# Patient Record
Sex: Male | Born: 1950
Health system: Southern US, Community
[De-identification: ages and names within clinical notes are randomized; demographics above are authoritative.]

## PROBLEM LIST (undated history)

## (undated) ENCOUNTER — Emergency Department (HOSPITAL_COMMUNITY): Admission: EM | Discharge status: Absent without leave | Payer: Medicare Other

## (undated) DIAGNOSIS — N200 Calculus of kidney: Secondary | ICD-10-CM

## (undated) DIAGNOSIS — S21109A Unspecified open wound of unspecified front wall of thorax without penetration into thoracic cavity, initial encounter: Secondary | ICD-10-CM

## (undated) DIAGNOSIS — Z87442 Personal history of urinary calculi: Secondary | ICD-10-CM

## (undated) DIAGNOSIS — G4733 Obstructive sleep apnea (adult) (pediatric): Principal | ICD-10-CM

## (undated) DIAGNOSIS — S2190XA Unspecified open wound of unspecified part of thorax, initial encounter: Secondary | ICD-10-CM

## (undated) DIAGNOSIS — Z87891 Personal history of nicotine dependence: Secondary | ICD-10-CM

## (undated) DIAGNOSIS — I509 Heart failure, unspecified: Secondary | ICD-10-CM

## (undated) HISTORY — PX: CARDIAC CATHETERIZATION: SHX172

## (undated) HISTORY — PX: OTHER SURGICAL HISTORY: SHX169

## (undated) HISTORY — DX: Obstructive sleep apnea (adult) (pediatric): G47.33

## (undated) HISTORY — PX: DENTAL SURGERY: SHX609

---

## 2017-10-13 ENCOUNTER — Inpatient Hospital Stay (HOSPITAL_COMMUNITY)
Admission: EM | Admit: 2017-10-13 | Discharge: 2017-10-21 | DRG: 287 | Disposition: A | Discharge status: Home / Self Care | Payer: Medicare Other | Attending: Internal Medicine | Admitting: Internal Medicine

## 2017-10-13 ENCOUNTER — Emergency Department (HOSPITAL_COMMUNITY): Payer: Medicare Other

## 2017-10-13 ENCOUNTER — Encounter (HOSPITAL_COMMUNITY): Payer: Self-pay | Admitting: Emergency Medicine

## 2017-10-13 DIAGNOSIS — R079 Chest pain, unspecified: Secondary | ICD-10-CM | POA: Diagnosis not present

## 2017-10-13 DIAGNOSIS — K7460 Unspecified cirrhosis of liver: Secondary | ICD-10-CM | POA: Diagnosis present

## 2017-10-13 DIAGNOSIS — E669 Obesity, unspecified: Secondary | ICD-10-CM | POA: Diagnosis not present

## 2017-10-13 DIAGNOSIS — Z87891 Personal history of nicotine dependence: Secondary | ICD-10-CM | POA: Diagnosis not present

## 2017-10-13 DIAGNOSIS — I272 Pulmonary hypertension, unspecified: Secondary | ICD-10-CM | POA: Diagnosis present

## 2017-10-13 DIAGNOSIS — R0602 Shortness of breath: Secondary | ICD-10-CM | POA: Diagnosis not present

## 2017-10-13 DIAGNOSIS — I5043 Acute on chronic combined systolic (congestive) and diastolic (congestive) heart failure: Secondary | ICD-10-CM | POA: Diagnosis not present

## 2017-10-13 DIAGNOSIS — I959 Hypotension, unspecified: Secondary | ICD-10-CM | POA: Diagnosis present

## 2017-10-13 DIAGNOSIS — R000 Tachycardia, unspecified: Secondary | ICD-10-CM | POA: Diagnosis not present

## 2017-10-13 DIAGNOSIS — I428 Other cardiomyopathies: Secondary | ICD-10-CM | POA: Diagnosis present

## 2017-10-13 DIAGNOSIS — Z6841 Body Mass Index (BMI) 40.0 and over, adult: Secondary | ICD-10-CM

## 2017-10-13 DIAGNOSIS — Z7982 Long term (current) use of aspirin: Secondary | ICD-10-CM

## 2017-10-13 DIAGNOSIS — R062 Wheezing: Secondary | ICD-10-CM | POA: Diagnosis not present

## 2017-10-13 DIAGNOSIS — R0789 Other chest pain: Secondary | ICD-10-CM | POA: Diagnosis not present

## 2017-10-13 DIAGNOSIS — I110 Hypertensive heart disease with heart failure: Secondary | ICD-10-CM | POA: Diagnosis not present

## 2017-10-13 DIAGNOSIS — R0689 Other abnormalities of breathing: Secondary | ICD-10-CM | POA: Diagnosis not present

## 2017-10-13 DIAGNOSIS — I509 Heart failure, unspecified: Secondary | ICD-10-CM

## 2017-10-13 DIAGNOSIS — R0683 Snoring: Secondary | ICD-10-CM | POA: Diagnosis present

## 2017-10-13 HISTORY — DX: Unspecified open wound of unspecified front wall of thorax without penetration into thoracic cavity, initial encounter: S21.109A

## 2017-10-13 HISTORY — DX: Unspecified open wound of unspecified part of thorax, initial encounter: S21.90XA

## 2017-10-13 HISTORY — DX: Calculus of kidney: N20.0

## 2017-10-13 HISTORY — DX: Personal history of nicotine dependence: Z87.891

## 2017-10-13 LAB — BASIC METABOLIC PANEL
Anion gap: 8 (ref 5–15)
BUN: 9 mg/dL (ref 8–23)
CALCIUM: 8.8 mg/dL — AB (ref 8.9–10.3)
CO2: 24 mmol/L (ref 22–32)
Chloride: 107 mmol/L (ref 98–111)
Creatinine, Ser: 0.87 mg/dL (ref 0.61–1.24)
GFR calc Af Amer: >60 mL/min (ref >60)
GLUCOSE: 137 mg/dL — AB (ref 70–99)
Potassium: 4.6 mmol/L (ref 3.5–5.1)
SODIUM: 139 mmol/L (ref 135–145)

## 2017-10-13 LAB — CBC
HCT: 44.7 % (ref 39.0–52.0)
Hemoglobin: 14.2 g/dL (ref 13.0–17.0)
MCH: 30.5 pg (ref 26.0–34.0)
MCHC: 31.8 g/dL (ref 30.0–36.0)
MCV: 95.9 fL (ref 78.0–100.0)
Platelets: 211 10*3/uL (ref 150–400)
RBC: 4.66 MIL/uL (ref 4.22–5.81)
RDW: 12.9 % (ref 11.5–15.5)
WBC: 6.7 10*3/uL (ref 4.0–10.5)

## 2017-10-13 LAB — I-STAT TROPONIN, ED: TROPONIN I, POC: 0.05 ng/mL (ref 0.00–0.08)

## 2017-10-13 NOTE — ED Provider Notes (Signed)
[facility] EMERGENCY DEPARTMENT Provider Note   CSN: [id number] Arrival date & time: 10/13/17  2246     History   Chief Complaint Chief Complaint  Patient presents with  . Chest Pain    HPI David Castaneda is a 67 y.o. male.  This patient is a 67 year old male with no significant past medical history.  He presents today for evaluation of chest discomfort.  He states that this is been occurring intermittently all week, then worsened this evening.  He describes a pressure in the front of his chest with associated shortness of breath, but no fever or cough.  He denies any pleuritic component.  He denies any recent exertional symptoms.  Patient tells me he has not seen a physician in nearly 30 years.  He is on no medications and has no cardiac risk factors.  He was given nitroglycerin with little relief, but did get significant relief with morphine by EMS.  The history is provided by the patient.  Chest Pain   This is a new problem. Episode onset: 1 week ago. Episode frequency: Intermittently. The problem has been gradually worsening. The pain is present in the substernal region. The pain is moderate. The quality of the pain is described as pressure-like. The pain does not radiate. Associated symptoms include shortness of breath. Pertinent negatives include no cough, no diaphoresis, no fever and no nausea. He has tried nothing for the symptoms. The treatment provided no relief. There are no known risk factors.  Pertinent negatives for past medical history include no CAD.    Past Medical History:  Diagnosis Date  . Sucking chest wound     There are no active problems to display for this patient.   History reviewed. No pertinent surgical history.      Home Medications    Prior to Admission medications   Not on File    Family History No family history on file.  Social History Social History   Tobacco Use  . Smoking status: Former Research scientist (life sciences)  . Smokeless  tobacco: Never Used  Substance Use Topics  . Alcohol use: Not Currently  . Drug use: Not Currently     Allergies   Patient has no known allergies.   Review of Systems Review of Systems  Constitutional: Negative for diaphoresis and fever.  Respiratory: Positive for shortness of breath. Negative for cough.   Cardiovascular: Positive for chest pain.  Gastrointestinal: Negative for nausea.  All other systems reviewed and are negative.    Physical Exam Updated Vital Signs BP 117/72   Pulse (!) 108   Temp 98.2 F (36.8 C) (Oral)   Resp 19   Ht 5\' 8"  (1.727 m)   Wt 117.9 kg (260 lb)   SpO2 94%   BMI 39.53 kg/m   Physical Exam  Constitutional: He is oriented to person, place, and time. He appears well-developed and well-nourished. No distress.  HENT:  Head: Normocephalic and atraumatic.  Mouth/Throat: Oropharynx is clear and moist.  Neck: Normal range of motion. Neck supple.  Cardiovascular: Normal rate and regular rhythm. Exam reveals no friction rub.  No murmur heard. Pulmonary/Chest: Effort normal and breath sounds normal. No respiratory distress. He has no wheezes. He has no rales.  Abdominal: Soft. Bowel sounds are normal. He exhibits no distension. There is no tenderness.  Musculoskeletal: Normal range of motion. He exhibits no edema.       Right lower leg: Normal. He exhibits no tenderness and no edema.  Left lower leg: Normal. He exhibits no tenderness and no edema.  Neurological: He is alert and oriented to person, place, and time. Coordination normal.  Skin: Skin is warm and dry. He is not diaphoretic.  Nursing note and vitals reviewed.    ED Treatments / Results  Labs (all labs ordered are listed, but only abnormal results are displayed) Labs Reviewed  CBC  BASIC METABOLIC PANEL  I-STAT TROPONIN, ED    EKG EKG Interpretation  Date/Time:  Friday October 13 2017 22:52:52 EDT Ventricular Rate:  111 PR Interval:    QRS Duration: 102 QT  Interval:  345 QTC Calculation: 469 R Axis:   5 Text Interpretation:  Sinus tachycardia Multiple ventricular premature complexes Anterior infarct, old Nonspecific T abnormalities, lateral leads Confirmed by Veryl Speak [phone]) on 10/13/2017 11:06:40 PM   Radiology Dg Chest 2 View  Result Date: 10/13/2017 CLINICAL DATA:  67 year old male with chest pain. EXAM: CHEST - 2 VIEW COMPARISON:  None FINDINGS: There is mild cardiomegaly with mild vascular congestion and small bilateral pleural effusions. No focal consolidation, or pneumothorax. No acute osseous pathology. IMPRESSION: Cardiomegaly with mild CHF and small bilateral pleural effusions. Clinical correlation is recommended. Electronically Signed   By: Anner Crete M.D.   On: 10/13/2017 23:29    Procedures Procedures (including critical care time)  Medications Ordered in ED Medications - No data to display   Initial Impression / Assessment and Plan / ED Course  I have reviewed the triage vital signs and the nursing notes.  Pertinent labs & imaging results that were available during my care of the patient were reviewed by me and considered in my medical decision making (see chart for details).  Patient with no prior cardiac history presenting with complaints of chest discomfort.  This started earlier this week and is worsening.  His work-up this evening and thus far unremarkable.  His troponin is negative and EKG shows only nonspecific findings.  As this patient has not seen a physician in nearly 30 years and I have no knowledge of his cardiac status, he will be admitted to the hospitalist service for rule out of MI and possibly further work-up. Dr. Blaine Hamper agrees to admit.  Final Clinical Impressions(s) / ED Diagnoses   Final diagnoses:  None    ED Discharge Orders    None       Veryl Speak, MD 10/14/17 2322

## 2017-10-13 NOTE — ED Notes (Signed)
BIB EMS from home, pt reports central CP X1 week, non radiating, heavy in nature. SOB, weakness, diaphoresis. Given 324 ASA, 2NTG, 6mg  Morphine, pain went from 10/10 to 3/10. Pt has not been to a doctor in 30 years.

## 2017-10-13 NOTE — ED Notes (Signed)
Patient transported to X-ray 

## 2017-10-13 NOTE — ED Notes (Signed)
ED Provider at bedside. 

## 2017-10-14 ENCOUNTER — Other Ambulatory Visit: Payer: Self-pay

## 2017-10-14 ENCOUNTER — Encounter (HOSPITAL_COMMUNITY): Payer: Self-pay | Admitting: Internal Medicine

## 2017-10-14 ENCOUNTER — Observation Stay (HOSPITAL_COMMUNITY): Payer: Medicare Other

## 2017-10-14 DIAGNOSIS — I200 Unstable angina: Secondary | ICD-10-CM

## 2017-10-14 DIAGNOSIS — Z87891 Personal history of nicotine dependence: Secondary | ICD-10-CM | POA: Diagnosis not present

## 2017-10-14 DIAGNOSIS — I110 Hypertensive heart disease with heart failure: Secondary | ICD-10-CM | POA: Diagnosis not present

## 2017-10-14 DIAGNOSIS — Z7982 Long term (current) use of aspirin: Secondary | ICD-10-CM | POA: Diagnosis not present

## 2017-10-14 DIAGNOSIS — R0683 Snoring: Secondary | ICD-10-CM | POA: Diagnosis not present

## 2017-10-14 DIAGNOSIS — R0602 Shortness of breath: Secondary | ICD-10-CM | POA: Diagnosis not present

## 2017-10-14 DIAGNOSIS — E669 Obesity, unspecified: Secondary | ICD-10-CM | POA: Diagnosis not present

## 2017-10-14 DIAGNOSIS — I509 Heart failure, unspecified: Secondary | ICD-10-CM

## 2017-10-14 DIAGNOSIS — R7989 Other specified abnormal findings of blood chemistry: Secondary | ICD-10-CM | POA: Diagnosis not present

## 2017-10-14 DIAGNOSIS — R079 Chest pain, unspecified: Secondary | ICD-10-CM | POA: Diagnosis not present

## 2017-10-14 DIAGNOSIS — I272 Pulmonary hypertension, unspecified: Secondary | ICD-10-CM | POA: Diagnosis not present

## 2017-10-14 DIAGNOSIS — I428 Other cardiomyopathies: Secondary | ICD-10-CM | POA: Diagnosis not present

## 2017-10-14 DIAGNOSIS — I5043 Acute on chronic combined systolic (congestive) and diastolic (congestive) heart failure: Secondary | ICD-10-CM | POA: Diagnosis not present

## 2017-10-14 DIAGNOSIS — I959 Hypotension, unspecified: Secondary | ICD-10-CM | POA: Diagnosis not present

## 2017-10-14 DIAGNOSIS — Z6841 Body Mass Index (BMI) 40.0 and over, adult: Secondary | ICD-10-CM | POA: Diagnosis not present

## 2017-10-14 DIAGNOSIS — K7460 Unspecified cirrhosis of liver: Secondary | ICD-10-CM | POA: Diagnosis not present

## 2017-10-14 LAB — LIPID PANEL
Cholesterol: 141 mg/dL (ref 0–200)
HDL: 47 mg/dL (ref >40)
LDL CALC: 85 mg/dL (ref 0–99)
Total CHOL/HDL Ratio: 3 RATIO
Triglycerides: 44 mg/dL (ref <150)
VLDL: 9 mg/dL (ref 0–40)

## 2017-10-14 LAB — HEPARIN LEVEL (UNFRACTIONATED)
HEPARIN UNFRACTIONATED: 0.44 [IU]/mL (ref 0.30–0.70)
Heparin Unfractionated: 0.23 IU/mL — ABNORMAL LOW (ref 0.30–0.70)

## 2017-10-14 LAB — RAPID URINE DRUG SCREEN, HOSP PERFORMED
AMPHETAMINES: NOT DETECTED
BENZODIAZEPINES: NOT DETECTED
Cocaine: NOT DETECTED
Opiates: POSITIVE — AB
TETRAHYDROCANNABINOL: NOT DETECTED

## 2017-10-14 LAB — D-DIMER, QUANTITATIVE (NOT AT ARMC): D DIMER QUANT: 1.09 ug{FEU}/mL — AB (ref 0.00–0.50)

## 2017-10-14 LAB — TROPONIN I
Troponin I: 0.08 ng/mL (ref <0.03)
Troponin I: 0.11 ng/mL (ref <0.03)
Troponin I: 0.12 ng/mL (ref <0.03)

## 2017-10-14 LAB — HEMOGLOBIN A1C
HEMOGLOBIN A1C: 5.3 % (ref 4.8–5.6)
MEAN PLASMA GLUCOSE: 105.41 mg/dL

## 2017-10-14 LAB — BASIC METABOLIC PANEL
ANION GAP: 13 (ref 5–15)
BUN: 12 mg/dL (ref 8–23)
CHLORIDE: 101 mmol/L (ref 98–111)
CO2: 25 mmol/L (ref 22–32)
Calcium: 9.2 mg/dL (ref 8.9–10.3)
Creatinine, Ser: 0.98 mg/dL (ref 0.61–1.24)
GFR calc non Af Amer: >60 mL/min (ref >60)
Glucose, Bld: 107 mg/dL — ABNORMAL HIGH (ref 70–99)
POTASSIUM: 3.7 mmol/L (ref 3.5–5.1)
SODIUM: 139 mmol/L (ref 135–145)

## 2017-10-14 LAB — MAGNESIUM: Magnesium: 1.7 mg/dL (ref 1.7–2.4)

## 2017-10-14 LAB — BRAIN NATRIURETIC PEPTIDE: B NATRIURETIC PEPTIDE 5: 455.7 pg/mL — AB (ref 0.0–100.0)

## 2017-10-14 LAB — HIV ANTIBODY (ROUTINE TESTING W REFLEX): HIV SCREEN 4TH GENERATION: NONREACTIVE

## 2017-10-14 MED ORDER — FAMOTIDINE 20 MG PO TABS
20.0000 mg | ORAL_TABLET | Freq: Every day | ORAL | Status: DC
  Administered 2017-10-14 – 2017-10-20 (×7): 20 mg via ORAL
  Filled 2017-10-14 (×7): qty 1

## 2017-10-14 MED ORDER — HYDRALAZINE HCL 20 MG/ML IJ SOLN: 5.0000 mg | INTRAMUSCULAR | Status: DC | PRN

## 2017-10-14 MED ORDER — ENOXAPARIN SODIUM 40 MG/0.4ML ~~LOC~~ SOLN
40.0000 mg | SUBCUTANEOUS | Status: DC
  Filled 2017-10-14: qty 0.4

## 2017-10-14 MED ORDER — ONDANSETRON HCL 4 MG/2ML IJ SOLN: 4.0000 mg | Freq: Four times a day (QID) | INTRAMUSCULAR | Status: DC | PRN

## 2017-10-14 MED ORDER — ACETAMINOPHEN 325 MG PO TABS: 650.0000 mg | ORAL_TABLET | Freq: Four times a day (QID) | ORAL | Status: DC | PRN

## 2017-10-14 MED ORDER — MORPHINE SULFATE (PF) 4 MG/ML IV SOLN: 2.0000 mg | INTRAVENOUS | Status: DC | PRN

## 2017-10-14 MED ORDER — ATORVASTATIN CALCIUM 80 MG PO TABS
80.0000 mg | ORAL_TABLET | Freq: Every day | ORAL | Status: DC
  Administered 2017-10-14 – 2017-10-16 (×4): 80 mg via ORAL
  Filled 2017-10-14 (×4): qty 1

## 2017-10-14 MED ORDER — IPRATROPIUM BROMIDE 0.02 % IN SOLN: 0.5000 mg | Freq: Two times a day (BID) | RESPIRATORY_TRACT | Status: DC | PRN

## 2017-10-14 MED ORDER — IPRATROPIUM BROMIDE 0.02 % IN SOLN: 0.5000 mg | Freq: Two times a day (BID) | RESPIRATORY_TRACT | Status: DC

## 2017-10-14 MED ORDER — IOPAMIDOL (ISOVUE-300) INJECTION 61%: 100.0000 mL | Freq: Once | INTRAVENOUS | Status: DC | PRN

## 2017-10-14 MED ORDER — HEPARIN (PORCINE) IN NACL 100-0.45 UNIT/ML-% IJ SOLN
1600.0000 [IU]/h | INTRAMUSCULAR | Status: DC
  Administered 2017-10-14: 1400 [IU]/h via INTRAVENOUS
  Administered 2017-10-14 – 2017-10-16 (×3): 1600 [IU]/h via INTRAVENOUS
  Filled 2017-10-14 (×4): qty 250

## 2017-10-14 MED ORDER — IPRATROPIUM BROMIDE 0.02 % IN SOLN
0.5000 mg | Freq: Four times a day (QID) | RESPIRATORY_TRACT | Status: DC
  Administered 2017-10-14: 0.5 mg via RESPIRATORY_TRACT
  Filled 2017-10-14: qty 2.5

## 2017-10-14 MED ORDER — FUROSEMIDE 10 MG/ML IJ SOLN
20.0000 mg | Freq: Once | INTRAMUSCULAR | Status: AC
  Administered 2017-10-14: 20 mg via INTRAVENOUS
  Filled 2017-10-14: qty 2

## 2017-10-14 MED ORDER — CALCIUM CARBONATE ANTACID 500 MG PO CHEW
1.0000 | CHEWABLE_TABLET | Freq: Two times a day (BID) | ORAL | Status: DC | PRN
  Administered 2017-10-14 – 2017-10-15 (×2): 200 mg via ORAL
  Filled 2017-10-14 (×2): qty 1

## 2017-10-14 MED ORDER — FUROSEMIDE 10 MG/ML IJ SOLN
40.0000 mg | Freq: Every day | INTRAMUSCULAR | Status: DC
  Administered 2017-10-14 – 2017-10-15 (×3): 40 mg via INTRAVENOUS
  Filled 2017-10-14 (×3): qty 4

## 2017-10-14 MED ORDER — ZOLPIDEM TARTRATE 5 MG PO TABS: 5.0000 mg | ORAL_TABLET | Freq: Every evening | ORAL | Status: DC | PRN

## 2017-10-14 MED ORDER — LEVALBUTEROL HCL 1.25 MG/0.5ML IN NEBU
1.2500 mg | INHALATION_SOLUTION | Freq: Four times a day (QID) | RESPIRATORY_TRACT | Status: DC
  Administered 2017-10-14: 1.25 mg via RESPIRATORY_TRACT
  Filled 2017-10-14 (×3): qty 0.5

## 2017-10-14 MED ORDER — IOPAMIDOL (ISOVUE-370) INJECTION 76%
INTRAVENOUS | Status: AC
  Administered 2017-10-14: 100 mL
  Filled 2017-10-14: qty 100

## 2017-10-14 MED ORDER — LEVALBUTEROL HCL 1.25 MG/0.5ML IN NEBU: 1.2500 mg | INHALATION_SOLUTION | Freq: Two times a day (BID) | RESPIRATORY_TRACT | Status: DC | PRN

## 2017-10-14 MED ORDER — ASPIRIN 81 MG PO CHEW
324.0000 mg | CHEWABLE_TABLET | Freq: Every day | ORAL | Status: DC
  Administered 2017-10-14 – 2017-10-15 (×2): 324 mg via ORAL
  Filled 2017-10-14 (×2): qty 4

## 2017-10-14 MED ORDER — NITROGLYCERIN 0.4 MG SL SUBL: 0.4000 mg | SUBLINGUAL_TABLET | SUBLINGUAL | Status: DC | PRN

## 2017-10-14 MED ORDER — LEVALBUTEROL HCL 1.25 MG/0.5ML IN NEBU: 1.2500 mg | INHALATION_SOLUTION | Freq: Two times a day (BID) | RESPIRATORY_TRACT | Status: DC

## 2017-10-14 MED ORDER — DM-GUAIFENESIN ER 30-600 MG PO TB12
1.0000 | ORAL_TABLET | Freq: Two times a day (BID) | ORAL | Status: DC | PRN
Start: 2017-10-14 — End: 2017-10-21
  Administered 2017-10-14: 1 via ORAL
  Filled 2017-10-14: qty 1

## 2017-10-14 MED ORDER — HEPARIN BOLUS VIA INFUSION
4000.0000 [IU] | Freq: Once | INTRAVENOUS | Status: AC
  Administered 2017-10-14: 4000 [IU] via INTRAVENOUS
  Filled 2017-10-14: qty 4000

## 2017-10-14 NOTE — Progress Notes (Signed)
ANTICOAGULATION CONSULT NOTE - Initial Consult  Pharmacy Consult for heparin Indication: chest pain/ACS  No Known Allergies  Patient Measurements: Height: 5\' 8"  (172.7 cm) Weight: 281 lb 3.2 oz (127.6 kg)(scale B) IBW/kg (Calculated) : 68.4 Heparin Dosing Weight: 100kg  Vital Signs: Temp: 98.4 F (36.9 C) (07/13 0212) Temp Source: Oral (07/13 0212) BP: 131/97 (07/13 0212) Pulse Rate: 116 (07/13 0212)  Labs: Recent Labs    10/13/17 2300 10/14/17 0112  HGB 14.2  --   HCT 44.7  --   PLT 211  --   CREATININE 0.87  --   TROPONINI  --  0.08*    Estimated Creatinine Clearance: 104.4 mL/min (by C-G formula based on SCr of 0.87 mg/dL).   Medical History: Past Medical History:  Diagnosis Date  . Former smoker   . Kidney stone   . Sucking chest wound     Medications:  Medications Prior to Admission  Medication Sig Dispense Refill Last Dose  . acetaminophen (TYLENOL) 500 MG tablet Take 500 mg by mouth every 6 (six) hours as needed for mild pain or fever.   10/13/2017 at Unknown time  . aspirin 81 MG chewable tablet Chew 81 mg by mouth as needed for mild pain.   10/13/2017 at Unknown time  . ePHEDrine-guaiFENesin (BRONKAID) 25-400 MG TABS Take 1 tablet by mouth 4 (four) times daily.   10/13/2017 at Unknown time   Scheduled:  . iopamidol      . aspirin  324 mg Oral Daily  . ipratropium  0.5 mg Nebulization Q6H  . levalbuterol  1.25 mg Nebulization Q6H     Assessment: 67yo male c/o CP x1wk but worsened today, associated w/ SOB, weakness, and diaphoresis, initial troponin negative but now increasing, to begin heparin.  Goal of Therapy:  Heparin level 0.3-0.7 units/ml Monitor platelets by anticoagulation protocol: Yes   Plan:  Will give heparin 4000 units IV bolus x1 followed by gtt at 1400 units/hr and monitor heparin levels and CBC.  Wynona Neat, PharmD, BCPS  10/14/2017,3:43 AM

## 2017-10-14 NOTE — H&P (Addendum)
History and Physical    David Castaneda BZJ:[id number] DOB: 09/03/1950 DOA: 10/13/2017  Referring MD/NP/PA:   PCP: Patient, No Pcp Per   Patient coming from:  The patient is coming from home.  At baseline, pt is independent for most of ADL.   Chief Complaint: Chest pain and SOB, ankle edema  HPI: David Castaneda is a 67 y.o. male with medical history significant of kidney stone, obesity, former smoker, who presents with chest pain.  Pt states that he is generally healthy, did not see Dr. in the past 30 years. He states that he has been having intermittent chest pain for about 1 week, which has worsened today.  The chest pain is located in the front chest, pressure-like, nonradiating, 10 out of 10 in severity sometimes, currently 3 out of 10 severity, nonradiating.  No recent long distance traveling.  No cough, fever or chills.  He has nausea, diaphoresis sometimes, no vomiting, diarrhea or abdominal pain.  Denies symptoms of UTI or unilateral weakness.  Patient states that his shortness of breath is worse when he is laying down, and better in sitting position. He has mild bilateral ankle edema.  ED Course: pt was found to have negative troponin, WBC 6.7, electrolytes renal function okay, temperature normal, tachycardia, oxygen saturation 92 to 95% on 2 L nasal cannula oxygen.  Chest x-ray showed mild pulmonary edema and cardiomegaly, bilateral small pleural effusion.  Patient is placed on telemetry bed for observation.  Review of Systems:   General: no fevers, chills, no body weight gain, has fatigue HEENT: no blurry vision, hearing changes or sore throat Respiratory: has dyspnea, no coughing, wheezing CV: no chest pain, no palpitations GI: no nausea, vomiting, abdominal pain, diarrhea, constipation GU: no dysuria, burning on urination, increased urinary frequency, hematuria  Ext: has leg edema Neuro: no unilateral weakness, numbness, or tingling, no vision change or hearing loss Skin: no rash,  no skin tear. MSK: No muscle spasm, no deformity, no limitation of range of movement in spin Heme: No easy bruising.  Travel history: No recent long distant travel.  Allergy: No Known Allergies  Past Medical History:  Diagnosis Date  . Former smoker   . Kidney stone   . Sucking chest wound     Past Surgical History:  Procedure Laterality Date  . wound     wound in back in war    Social History:  reports that he has quit smoking. He has never used smokeless tobacco. He reports that he drank alcohol. He reports that he does not use drugs.  Family History:  Family History  Problem Relation Age of Onset  . Dementia Mother   . Heart disease Father   . Lung cancer Brother        SCLC     Prior to Admission medications   Medication Sig Start Date End Date Taking? Authorizing Provider  acetaminophen (TYLENOL) 500 MG tablet Take 500 mg by mouth every 6 (six) hours as needed for mild pain or fever.   Yes [provider]  aspirin 81 MG chewable tablet Chew 81 mg by mouth as needed for mild pain.   Yes [provider]  ePHEDrine-guaiFENesin (BRONKAID) 25-400 MG TABS Take 1 tablet by mouth 4 (four) times daily.   Yes [provider]    Physical Exam: Vitals:   10/13/17 2345 10/14/17 0015 10/14/17 0045 10/14/17 0100  BP: 114/78 112/66 119/65 120/70  Pulse: (!) 105 (!) 103 (!) 102 (!) 106  Resp: 17 19  16 20  Temp:      TempSrc:      SpO2: 95% 96% 95% 97%  Weight:      Height:       General: Not in acute distress HEENT:       Eyes: PERRL, EOMI, no scleral icterus.       ENT: No discharge from the ears and nose, no pharynx injection, no tonsillar enlargement.        Neck: No JVD, no bruit, no mass felt. Heme: No neck lymph node enlargement. Cardiac: S1/S2, RRR, No murmurs, No gallops or rubs. Respiratory: Has fine crackles bilaterally GI: Soft, nondistended, nontender, no rebound pain, no organomegaly, BS present. GU: No hematuria Ext: has mild  ankle edema bilaterally. 2+DP/PT pulse bilaterally. Musculoskeletal: No joint deformities, No joint redness or warmth, no limitation of ROM in spin. Skin: No rashes.  Neuro: Alert, oriented X3, cranial nerves II-XII grossly intact, moves all extremities normally. Psych: Patient is not psychotic, no suicidal or hemocidal ideation.  Labs on Admission: I have personally reviewed following labs and imaging studies  CBC: Recent Labs  Lab 10/13/17 2300  WBC 6.7  HGB 14.2  HCT 44.7  MCV 95.9  PLT 299   Basic Metabolic Panel: Recent Labs  Lab 10/13/17 2300  NA 139  K 4.6  CL 107  CO2 24  GLUCOSE 137*  BUN 9  CREATININE 0.87  CALCIUM 8.8*   GFR: Estimated Creatinine Clearance: 100 mL/min (by C-G formula based on SCr of 0.87 mg/dL). Liver Function Tests: No results for input(s): AST, ALT, ALKPHOS, BILITOT, PROT, ALBUMIN in the last 168 hours. No results for input(s): LIPASE, AMYLASE in the last 168 hours. No results for input(s): AMMONIA in the last 168 hours. Coagulation Profile: No results for input(s): INR, PROTIME in the last 168 hours. Cardiac Enzymes: No results for input(s): CKTOTAL, CKMB, CKMBINDEX, TROPONINI in the last 168 hours. BNP (last 3 results) No results for input(s): PROBNP in the last 8760 hours. HbA1C: No results for input(s): HGBA1C in the last 72 hours. CBG: No results for input(s): GLUCAP in the last 168 hours. Lipid Profile: No results for input(s): CHOL, HDL, LDLCALC, TRIG, CHOLHDL, LDLDIRECT in the last 72 hours. Thyroid Function Tests: No results for input(s): TSH, T4TOTAL, FREET4, T3FREE, THYROIDAB in the last 72 hours. Anemia Panel: No results for input(s): VITAMINB12, FOLATE, FERRITIN, TIBC, IRON, RETICCTPCT in the last 72 hours. Urine analysis: No results found for: COLORURINE, APPEARANCEUR, LABSPEC, PHURINE, GLUCOSEU, HGBUR, BILIRUBINUR, KETONESUR, PROTEINUR, UROBILINOGEN, NITRITE, LEUKOCYTESUR Sepsis  Labs: @LABRCNTIP (procalcitonin:4,lacticidven:4) )No results found for this or any previous visit (from the past 240 hour(s)).   Radiological Exams on Admission: Dg Chest 2 View  Result Date: 10/13/2017 CLINICAL DATA:  67 year old male with chest pain. EXAM: CHEST - 2 VIEW COMPARISON:  None FINDINGS: There is mild cardiomegaly with mild vascular congestion and small bilateral pleural effusions. No focal consolidation, or pneumothorax. No acute osseous pathology. IMPRESSION: Cardiomegaly with mild CHF and small bilateral pleural effusions. Clinical correlation is recommended. Electronically Signed   By: Anner Crete M.D.   On: 10/13/2017 23:29     EKG: Independently reviewed.  Sinus rhythm, QTC 469, PVC, LAE, anteroseptal infarction pattern, poor R wave progression  Assessment/Plan Principal Problem:   Chest pain Active Problems:   SOB (shortness of breath)   Chest pain SOB: Differential diagnosis to include coronary artery disease given old age and obesity, and acute CHF given bilateral leg edema, pulmonary edema chest x-ray and crackles on  auscultation. PE also need to be ruled out. Pending D-dimer and BNP.  - will place on Tele bed for obs - cycle CE q6 x3 and repeat EKG in the am  - prn Nitroglycerin, Morphine, and aspirin - Atrovent nebs and prn albuterol nebs - will give one dose of lasix 20 mg by IV now - Risk factor stratification: will check FLP,UDS and A1C  - 2d echo - Stat D-dimer, if positive, will get CTA to r/o PE - inpt card consult was requested via Epic  Addendum-1: D-dime 1.09 and trop becomes positive 0.08. Pt still has mild CP and discomfort. Suspect NSTEMI/unstable angina. -will start IV heparin -will get stat CTA to r/o PE -start lipitor 80 mg daily  Addendum-2: CTA negative for PE. Pt likely has acute CHF. Pending 2d echo for which type of CHF. -will start lasix 40 mg daily -check Mg and BMP at 10:00 aM   DVT ppx: SQ Lovenox Code Status: Full  code Family Communication:  Yes, patient's lady friend at bed side Disposition Plan:  Anticipate discharge back to previous home environment Consults called:  none Admission status: Obs / tele     Date of Service 10/14/2017    Ivor Costa [facility] Pager [phone]  If 7PM-7AM, please contact night-coverage [email or web address] Password [email or web address] 10/14/2017, 1:32 AM

## 2017-10-14 NOTE — Progress Notes (Signed)
Subjective:  Patient admitted this morning, see detailed H&P by Dr Blaine Hamper 67 year old male with history of kidney stone, obesity, former smoker came to the hospital with chest pressure.  Patient has not seen a physician past 30 years.  Has been having intermittent chest pain for about a week.  Also found to have elevated troponin 0.08, 0.11, 0.12, chest x-ray showed mild pulmonary edema and cardiomegaly.  D-dimer was elevated to 1.09, CTA chest obtained which was negative for PE.  BNP was elevated to 455.7.  Vitals:   10/14/17 0951 10/14/17 1214  BP:  114/71  Pulse: (!) 102 98  Resp: 18 20  Temp:  (!) 97.5 F (36.4 C)  SpO2: 94% 98%      A/P Chest pain Dyspnea  Patient started on IV Lasix 40 mg daily. Cardiology has seen the patient and plan for left and right heart cath on Monday.     [place] Hospitalist [phone]

## 2017-10-14 NOTE — Progress Notes (Signed)
ANTICOAGULATION CONSULT NOTE - Follow Up Consult  Pharmacy Consult for Heparin Indication: chest pain/ACS  No Known Allergies  Patient Measurements: Height: 5\' 8"  (172.7 cm) Weight: 281 lb 3.2 oz (127.6 kg)(scale B) IBW/kg (Calculated) : 68.4 Heparin Dosing Weight: 100 kg  Vital Signs: Temp: 97.5 F (36.4 C) (07/13 1214) Temp Source: Oral (07/13 1214) BP: 114/71 (07/13 1214) Pulse Rate: 98 (07/13 1214)  Labs: Recent Labs    10/13/17 2300 10/14/17 0112 10/14/17 0637 10/14/17 1130  HGB 14.2  --   --   --   HCT 44.7  --   --   --   PLT 211  --   --   --   HEPARINUNFRC  --   --   --  0.23*  CREATININE 0.87  --   --   --   TROPONINI  --  0.08* 0.11*  --     Estimated Creatinine Clearance: 104.4 mL/min (by C-G formula based on SCr of 0.87 mg/dL).  Assessment:  67 yr old male on IV heparin for chest pain/ACS.  Cath planned for 7/15.   Initial heparin level is subtherapeutic (0.23) on 1400 units/hr.   Goal of Therapy:  Heparin level 0.3-0.7 units/ml Monitor platelets by anticoagulation protocol: Yes   Plan:   Increase heparin drip to 1600 units/hr.  Heparin level ~6 hrs after increase.  Daily heparin level and CBC while on heparin.  Arty Baumgartner, [place] Pager: [phone] or phone: [phone] 10/14/2017,12:17 PM

## 2017-10-14 NOTE — Progress Notes (Signed)
[facility] for heparin Indication: chest pain/ACS  No Known Allergies  Patient Measurements: Height: 5\' 8"  (172.7 cm) Weight: 281 lb 3.2 oz (127.6 kg)(scale B) IBW/kg (Calculated) : 68.4 Heparin Dosing Weight: 100kg  Vital Signs: Temp: 98.1 F (36.7 C) (07/13 1938) Temp Source: Oral (07/13 1938) BP: 128/69 (07/13 1938) Pulse Rate: 94 (07/13 1938)  Labs: Recent Labs    10/13/17 2300 10/14/17 0112 10/14/17 0637 10/14/17 1130 10/14/17 1818  HGB 14.2  --   --   --   --   HCT 44.7  --   --   --   --   PLT 211  --   --   --   --   HEPARINUNFRC  --   --   --  0.23* 0.44  CREATININE 0.87  --   --  0.98  --   TROPONINI  --  0.08* 0.11* 0.12*  --     Estimated Creatinine Clearance: 92.7 mL/min (by C-G formula based on SCr of 0.98 mg/dL).   Medical History: Past Medical History:  Diagnosis Date  . Former smoker   . Kidney stone   . Sucking chest wound     Medications:  Medications Prior to Admission  Medication Sig Dispense Refill Last Dose  . acetaminophen (TYLENOL) 500 MG tablet Take 500 mg by mouth every 6 (six) hours as needed for mild pain or fever.   10/13/2017 at Unknown time  . aspirin 81 MG chewable tablet Chew 81 mg by mouth as needed for mild pain.   10/13/2017 at Unknown time  . ePHEDrine-guaiFENesin (BRONKAID) 25-400 MG TABS Take 1 tablet by mouth 4 (four) times daily.   10/13/2017 at Unknown time   Scheduled:  . aspirin  324 mg Oral Daily  . atorvastatin  80 mg Oral q1800  . furosemide  40 mg Intravenous Daily    Assessment: 67yo male c/o CP x1wk but worsened today, associated w/ SOB, weakness, and diaphoresis.   Heparin level is therapeutic at 0.44, on 1600 units/hr. CBC stable. No s/sx of bleeding. No infusion issues.   Goal of Therapy:  Heparin level 0.3-0.7 units/ml Monitor platelets by anticoagulation protocol: Yes   Plan:  Continue heparin infusion at 1600 units/hr Monitor daily heparin level, CBC, and  for s/sx of bleeding  Doylene Canard, PharmD Clinical Pharmacist  Pager: [phone] Phone: [phone] 10/14/2017,8:33 PM

## 2017-10-14 NOTE — Consult Note (Signed)
Cardiology Consultation:   Patient ID: David Castaneda; [id number]; [birth year]   Admit date: 10/13/2017 Date of Consult: 10/14/2017  Primary Care Provider: Patient, No Pcp Per Primary Cardiologist: No primary care provider on file. new Primary Electrophysiologist:  none   Patient Profile:   David Castaneda is a 67 y.o. male with a hx of obesity  who is being seen today for the evaluation of sob and chest pressure at the request of Dr. Blaine Hamper.  History of Present Illness:   David Castaneda is an obese 67 yo man with a h/o remote tobacco abuse, kidney stones, who was admitted with chest pressure and sob. His symptoms are described as non-radiating, not exertional but better when he sits up, worse when he lies down and associated with sob. No nausea or diaphoresis. He has had some peripheral edema.  His initial troponin was minimally elevated along with an elevated D-dimer and CT of the chest demonstrates no PE.   Past Medical History:  Diagnosis Date  . Former smoker   . Kidney stone   . Sucking chest wound     Past Surgical History:  Procedure Laterality Date  . wound     wound in [place] in [place]     Home Medications:  Prior to Admission medications   Medication Sig Start Date End Date Taking? Authorizing Provider  acetaminophen (TYLENOL) 500 MG tablet Take 500 mg by mouth every 6 (six) hours as needed for mild pain or fever.   Yes [provider]  aspirin 81 MG chewable tablet Chew 81 mg by mouth as needed for mild pain.   Yes [provider]  ePHEDrine-guaiFENesin (BRONKAID) 25-400 MG TABS Take 1 tablet by mouth 4 (four) times daily.   Yes [provider]    Inpatient Medications: Scheduled Meds: . aspirin  324 mg Oral Daily  . atorvastatin  80 mg Oral q1800  . furosemide  40 mg Intravenous Daily  . ipratropium  0.5 mg Nebulization BID  . levalbuterol  1.25 mg Nebulization BID   Continuous Infusions: . heparin 1,400 Units/hr (10/14/17 0500)   PRN  Meds: acetaminophen, dextromethorphan-guaiFENesin, hydrALAZINE, iopamidol, morphine injection, nitroGLYCERIN, ondansetron (ZOFRAN) IV, zolpidem  Allergies:   No Known Allergies  Social History:   Social History   Socioeconomic History  . Marital status: Married    Spouse name: Not on file  . Number of children: Not on file  . Years of education: Not on file  . Highest education level: Not on file  Occupational History  . Not on file  Social Needs  . Financial resource strain: Not on file  . Food insecurity:    Worry: Not on file    Inability: Not on file  . Transportation needs:    Medical: Not on file    Non-medical: Not on file  Tobacco Use  . Smoking status: Former Research scientist (life sciences)  . Smokeless tobacco: Never Used  Substance and Sexual Activity  . Alcohol use: Not Currently  . Drug use: Never  . Sexual activity: Not on file  Lifestyle  . Physical activity:    Days per week: Not on file    Minutes per session: Not on file  . Stress: Not on file  Relationships  . Social connections:    Talks on phone: Not on file    Gets together: Not on file    Attends religious service: Not on file    Active member of club or organization: Not on file    Attends  meetings of clubs or organizations: Not on file    Relationship status: Not on file  . Intimate partner violence:    Fear of current or ex partner: Not on file    Emotionally abused: Not on file    Physically abused: Not on file    Forced sexual activity: Not on file  Other Topics Concern  . Not on file  Social History Narrative  . Not on file    Family History:    Family History  Problem Relation Age of Onset  . Dementia Mother   . Heart disease Father   . Lung cancer Brother        SCLC     ROS:  Please see the history of present illness.   All other ROS reviewed and negative.     Physical Exam/Data:   Vitals:   10/14/17 0115 10/14/17 0200 10/14/17 0212 10/14/17 0951  BP: 107/68  (!) 131/97   Pulse: (!) 103   (!) 116 (!) 102  Resp: 16  20 18   Temp:   98.4 F (36.9 C)   TempSrc:   Oral   SpO2: 96%  93% 94%  Weight:  281 lb 3.2 oz (127.6 kg)    Height:  5\' 8"  (1.727 m)      Intake/Output Summary (Last 24 hours) at 10/14/2017 1036 Last data filed at 10/14/2017 0922 Gross per 24 hour  Intake 43.43 ml  Output 1300 ml  Net -1256.57 ml   Filed Weights   10/13/17 2253 10/14/17 0200  Weight: 260 lb (117.9 kg) 281 lb 3.2 oz (127.6 kg)   Body mass index is 42.76 kg/m.  General:  Well nourished, obesewell developed, in no acute distress HEENT: normal Lymph: no adenopathy Neck: no JVD Endocrine:  No thryomegaly Vascular: No carotid bruits; FA pulses 2+ bilaterally without bruits  Cardiac:  normal S1, S2; RRR; no murmur  Lungs:  clear to auscultation bilaterally, no wheezing, rhonchi or rales  Abd: soft, nontender, no hepatomegaly  Ext: no edema Musculoskeletal:  No deformities, BUE and BLE strength normal and equal Skin: warm and dry  Neuro:  CNs 2-12 intact, no focal abnormalities noted Psych:  Normal affect   EKG:  The EKG was personally reviewed and demonstrates:  nsr with poor R wave progression, cannot rule out prior MI, left axis Telemetry:  Telemetry was personally reviewed and demonstrates:  nsr  Relevant CV Studies: 2D echo is pending  Laboratory Data:  Chemistry Recent Labs  Lab 10/13/17 2300  NA 139  K 4.6  CL 107  CO2 24  GLUCOSE 137*  BUN 9  CREATININE 0.87  CALCIUM 8.8*  GFRNONAA >60  GFRAA >60  ANIONGAP 8    No results for input(s): PROT, ALBUMIN, AST, ALT, ALKPHOS, BILITOT in the last 168 hours. Hematology Recent Labs  Lab 10/13/17 2300  WBC 6.7  RBC 4.66  HGB 14.2  HCT 44.7  MCV 95.9  MCH 30.5  MCHC 31.8  RDW 12.9  PLT 211   Cardiac Enzymes Recent Labs  Lab 10/14/17 0112 10/14/17 0637  TROPONINI 0.08* 0.11*    Recent Labs  Lab 10/13/17 2305  TROPIPOC 0.05    BNP Recent Labs  Lab 10/14/17 0113  BNP 455.7*    DDimer  Recent  Labs  Lab 10/14/17 0112  DDIMER 1.09*    Radiology/Studies:  Dg Chest 2 View  Result Date: 10/13/2017 CLINICAL DATA:  67 year old male with chest pain. EXAM: CHEST - 2 VIEW COMPARISON:  None  FINDINGS: There is mild cardiomegaly with mild vascular congestion and small bilateral pleural effusions. No focal consolidation, or pneumothorax. No acute osseous pathology. IMPRESSION: Cardiomegaly with mild CHF and small bilateral pleural effusions. Clinical correlation is recommended. Electronically Signed   By: Anner Crete M.D.   On: 10/13/2017 23:29   Ct Angio Chest Pe W Or Wo Contrast  Result Date: 10/14/2017 CLINICAL DATA:  67 year old male with positive D-dimer. Concern for pulmonary embolism. EXAM: CT ANGIOGRAPHY CHEST WITH CONTRAST TECHNIQUE: Multidetector CT imaging of the chest was performed using the standard protocol during bolus administration of intravenous contrast. Multiplanar CT image reconstructions and MIPs were obtained to evaluate the vascular anatomy. CONTRAST:  111mL ISOVUE-370 IOPAMIDOL (ISOVUE-370) INJECTION 76% COMPARISON:  Chest radiograph dated 10/13/2017 FINDINGS: Cardiovascular: There is mild cardiomegaly. No pericardial effusion. The thoracic aorta is unremarkable. There is no CT evidence of pulmonary embolism. Mediastinum/Nodes: No hilar or mediastinal adenopathy. Esophagus and the thyroid gland are grossly unremarkable. No mediastinal fluid collection. Lungs/Pleura: Bilateral small to moderate pleural effusions with associated partial compressive atelectasis of the lower lobes. Scattered nodular and hazy airspace densities in the left lower lobe may represent alveolar edema or pneumonia. Clinical correlation is recommended. There is diffuse interlobular septal prominence and edema. There is no pneumothorax. The central airways are patent. Upper Abdomen: Irregular hepatic contour with morphologic changes of cirrhosis. Multiple hypodense hepatic lesions are not characterized on  this CT. The largest lesion in the right lobe of the liver measures 8 cm. These lesions demonstrate fluid attenuation, possibly cysts. However, there is apparent central enhancement or calcification of lesion in the right lobe of the liver (series 7, image 329). Further characterization of the liver lesions with MRI is recommended. Musculoskeletal: Mild degenerative changes of the spine. No acute osseous pathology. Review of the MIP images confirms the above findings. IMPRESSION: 1. No CT evidence of pulmonary embolism. 2. Mild cardiomegaly with findings of CHF cyst and small to moderate bilateral pleural effusions. 3. Nodular and hazy airspace densities in the left lower lobe may represent alveolar edema or pneumonia. Clinical correlation is recommended. 4. Cirrhosis. Multiple hepatic hypodense lesions are not well characterized. Further evaluation with MRI recommended. Electronically Signed   By: Anner Crete M.D.   On: 10/14/2017 04:58    Assessment and Plan:   1. Chest pressure and sob - his troponin is slightly elevated. He will continue IV heparin. He feels better with IV lasix. He will undergo left and right heart cath on Monday. 2. Obesity - he is overweight and will need to work on weight loss as an outpatient. 3. Sob - better. His heart cath will help [place] understand the etiology better.   For questions or updates, please contact [place] Please consult [email or web address] for contact info under Cardiology/STEMI.   Signed, Cristopher Peru, MD  10/14/2017 10:36 AM

## 2017-10-14 NOTE — Progress Notes (Signed)
Per cardiology not patient may have a cath on Monday, paged MD for diet order.

## 2017-10-15 ENCOUNTER — Observation Stay (HOSPITAL_BASED_OUTPATIENT_CLINIC_OR_DEPARTMENT_OTHER): Payer: Medicare Other

## 2017-10-15 DIAGNOSIS — I110 Hypertensive heart disease with heart failure: Secondary | ICD-10-CM | POA: Diagnosis not present

## 2017-10-15 DIAGNOSIS — K7460 Unspecified cirrhosis of liver: Secondary | ICD-10-CM | POA: Diagnosis not present

## 2017-10-15 DIAGNOSIS — I428 Other cardiomyopathies: Secondary | ICD-10-CM | POA: Diagnosis not present

## 2017-10-15 DIAGNOSIS — I5021 Acute systolic (congestive) heart failure: Secondary | ICD-10-CM

## 2017-10-15 DIAGNOSIS — Z87891 Personal history of nicotine dependence: Secondary | ICD-10-CM | POA: Diagnosis not present

## 2017-10-15 DIAGNOSIS — I259 Chronic ischemic heart disease, unspecified: Secondary | ICD-10-CM

## 2017-10-15 DIAGNOSIS — E669 Obesity, unspecified: Secondary | ICD-10-CM | POA: Diagnosis not present

## 2017-10-15 DIAGNOSIS — K8020 Calculus of gallbladder without cholecystitis without obstruction: Secondary | ICD-10-CM | POA: Diagnosis not present

## 2017-10-15 DIAGNOSIS — Z6841 Body Mass Index (BMI) 40.0 and over, adult: Secondary | ICD-10-CM | POA: Diagnosis not present

## 2017-10-15 DIAGNOSIS — I959 Hypotension, unspecified: Secondary | ICD-10-CM | POA: Diagnosis not present

## 2017-10-15 DIAGNOSIS — R0602 Shortness of breath: Secondary | ICD-10-CM | POA: Diagnosis not present

## 2017-10-15 DIAGNOSIS — R079 Chest pain, unspecified: Secondary | ICD-10-CM

## 2017-10-15 DIAGNOSIS — K7689 Other specified diseases of liver: Secondary | ICD-10-CM | POA: Diagnosis not present

## 2017-10-15 DIAGNOSIS — R0683 Snoring: Secondary | ICD-10-CM | POA: Diagnosis not present

## 2017-10-15 DIAGNOSIS — I429 Cardiomyopathy, unspecified: Secondary | ICD-10-CM | POA: Diagnosis not present

## 2017-10-15 DIAGNOSIS — I272 Pulmonary hypertension, unspecified: Secondary | ICD-10-CM | POA: Diagnosis not present

## 2017-10-15 DIAGNOSIS — I5043 Acute on chronic combined systolic (congestive) and diastolic (congestive) heart failure: Secondary | ICD-10-CM | POA: Diagnosis not present

## 2017-10-15 DIAGNOSIS — Z7982 Long term (current) use of aspirin: Secondary | ICD-10-CM | POA: Diagnosis not present

## 2017-10-15 LAB — BASIC METABOLIC PANEL
Anion gap: 10 (ref 5–15)
BUN: 14 mg/dL (ref 8–23)
CALCIUM: 8.9 mg/dL (ref 8.9–10.3)
CHLORIDE: 102 mmol/L (ref 98–111)
CO2: 25 mmol/L (ref 22–32)
CREATININE: 0.83 mg/dL (ref 0.61–1.24)
GFR calc non Af Amer: >60 mL/min (ref >60)
Glucose, Bld: 98 mg/dL (ref 70–99)
Potassium: 4.1 mmol/L (ref 3.5–5.1)
SODIUM: 137 mmol/L (ref 135–145)

## 2017-10-15 LAB — ECHOCARDIOGRAM COMPLETE
HEIGHTINCHES: 68 in
WEIGHTICAEL: 4372.8 [oz_av]

## 2017-10-15 LAB — CBC
HCT: 41.4 % (ref 39.0–52.0)
HEMOGLOBIN: 13.4 g/dL (ref 13.0–17.0)
MCH: 30.7 pg (ref 26.0–34.0)
MCHC: 32.4 g/dL (ref 30.0–36.0)
MCV: 94.7 fL (ref 78.0–100.0)
Platelets: 228 10*3/uL (ref 150–400)
RBC: 4.37 MIL/uL (ref 4.22–5.81)
RDW: 13 % (ref 11.5–15.5)
WBC: 8.3 10*3/uL (ref 4.0–10.5)

## 2017-10-15 LAB — HEPARIN LEVEL (UNFRACTIONATED): HEPARIN UNFRACTIONATED: 0.46 [IU]/mL (ref 0.30–0.70)

## 2017-10-15 LAB — MAGNESIUM: MAGNESIUM: 1.8 mg/dL (ref 1.7–2.4)

## 2017-10-15 MED ORDER — SODIUM CHLORIDE 0.9 % IV SOLN
INTRAVENOUS | Status: DC
  Administered 2017-10-16: 06:00:00 via INTRAVENOUS

## 2017-10-15 MED ORDER — SODIUM CHLORIDE 0.9% FLUSH
3.0000 mL | Freq: Two times a day (BID) | INTRAVENOUS | Status: DC
  Administered 2017-10-15: 3 mL via INTRAVENOUS

## 2017-10-15 MED ORDER — SODIUM CHLORIDE 0.9 % IV SOLN: 250.0000 mL | INTRAVENOUS | Status: DC | PRN

## 2017-10-15 MED ORDER — ORAL CARE MOUTH RINSE
15.0000 mL | Freq: Two times a day (BID) | OROMUCOSAL | Status: DC
  Administered 2017-10-15 – 2017-10-21 (×6): 15 mL via OROMUCOSAL

## 2017-10-15 MED ORDER — ASPIRIN 81 MG PO CHEW
81.0000 mg | CHEWABLE_TABLET | ORAL | Status: AC
  Administered 2017-10-16: 81 mg via ORAL
  Filled 2017-10-15: qty 1

## 2017-10-15 MED ORDER — PERFLUTREN LIPID MICROSPHERE
1.0000 mL | INTRAVENOUS | Status: AC | PRN
  Administered 2017-10-15: 2 mL via INTRAVENOUS
  Filled 2017-10-15: qty 10

## 2017-10-15 MED ORDER — SODIUM CHLORIDE 0.9% FLUSH: 3.0000 mL | INTRAVENOUS | Status: DC | PRN

## 2017-10-15 NOTE — H&P (View-Only) (Signed)
Progress Note  Patient Name: David Castaneda Date of Encounter: 10/15/2017  Primary Cardiologist: No primary care provider on file.   Subjective   Dyspnea and chest pressure resolved.   Inpatient Medications    Scheduled Meds: . aspirin  324 mg Oral Daily  . atorvastatin  80 mg Oral q1800  . famotidine  20 mg Oral QHS  . furosemide  40 mg Intravenous Daily  . mouth rinse  15 mL Mouth Rinse BID   Continuous Infusions: . heparin 1,600 Units/hr (10/14/17 1853)   PRN Meds: acetaminophen, calcium carbonate, dextromethorphan-guaiFENesin, hydrALAZINE, iopamidol, ipratropium, levalbuterol, morphine injection, nitroGLYCERIN, ondansetron (ZOFRAN) IV, perflutren lipid microspheres (DEFINITY) IV suspension, zolpidem   Vital Signs    Vitals:   10/14/17 2347 10/15/17 0422 10/15/17 0423 10/15/17 0500  BP: (!) 113/51 116/81    Pulse: 97 (!) 50 98   Resp: 18 18    Temp: 98 F (36.7 C) 98.5 F (36.9 C)    TempSrc:  Oral    SpO2: 97% 96%    Weight:    273 lb 4.8 oz (124 kg)  Height:        Intake/Output Summary (Last 24 hours) at 10/15/2017 1027 Last data filed at 10/15/2017 0900 Gross per 24 hour  Intake 1237.49 ml  Output 1475 ml  Net -237.51 ml   Filed Weights   10/13/17 2253 10/14/17 0200 10/15/17 0500  Weight: 260 lb (117.9 kg) 281 lb 3.2 oz (127.6 kg) 273 lb 4.8 oz (124 kg)    Telemetry    NSR with PVC's - Personally Reviewed  ECG    none - Personally Reviewed  Physical Exam   GEN: obese, no acute distress.   Neck: 6 cm JVD Cardiac: RRR, no murmurs, rubs, or gallops.  Respiratory: Clear to auscultation bilaterally. GI: Soft, obese, nontender, non-distended  MS: No edema; No deformity. Neuro:  Nonfocal  Psych: Normal affect   Labs    Chemistry Recent Labs  Lab 10/13/17 2300 10/14/17 1130 10/15/17 0629  NA 139 139 137  K 4.6 3.7 4.1  CL 107 101 102  CO2 24 25 25   GLUCOSE 137* 107* 98  BUN 9 12 14   CREATININE 0.87 0.98 0.83  CALCIUM 8.8* 9.2 8.9    GFRNONAA >60 >60 >60  GFRAA >60 >60 >60  ANIONGAP 8 13 10      Hematology Recent Labs  Lab 10/13/17 2300 10/15/17 0629  WBC 6.7 8.3  RBC 4.66 4.37  HGB 14.2 13.4  HCT 44.7 41.4  MCV 95.9 94.7  MCH 30.5 30.7  MCHC 31.8 32.4  RDW 12.9 13.0  PLT 211 228    Cardiac Enzymes Recent Labs  Lab 10/14/17 0112 10/14/17 0637 10/14/17 1130  TROPONINI 0.08* 0.11* 0.12*    Recent Labs  Lab 10/13/17 2305  TROPIPOC 0.05     BNP Recent Labs  Lab 10/14/17 0113  BNP 455.7*     DDimer  Recent Labs  Lab 10/14/17 0112  DDIMER 1.09*     Radiology    Dg Chest 2 View  Result Date: 10/13/2017 CLINICAL DATA:  67 year old male with chest pain. EXAM: CHEST - 2 VIEW COMPARISON:  None FINDINGS: There is mild cardiomegaly with mild vascular congestion and small bilateral pleural effusions. No focal consolidation, or pneumothorax. No acute osseous pathology. IMPRESSION: Cardiomegaly with mild CHF and small bilateral pleural effusions. Clinical correlation is recommended. Electronically Signed   By: Anner Crete M.D.   On: 10/13/2017 23:29   Ct Angio Chest Pe  W Or Wo Contrast  Result Date: 10/14/2017 CLINICAL DATA:  67 year old male with positive D-dimer. Concern for pulmonary embolism. EXAM: CT ANGIOGRAPHY CHEST WITH CONTRAST TECHNIQUE: Multidetector CT imaging of the chest was performed using the standard protocol during bolus administration of intravenous contrast. Multiplanar CT image reconstructions and MIPs were obtained to evaluate the vascular anatomy. CONTRAST:  134mL ISOVUE-370 IOPAMIDOL (ISOVUE-370) INJECTION 76% COMPARISON:  Chest radiograph dated 10/13/2017 FINDINGS: Cardiovascular: There is mild cardiomegaly. No pericardial effusion. The thoracic aorta is unremarkable. There is no CT evidence of pulmonary embolism. Mediastinum/Nodes: No hilar or mediastinal adenopathy. Esophagus and the thyroid gland are grossly unremarkable. No mediastinal fluid collection. Lungs/Pleura:  Bilateral small to moderate pleural effusions with associated partial compressive atelectasis of the lower lobes. Scattered nodular and hazy airspace densities in the left lower lobe may represent alveolar edema or pneumonia. Clinical correlation is recommended. There is diffuse interlobular septal prominence and edema. There is no pneumothorax. The central airways are patent. Upper Abdomen: Irregular hepatic contour with morphologic changes of cirrhosis. Multiple hypodense hepatic lesions are not characterized on this CT. The largest lesion in the right lobe of the liver measures 8 cm. These lesions demonstrate fluid attenuation, possibly cysts. However, there is apparent central enhancement or calcification of lesion in the right lobe of the liver (series 7, image 329). Further characterization of the liver lesions with MRI is recommended. Musculoskeletal: Mild degenerative changes of the spine. No acute osseous pathology. Review of the MIP images confirms the above findings. IMPRESSION: 1. No CT evidence of pulmonary embolism. 2. Mild cardiomegaly with findings of CHF cyst and small to moderate bilateral pleural effusions. 3. Nodular and hazy airspace densities in the left lower lobe may represent alveolar edema or pneumonia. Clinical correlation is recommended. 4. Cirrhosis. Multiple hepatic hypodense lesions are not well characterized. Further evaluation with MRI recommended. Electronically Signed   By: Anner Crete M.D.   On: 10/14/2017 04:58    Cardiac Studies   2D echo pending  Patient Profile     67 y.o. male admitted with chest pressure and sob.  Assessment & Plan    1. Chest pressure/sob - he is pain free. He will undergo left and right heart cath. Additional rec's to follow. 2. Obesity - he will need to lose weight as an outpatient.      For questions or updates, please contact [place] Please consult [email or web address] for contact info under Cardiology/STEMI.       Signed, Cristopher Peru, MD  10/15/2017, 10:27 AM  Patient ID: Jennings Books, male   DOB: April 19, 1950, 67 y.o.   MRN: [id number]

## 2017-10-15 NOTE — Progress Notes (Signed)
When nurse went to have pt sign consent and noted the iv pump was off, upon pump review the volume was not in the intake since 1144, called pharmacy and spoke to [facility] and since cath in am, will restart heparin at 16u and will redraw labs in am, safety lock on iv pump as pt is not sure what happened

## 2017-10-15 NOTE — Progress Notes (Signed)
ANTICOAGULATION CONSULT NOTE - Follow Up Consult  Pharmacy Consult for Heparin Indication: chest pain/ACS  No Known Allergies  Patient Measurements: Height: 5\' 8"  (172.7 cm) Weight: 273 lb 4.8 oz (124 kg) IBW/kg (Calculated) : 68.4 Heparin Dosing Weight: 100 kg  Vital Signs: Temp: 98.5 F (36.9 C) (07/14 0422) Temp Source: Oral (07/14 0422) BP: 116/81 (07/14 0422) Pulse Rate: 98 (07/14 0423)  Labs: Recent Labs    10/13/17 2300 10/14/17 0112 10/14/17 0637 10/14/17 1130 10/14/17 1818 10/15/17 0629  HGB 14.2  --   --   --   --  13.4  HCT 44.7  --   --   --   --  41.4  PLT 211  --   --   --   --  228  HEPARINUNFRC  --   --   --  0.23* 0.44 0.46  CREATININE 0.87  --   --  0.98  --  0.83  TROPONINI  --  0.08* 0.11* 0.12*  --   --     Estimated Creatinine Clearance: 107.6 mL/min (by C-G formula based on SCr of 0.83 mg/dL).  Assessment:  67 yr old male on IV heparin for chest pain/ACS.  Cath planned for 7/15.   Heparin level remains therapeutic (0.46) on 1600 units/hr.    Goal of Therapy:  Heparin level 0.3-0.7 units/ml Monitor platelets by anticoagulation protocol: Yes   Plan:   Continue heparin drip at 1600 units/hr.  Daily heparin level and CBC while on heparin.  Arty Baumgartner, [place] Pager: [phone] or phone: [phone] 10/15/2017,10:45 AM

## 2017-10-15 NOTE — Progress Notes (Signed)
Progress Note  Patient Name: David Castaneda Date of Encounter: 10/15/2017  Primary Cardiologist: No primary care provider on file.   Subjective   Dyspnea and chest pressure resolved.   Inpatient Medications    Scheduled Meds: . aspirin  324 mg Oral Daily  . atorvastatin  80 mg Oral q1800  . famotidine  20 mg Oral QHS  . furosemide  40 mg Intravenous Daily  . mouth rinse  15 mL Mouth Rinse BID   Continuous Infusions: . heparin 1,600 Units/hr (10/14/17 1853)   PRN Meds: acetaminophen, calcium carbonate, dextromethorphan-guaiFENesin, hydrALAZINE, iopamidol, ipratropium, levalbuterol, morphine injection, nitroGLYCERIN, ondansetron (ZOFRAN) IV, perflutren lipid microspheres (DEFINITY) IV suspension, zolpidem   Vital Signs    Vitals:   10/14/17 2347 10/15/17 0422 10/15/17 0423 10/15/17 0500  BP: (!) 113/51 116/81    Pulse: 97 (!) 50 98   Resp: 18 18    Temp: 98 F (36.7 C) 98.5 F (36.9 C)    TempSrc:  Oral    SpO2: 97% 96%    Weight:    273 lb 4.8 oz (124 kg)  Height:        Intake/Output Summary (Last 24 hours) at 10/15/2017 1027 Last data filed at 10/15/2017 0900 Gross per 24 hour  Intake 1237.49 ml  Output 1475 ml  Net -237.51 ml   Filed Weights   10/13/17 2253 10/14/17 0200 10/15/17 0500  Weight: 260 lb (117.9 kg) 281 lb 3.2 oz (127.6 kg) 273 lb 4.8 oz (124 kg)    Telemetry    NSR with PVC's - Personally Reviewed  ECG    none - Personally Reviewed  Physical Exam   GEN: obese, no acute distress.   Neck: 6 cm JVD Cardiac: RRR, no murmurs, rubs, or gallops.  Respiratory: Clear to auscultation bilaterally. GI: Soft, obese, nontender, non-distended  MS: No edema; No deformity. Neuro:  Nonfocal  Psych: Normal affect   Labs    Chemistry Recent Labs  Lab 10/13/17 2300 10/14/17 1130 10/15/17 0629  NA 139 139 137  K 4.6 3.7 4.1  CL 107 101 102  CO2 24 25 25   GLUCOSE 137* 107* 98  BUN 9 12 14   CREATININE 0.87 0.98 0.83  CALCIUM 8.8* 9.2 8.9    GFRNONAA >60 >60 >60  GFRAA >60 >60 >60  ANIONGAP 8 13 10      Hematology Recent Labs  Lab 10/13/17 2300 10/15/17 0629  WBC 6.7 8.3  RBC 4.66 4.37  HGB 14.2 13.4  HCT 44.7 41.4  MCV 95.9 94.7  MCH 30.5 30.7  MCHC 31.8 32.4  RDW 12.9 13.0  PLT 211 228    Cardiac Enzymes Recent Labs  Lab 10/14/17 0112 10/14/17 0637 10/14/17 1130  TROPONINI 0.08* 0.11* 0.12*    Recent Labs  Lab 10/13/17 2305  TROPIPOC 0.05     BNP Recent Labs  Lab 10/14/17 0113  BNP 455.7*     DDimer  Recent Labs  Lab 10/14/17 0112  DDIMER 1.09*     Radiology    Dg Chest 2 View  Result Date: 10/13/2017 CLINICAL DATA:  67 year old male with chest pain. EXAM: CHEST - 2 VIEW COMPARISON:  None FINDINGS: There is mild cardiomegaly with mild vascular congestion and small bilateral pleural effusions. No focal consolidation, or pneumothorax. No acute osseous pathology. IMPRESSION: Cardiomegaly with mild CHF and small bilateral pleural effusions. Clinical correlation is recommended. Electronically Signed   By: Anner Crete M.D.   On: 10/13/2017 23:29   Ct Angio Chest Pe  W Or Wo Contrast  Result Date: 10/14/2017 CLINICAL DATA:  67 year old male with positive D-dimer. Concern for pulmonary embolism. EXAM: CT ANGIOGRAPHY CHEST WITH CONTRAST TECHNIQUE: Multidetector CT imaging of the chest was performed using the standard protocol during bolus administration of intravenous contrast. Multiplanar CT image reconstructions and MIPs were obtained to evaluate the vascular anatomy. CONTRAST:  175mL ISOVUE-370 IOPAMIDOL (ISOVUE-370) INJECTION 76% COMPARISON:  Chest radiograph dated 10/13/2017 FINDINGS: Cardiovascular: There is mild cardiomegaly. No pericardial effusion. The thoracic aorta is unremarkable. There is no CT evidence of pulmonary embolism. Mediastinum/Nodes: No hilar or mediastinal adenopathy. Esophagus and the thyroid gland are grossly unremarkable. No mediastinal fluid collection. Lungs/Pleura:  Bilateral small to moderate pleural effusions with associated partial compressive atelectasis of the lower lobes. Scattered nodular and hazy airspace densities in the left lower lobe may represent alveolar edema or pneumonia. Clinical correlation is recommended. There is diffuse interlobular septal prominence and edema. There is no pneumothorax. The central airways are patent. Upper Abdomen: Irregular hepatic contour with morphologic changes of cirrhosis. Multiple hypodense hepatic lesions are not characterized on this CT. The largest lesion in the right lobe of the liver measures 8 cm. These lesions demonstrate fluid attenuation, possibly cysts. However, there is apparent central enhancement or calcification of lesion in the right lobe of the liver (series 7, image 329). Further characterization of the liver lesions with MRI is recommended. Musculoskeletal: Mild degenerative changes of the spine. No acute osseous pathology. Review of the MIP images confirms the above findings. IMPRESSION: 1. No CT evidence of pulmonary embolism. 2. Mild cardiomegaly with findings of CHF cyst and small to moderate bilateral pleural effusions. 3. Nodular and hazy airspace densities in the left lower lobe may represent alveolar edema or pneumonia. Clinical correlation is recommended. 4. Cirrhosis. Multiple hepatic hypodense lesions are not well characterized. Further evaluation with MRI recommended. Electronically Signed   By: Anner Crete M.D.   On: 10/14/2017 04:58    Cardiac Studies   2D echo pending  Patient Profile     68 y.o. male admitted with chest pressure and sob.  Assessment & Plan    1. Chest pressure/sob - he is pain free. He will undergo left and right heart cath. Additional rec's to follow. 2. Obesity - he will need to lose weight as an outpatient.      For questions or updates, please contact [place] Please consult [email or web address] for contact info under Cardiology/STEMI.       Signed, Cristopher Peru, MD  10/15/2017, 10:27 AM  Patient ID: Jennings Books, male   DOB: 1950/07/29, 67 y.o.   MRN: [id number]

## 2017-10-15 NOTE — Progress Notes (Signed)
  Echocardiogram 2D Echocardiogram has been performed.  Bobbye Charleston 10/15/2017, 9:40 AM

## 2017-10-15 NOTE — Progress Notes (Signed)
[facility]  PROGRESS NOTE  Jaquari Reckner GBT:[id number] DOB: 1950-09-17 DOA: 10/13/2017 PCP: Patient, No Pcp Per   Brief HPI:   67 year old male with history of kidney stone, obesity, former smoker came to the hospital with chest pressure.  Patient has not seen a physician past 30 years.  Has been having intermittent chest pain for about a week.  Also found to have elevated troponin 0.08, 0.11, 0.12, chest x-ray showed mild pulmonary edema and cardiomegaly.  D-dimer was elevated to 1.09, CTA chest obtained which was negative for PE.  BNP was elevated to 455.7    Subjective   Patient seen and examined, denies chest pain or shortness of breath.   Assessment/Plan:     1. Chest pain-troponin elevated to 0.12, started on IV heparin.  Cardiology is following.  Plan for left and right heart cath in a.m.  Continue aspirin, Lipitor 2. Dyspnea-second to systolic CHF,  echo done today shows EF 15 to 20%.  Continue Lasix 40 mg IV daily.     DVT prophylaxis: Heparin  Code Status: Full code  Family Communication: No family at bedside  Disposition Plan: To be decided   Consultants:  Cardiology  Procedures:  None   Antibiotics:   Anti-infectives (From admission, onward)   None       Objective   Vitals:   10/15/17 0422 10/15/17 0423 10/15/17 0500 10/15/17 1234  BP: 116/81   108/78  Pulse: (!) 50 98  91  Resp: 18   18  Temp: 98.5 F (36.9 C)   97.9 F (36.6 C)  TempSrc: Oral   Oral  SpO2: 96%   94%  Weight:   124 kg (273 lb 4.8 oz)   Height:        Intake/Output Summary (Last 24 hours) at 10/15/2017 1242 Last data filed at 10/15/2017 0900 Gross per 24 hour  Intake 1134.25 ml  Output 1250 ml  Net -115.75 ml   Filed Weights   10/13/17 2253 10/14/17 0200 10/15/17 0500  Weight: 117.9 kg (260 lb) 127.6 kg (281 lb 3.2 oz) 124 kg (273 lb 4.8 oz)     Physical Examination:    General: Appears in no acute distress  Cardiovascular: S1-S2,  regular  Respiratory: Clear to auscultation bilaterally   Abdomen: Soft, nontender, no organomegaly  Extremities: Trace edema of the lower extremities  Neurologic: Alert, oriented x3     Data Reviewed: I have personally reviewed following labs and imaging studies  CBG: No results for input(s): GLUCAP in the last 168 hours.  CBC: Recent Labs  Lab 10/13/17 2300 10/15/17 0629  WBC 6.7 8.3  HGB 14.2 13.4  HCT 44.7 41.4  MCV 95.9 94.7  PLT 211 710    Basic Metabolic Panel: Recent Labs  Lab 10/13/17 2300 10/14/17 1130 10/15/17 0629  NA 139 139 137  K 4.6 3.7 4.1  CL 107 101 102  CO2 24 25 25   GLUCOSE 137* 107* 98  BUN 9 12 14   CREATININE 0.87 0.98 0.83  CALCIUM 8.8* 9.2 8.9  MG  --  1.7 1.8    No results found for this or any previous visit (from the past 240 hour(s)).   Liver Function Tests: No results for input(s): AST, ALT, ALKPHOS, BILITOT, PROT, ALBUMIN in the last 168 hours. No results for input(s): LIPASE, AMYLASE in the last 168 hours. No results for input(s): AMMONIA in the last 168 hours.  Cardiac Enzymes: Recent Labs  Lab 10/14/17 0112 10/14/17 6269 10/14/17 1130  TROPONINI 0.08* 0.11* 0.12*   BNP (last 3 results) Recent Labs    10/14/17 0113  BNP 455.7*    ProBNP (last 3 results) No results for input(s): PROBNP in the last 8760 hours.    Studies: Dg Chest 2 View  Result Date: 10/13/2017 CLINICAL DATA:  67 year old male with chest pain. EXAM: CHEST - 2 VIEW COMPARISON:  None FINDINGS: There is mild cardiomegaly with mild vascular congestion and small bilateral pleural effusions. No focal consolidation, or pneumothorax. No acute osseous pathology. IMPRESSION: Cardiomegaly with mild CHF and small bilateral pleural effusions. Clinical correlation is recommended. Electronically Signed   By: Anner Crete M.D.   On: 10/13/2017 23:29   Ct Angio Chest Pe W Or Wo Contrast  Result Date: 10/14/2017 CLINICAL DATA:  67 year old male with  positive D-dimer. Concern for pulmonary embolism. EXAM: CT ANGIOGRAPHY CHEST WITH CONTRAST TECHNIQUE: Multidetector CT imaging of the chest was performed using the standard protocol during bolus administration of intravenous contrast. Multiplanar CT image reconstructions and MIPs were obtained to evaluate the vascular anatomy. CONTRAST:  158mL ISOVUE-370 IOPAMIDOL (ISOVUE-370) INJECTION 76% COMPARISON:  Chest radiograph dated 10/13/2017 FINDINGS: Cardiovascular: There is mild cardiomegaly. No pericardial effusion. The thoracic aorta is unremarkable. There is no CT evidence of pulmonary embolism. Mediastinum/Nodes: No hilar or mediastinal adenopathy. Esophagus and the thyroid gland are grossly unremarkable. No mediastinal fluid collection. Lungs/Pleura: Bilateral small to moderate pleural effusions with associated partial compressive atelectasis of the lower lobes. Scattered nodular and hazy airspace densities in the left lower lobe may represent alveolar edema or pneumonia. Clinical correlation is recommended. There is diffuse interlobular septal prominence and edema. There is no pneumothorax. The central airways are patent. Upper Abdomen: Irregular hepatic contour with morphologic changes of cirrhosis. Multiple hypodense hepatic lesions are not characterized on this CT. The largest lesion in the right lobe of the liver measures 8 cm. These lesions demonstrate fluid attenuation, possibly cysts. However, there is apparent central enhancement or calcification of lesion in the right lobe of the liver (series 7, image 329). Further characterization of the liver lesions with MRI is recommended. Musculoskeletal: Mild degenerative changes of the spine. No acute osseous pathology. Review of the MIP images confirms the above findings. IMPRESSION: 1. No CT evidence of pulmonary embolism. 2. Mild cardiomegaly with findings of CHF cyst and small to moderate bilateral pleural effusions. 3. Nodular and hazy airspace densities in  the left lower lobe may represent alveolar edema or pneumonia. Clinical correlation is recommended. 4. Cirrhosis. Multiple hepatic hypodense lesions are not well characterized. Further evaluation with MRI recommended. Electronically Signed   By: Anner Crete M.D.   On: 10/14/2017 04:58    Scheduled Meds: . aspirin  324 mg Oral Daily  . [START ON 10/16/2017] aspirin  81 mg Oral Pre-Cath  . atorvastatin  80 mg Oral q1800  . famotidine  20 mg Oral QHS  . furosemide  40 mg Intravenous Daily  . mouth rinse  15 mL Mouth Rinse BID  . sodium chloride flush  3 mL Intravenous Q12H      Time spent: 25 min  [name]   [facility] Pager [phone]. If 7PM-7AM, please contact night-coverage at [email or web address], Office  [phone]  password [email or web address]  10/15/2017, 12:42 PM  LOS: 0 days

## 2017-10-16 ENCOUNTER — Encounter (HOSPITAL_COMMUNITY)
Admission: EM | Disposition: A | Discharge status: Home / Self Care | Payer: Self-pay | Source: Home / Self Care | Attending: Family Medicine

## 2017-10-16 DIAGNOSIS — I5043 Acute on chronic combined systolic (congestive) and diastolic (congestive) heart failure: Secondary | ICD-10-CM

## 2017-10-16 DIAGNOSIS — I429 Cardiomyopathy, unspecified: Secondary | ICD-10-CM

## 2017-10-16 LAB — BASIC METABOLIC PANEL
ANION GAP: 11 (ref 5–15)
BUN: 11 mg/dL (ref 8–23)
CHLORIDE: 103 mmol/L (ref 98–111)
CO2: 27 mmol/L (ref 22–32)
Calcium: 9.2 mg/dL (ref 8.9–10.3)
Creatinine, Ser: 0.84 mg/dL (ref 0.61–1.24)
GFR calc Af Amer: >60 mL/min (ref >60)
GLUCOSE: 77 mg/dL (ref 70–99)
POTASSIUM: 3.5 mmol/L (ref 3.5–5.1)
Sodium: 141 mmol/L (ref 135–145)

## 2017-10-16 LAB — PROTIME-INR
INR: 1.26
Prothrombin Time: 15.7 seconds — ABNORMAL HIGH (ref 11.4–15.2)

## 2017-10-16 LAB — CBC
HEMATOCRIT: 40.6 % (ref 39.0–52.0)
HEMOGLOBIN: 13.4 g/dL (ref 13.0–17.0)
MCH: 30.4 pg (ref 26.0–34.0)
MCHC: 33 g/dL (ref 30.0–36.0)
MCV: 92.1 fL (ref 78.0–100.0)
Platelets: 220 10*3/uL (ref 150–400)
RBC: 4.41 MIL/uL (ref 4.22–5.81)
RDW: 12.7 % (ref 11.5–15.5)
WBC: 7.8 10*3/uL (ref 4.0–10.5)

## 2017-10-16 LAB — TSH: TSH: 0.75 u[IU]/mL (ref 0.350–4.500)

## 2017-10-16 LAB — POCT I-STAT 3, VENOUS BLOOD GAS (G3P V)
Bicarbonate: 26.1 mmol/L (ref 20.0–28.0)
O2 SAT: 76 %
PH VEN: 7.35 (ref 7.250–7.430)
TCO2: 28 mmol/L (ref 22–32)
pCO2, Ven: 47.3 mmHg (ref 44.0–60.0)
pO2, Ven: 43 mmHg (ref 32.0–45.0)

## 2017-10-16 LAB — HEPARIN LEVEL (UNFRACTIONATED): Heparin Unfractionated: 0.33 IU/mL (ref 0.30–0.70)

## 2017-10-16 LAB — ALT: ALT: 30 U/L (ref 0–44)

## 2017-10-16 LAB — AST: AST: 81 U/L — ABNORMAL HIGH (ref 15–41)

## 2017-10-16 SURGERY — RIGHT HEART CATH AND CORONARY ANGIOGRAPHY
Anesthesia: LOCAL

## 2017-10-16 MED ORDER — HEPARIN SODIUM (PORCINE) 1000 UNIT/ML IJ SOLN
INTRAMUSCULAR | Status: AC
  Filled 2017-10-16: qty 1

## 2017-10-16 MED ORDER — POTASSIUM CHLORIDE CRYS ER 20 MEQ PO TBCR
40.0000 meq | EXTENDED_RELEASE_TABLET | Freq: Once | ORAL | Status: AC
Start: 2017-10-16 — End: 2017-10-16
  Administered 2017-10-16: 40 meq via ORAL
  Filled 2017-10-16: qty 2

## 2017-10-16 MED ORDER — SACUBITRIL-VALSARTAN 24-26 MG PO TABS
1.0000 | ORAL_TABLET | Freq: Two times a day (BID) | ORAL | Status: DC
  Administered 2017-10-16 – 2017-10-20 (×6): 1 via ORAL
  Filled 2017-10-16 (×8): qty 1

## 2017-10-16 MED ORDER — SODIUM CHLORIDE 0.9% FLUSH: 3.0000 mL | INTRAVENOUS | Status: DC | PRN

## 2017-10-16 MED ORDER — FENTANYL CITRATE (PF) 100 MCG/2ML IJ SOLN
INTRAMUSCULAR | Status: AC
  Filled 2017-10-16: qty 2

## 2017-10-16 MED ORDER — SODIUM CHLORIDE 0.9 % IV SOLN: 250.0000 mL | INTRAVENOUS | Status: DC | PRN

## 2017-10-16 MED ORDER — ACETAMINOPHEN 325 MG PO TABS
650.0000 mg | ORAL_TABLET | ORAL | Status: DC | PRN
  Administered 2017-10-16: 650 mg via ORAL
  Filled 2017-10-16: qty 2

## 2017-10-16 MED ORDER — FENTANYL CITRATE (PF) 100 MCG/2ML IJ SOLN
INTRAMUSCULAR | Status: DC | PRN
  Administered 2017-10-16: 25 ug via INTRAVENOUS

## 2017-10-16 MED ORDER — HEPARIN SODIUM (PORCINE) 5000 UNIT/ML IJ SOLN
5000.0000 [IU] | Freq: Three times a day (TID) | INTRAMUSCULAR | Status: DC
  Administered 2017-10-16 – 2017-10-21 (×11): 5000 [IU] via SUBCUTANEOUS
  Filled 2017-10-16 (×10): qty 1

## 2017-10-16 MED ORDER — ONDANSETRON HCL 4 MG/2ML IJ SOLN: 4.0000 mg | Freq: Four times a day (QID) | INTRAMUSCULAR | Status: DC | PRN

## 2017-10-16 MED ORDER — HEPARIN (PORCINE) IN NACL 1000-0.9 UT/500ML-% IV SOLN
INTRAVENOUS | Status: DC | PRN
  Administered 2017-10-16: 500 mL

## 2017-10-16 MED ORDER — SPIRONOLACTONE 12.5 MG HALF TABLET
12.5000 mg | ORAL_TABLET | Freq: Every day | ORAL | Status: DC
  Administered 2017-10-16: 12.5 mg via ORAL
  Filled 2017-10-16: qty 1

## 2017-10-16 MED ORDER — MAGNESIUM SULFATE 2 GM/50ML IV SOLN
2.0000 g | Freq: Once | INTRAVENOUS | Status: AC
  Administered 2017-10-16: 2 g via INTRAVENOUS
  Filled 2017-10-16: qty 50

## 2017-10-16 MED ORDER — SODIUM CHLORIDE 0.9% FLUSH
3.0000 mL | Freq: Two times a day (BID) | INTRAVENOUS | Status: DC
  Administered 2017-10-17 – 2017-10-20 (×6): 3 mL via INTRAVENOUS

## 2017-10-16 MED ORDER — IOHEXOL 350 MG/ML SOLN
INTRAVENOUS | Status: DC | PRN
  Administered 2017-10-16: 85 mL via INTRA_ARTERIAL

## 2017-10-16 MED ORDER — VERAPAMIL HCL 2.5 MG/ML IV SOLN
INTRAVENOUS | Status: AC
  Filled 2017-10-16: qty 2

## 2017-10-16 MED ORDER — MIDAZOLAM HCL 2 MG/2ML IJ SOLN
INTRAMUSCULAR | Status: AC
  Filled 2017-10-16: qty 2

## 2017-10-16 MED ORDER — ASPIRIN EC 81 MG PO TBEC: 81.0000 mg | DELAYED_RELEASE_TABLET | Freq: Every day | ORAL | Status: DC

## 2017-10-16 MED ORDER — POTASSIUM CHLORIDE CRYS ER 20 MEQ PO TBCR
40.0000 meq | EXTENDED_RELEASE_TABLET | Freq: Once | ORAL | Status: AC
  Administered 2017-10-16: 40 meq via ORAL
  Filled 2017-10-16: qty 2

## 2017-10-16 MED ORDER — FUROSEMIDE 10 MG/ML IJ SOLN
80.0000 mg | Freq: Two times a day (BID) | INTRAMUSCULAR | Status: AC
  Administered 2017-10-17 – 2017-10-18 (×4): 80 mg via INTRAVENOUS
  Filled 2017-10-16 (×4): qty 8

## 2017-10-16 MED ORDER — MIDAZOLAM HCL 2 MG/2ML IJ SOLN
INTRAMUSCULAR | Status: DC | PRN
  Administered 2017-10-16: 1 mg via INTRAVENOUS

## 2017-10-16 MED ORDER — LIDOCAINE HCL (PF) 1 % IJ SOLN
INTRAMUSCULAR | Status: DC | PRN
  Administered 2017-10-16: 2 mL

## 2017-10-16 MED ORDER — LIDOCAINE HCL (PF) 1 % IJ SOLN
INTRAMUSCULAR | Status: AC
  Filled 2017-10-16: qty 30

## 2017-10-16 MED ORDER — HEPARIN (PORCINE) IN NACL 1000-0.9 UT/500ML-% IV SOLN
INTRAVENOUS | Status: AC
  Filled 2017-10-16: qty 1000

## 2017-10-16 MED ORDER — VERAPAMIL HCL 2.5 MG/ML IV SOLN
INTRAVENOUS | Status: DC | PRN
  Administered 2017-10-16: 16:00:00 via INTRA_ARTERIAL

## 2017-10-16 MED ORDER — HEPARIN SODIUM (PORCINE) 1000 UNIT/ML IJ SOLN
INTRAMUSCULAR | Status: DC | PRN
  Administered 2017-10-16: 5500 [IU] via INTRAVENOUS

## 2017-10-16 SURGICAL SUPPLY — 13 items
CATH 5FR JL3.5 JR4 ANG PIG MP (CATHETERS) ×2 IMPLANT
CATH BALLN WEDGE 5F 110CM (CATHETERS) ×2 IMPLANT
DEVICE RAD COMP TR BAND LRG (VASCULAR PRODUCTS) ×2 IMPLANT
GLIDESHEATH SLEND SS 6F .021 (SHEATH) ×2 IMPLANT
GUIDEWIRE .025 260CM (WIRE) ×2 IMPLANT
GUIDEWIRE INQWIRE 1.5J.035X260 (WIRE) ×1 IMPLANT
INQWIRE 1.5J .035X260CM (WIRE) ×2
KIT HEART LEFT (KITS) ×2 IMPLANT
PACK CARDIAC CATHETERIZATION (CUSTOM PROCEDURE TRAY) ×2 IMPLANT
SHEATH GLIDE SLENDER 4/5FR (SHEATH) ×2 IMPLANT
TRANSDUCER W/STOPCOCK (MISCELLANEOUS) ×2 IMPLANT
TUBING CIL FLEX 10 FLL-RA (TUBING) ×2 IMPLANT
WIRE HI TORQ VERSACORE-J 145CM (WIRE) ×2 IMPLANT

## 2017-10-16 NOTE — Progress Notes (Signed)
[facility]  PROGRESS NOTE  David Castaneda OYD:[id number] DOB: 1950-10-28 DOA: 10/13/2017 PCP: Patient, No Pcp Per   Brief HPI:   67 year old male with history of kidney stone, obesity, former smoker came to the hospital with chest pressure.  Patient has not seen a physician past 30 years.  Has been having intermittent chest pain for about a week.  Also found to have elevated troponin 0.08, 0.11, 0.12, chest x-ray showed mild pulmonary edema and cardiomegaly.  D-dimer was elevated to 1.09, CTA chest obtained which was negative for PE.  BNP was elevated to 455.7    Subjective   Patient seen and examined, denies chest pain or shortness of breath.   Assessment/Plan:     1. Chest pain-troponin elevated to 0.12, started on IV heparin.  Cardiology is following.  Plan for left and right heart cath today.  Continue aspirin, Lipitor. 2. Dyspnea-second to systolic CHF,  echo done today shows EF 15 to 20%.  Continue Lasix 40 mg IV daily.     DVT prophylaxis: Heparin  Code Status: Full code  Family Communication: No family at bedside  Disposition Plan: To be decided   Consultants:  Cardiology  Procedures:  None   Antibiotics:   Anti-infectives (From admission, onward)   None       Objective   Vitals:   10/15/17 2347 10/16/17 0430 10/16/17 0440 10/16/17 1216  BP: 126/73  131/88 131/90  Pulse: 81  88 93  Resp: 18  20 18   Temp: 98 F (36.7 C)  98.4 F (36.9 C) 98.6 F (37 C)  TempSrc: Oral  Oral Oral  SpO2: 96%  96% 97%  Weight:  122 kg (268 lb 14.4 oz)    Height:        Intake/Output Summary (Last 24 hours) at 10/16/2017 1335 Last data filed at 10/16/2017 1100 Gross per 24 hour  Intake 939.31 ml  Output 1880 ml  Net -940.69 ml   Filed Weights   10/14/17 0200 10/15/17 0500 10/16/17 0430  Weight: 127.6 kg (281 lb 3.2 oz) 124 kg (273 lb 4.8 oz) 122 kg (268 lb 14.4 oz)     Physical Examination:     Lungs: Normal respiratory effort, decreased breath  sounds bilaterally Heart: Regular rate and rhythm, S1 and S2 normal, no murmurs, rubs auscultated Abdomen: BS normoactive,soft,nondistended,non-tender to palpation,no organomegaly Extremities: No pretibial edema, no erythema, no cyanosis, no clubbing Neuro : Alert and oriented to time, place and person, No focal deficits      Data Reviewed: I have personally reviewed following labs and imaging studies  CBG: No results for input(s): GLUCAP in the last 168 hours.  CBC: Recent Labs  Lab 10/13/17 2300 10/15/17 0629 10/16/17 0603  WBC 6.7 8.3 7.8  HGB 14.2 13.4 13.4  HCT 44.7 41.4 40.6  MCV 95.9 94.7 92.1  PLT 211 228 672    Basic Metabolic Panel: Recent Labs  Lab 10/13/17 2300 10/14/17 1130 10/15/17 0629 10/16/17 0603  NA 139 139 137 141  K 4.6 3.7 4.1 3.5  CL 107 101 102 103  CO2 24 25 25 27   GLUCOSE 137* 107* 98 77  BUN 9 12 14 11   CREATININE 0.87 0.98 0.83 0.84  CALCIUM 8.8* 9.2 8.9 9.2  MG  --  1.7 1.8  --     No results found for this or any previous visit (from the past 240 hour(s)).   Liver Function Tests: Recent Labs  Lab 10/16/17 1000  AST 81*  ALT  30   No results for input(s): LIPASE, AMYLASE in the last 168 hours. No results for input(s): AMMONIA in the last 168 hours.  Cardiac Enzymes: Recent Labs  Lab 10/14/17 0112 10/14/17 0637 10/14/17 1130  TROPONINI 0.08* 0.11* 0.12*   BNP (last 3 results) Recent Labs    10/14/17 0113  BNP 455.7*    ProBNP (last 3 results) No results for input(s): PROBNP in the last 8760 hours.    Studies: No results found.  Scheduled Meds: . [START ON 10/17/2017] aspirin EC  81 mg Oral Daily  . atorvastatin  80 mg Oral q1800  . famotidine  20 mg Oral QHS  . furosemide  40 mg Intravenous Daily  . mouth rinse  15 mL Mouth Rinse BID  . sodium chloride flush  3 mL Intravenous Q12H  . spironolactone  12.5 mg Oral QHS      Time spent: 25 min  [name]   [facility] Pager [phone].  If 7PM-7AM, please contact night-coverage at [email or web address], Office  [phone]  password [email or web address]  10/16/2017, 1:35 PM  LOS: 1 day

## 2017-10-16 NOTE — Progress Notes (Signed)
Heart Failure Navigator Consult Note  Presentation: Per David Mountain NP: [name] is a 67 y.o. male with a history of obesity and tobacco use. He last saw a doctor 30 years ago. He does not take any medications at home.   He has been struggling with SOB and fatigue over the last 3-4 years. In the last 2 weeks, he started having intermittent midsternal CP with associated SOB. He had no other associated symptoms. He also has BLE edema, weight gain, fatigue, and orthopnea. No cough, fever, or chills. No recent virus. He snores and had a sleep study >30 years ago and does not wear CPAP. He eats mainly frozen and canned foods.   Presented to [facility] on 10/13/17 with worsening CP. He was started on IV heparin for mild troponin increase. CTA obtained with positive d-dimer, which showed no PE, but did show CHF, cirrhosis, and LLL edema vs pneumonia. Echo obtained, which showed EF 15-20% with grade 2 DD (see below). He is diuresing well with IV lasix. Creatinine has been stable. Going for [facility] today.   Pertinent admission labs include: troponin 0.05 > 0.11> 0.12, d-dimer 1.09, BNP 456, K 4.6, creatinine 0.87 CXR: mild CHF, cardiomegaly, small bilateral effusions CTA: negative for PE, shows mild CHF and cardiomegaly, cirrhosis, nodular and hazy airspace densities in LLL (edema vs PNA)  Today he is feeling much better. Denies orthopnea or CP. Mild SOB with walking to bathroom.   He has diuresed 13 lbs so far on 40 mg IV lasix BID. Creatinine 0.84, K 3.5  Past Medical History:  Diagnosis Date  . Former smoker   . Kidney stone   . Sucking chest wound     Social History   Socioeconomic History  . Marital status: Married    Spouse name: Not on file  . Number of children: Not on file  . Years of education: Not on file  . Highest education level: Not on file  Occupational History  . Not on file  Social Needs  . Financial resource strain: Not on file  . Food insecurity:    Worry: Not on file     Inability: Not on file  . Transportation needs:    Medical: Not on file    Non-medical: Not on file  Tobacco Use  . Smoking status: Former Research scientist (life sciences)  . Smokeless tobacco: Never Used  Substance and Sexual Activity  . Alcohol use: Not Currently  . Drug use: Never  . Sexual activity: Not on file  Lifestyle  . Physical activity:    Days per week: Not on file    Minutes per session: Not on file  . Stress: Not on file  Relationships  . Social connections:    Talks on phone: Not on file    Gets together: Not on file    Attends religious service: Not on file    Active member of club or organization: Not on file    Attends meetings of clubs or organizations: Not on file    Relationship status: Not on file  Other Topics Concern  . Not on file  Social History Narrative  . Not on file    ECHO:10/15/17 Study Conclusions  - Left ventricle: The cavity size was mildly dilated. Wall   thickness was increased in a pattern of mild LVH. Systolic   function was severely reduced. The estimated ejection fraction   was in the range of 15% to 20%. Diffuse hypokinesis. Definity   contrast shows slow flow and swirling  at the LV apex without   formed mural thrombus. Indeterminate diastolic function -   possible grade 2 dysfunction. - Aorta: Aortic root dimension: 47 mm (ED). - Aortic root: The aortic root was moderately dilated. - Mitral valve: There was trivial regurgitation. - Left atrium: The atrium was mildly dilated. - Right atrium: Central venous pressure (est): 3 mm Hg. - Atrial septum: No defect or patent foramen ovale was identified. - Tricuspid valve: There was trivial regurgitation. - Pulmonary arteries: Systolic pressure could not be accurately   estimated. - Pericardium, extracardiac: There was no pericardial effusion.   Possible hepatic cyst visualized.   BNP    Component Value Date/Time   BNP 455.7 (H) 10/14/2017 0113    ProBNP No results found for:  PROBNP   Education Assessment and Provision:  Detailed education and instructions provided on heart failure disease management including the following:  Signs and symptoms of Heart Failure When to call the physician Importance of daily weights Low sodium diet Fluid restriction Medication management Anticipated future follow-up appointments  Patient education given on each of the above topics.  Patient acknowledges understanding and acceptance of all instructions.  I spoke with David Castaneda regarding his HF and current hospitalization.  He had many pertinent questions regarding his cardiac catheterization and treatment.  I reviewed the importance of daily weights and when to contact the physician regarding worsening symptoms.  We also reviewed a low sodium diet and high sodium foods to avoid. I will have dietician see him regarding detailed diet education. He has Medicare and is not aware of any issues with getting or taking prescribed medications as most will be new. He will follow in the [facility] after discharge.  Education Materials:  "Living Better With Heart Failure" Booklet, Daily Weight Tracker Tool  High Risk Criteria for Readmission and/or Poor Patient Outcomes:  (Recommend Follow-up with [facility] Clinic)--yes   EF <30%- new 15-20%  2 or more admissions in 6 months- No New HF  Difficult social situation- denies  Demonstrates medication noncompliance- denies  Barriers of Care:  Knowledge and compliance  Discharge Planning:   Plans to return to home with "lady friend" to [place].

## 2017-10-16 NOTE — Progress Notes (Signed)
   10/16/17 1100  Clinical Encounter Type  Visited With Patient  Visit Type Initial  Referral From Nurse  Spiritual Encounters  Spiritual Needs Brochure  Stress Factors  Patient Stress Factors Exhausted    Pt was laying on his bed. No family member on-site but pt said his girlfriend was supportive. Pt was very receptive and a[ppreciative about POA paperwork provision which he promised to finish and call the=roug his nurse. Chaplain provided explanation on POA papers and offered compassionate presence.  Cyndel Griffey a Medical sales representative, [employer]

## 2017-10-16 NOTE — Consult Note (Addendum)
Advanced Heart Failure Team Consult Note   Primary Physician: Patient, No Pcp Per PCP-Cardiologist:  No primary care provider on file.  Reason for Consultation: systolic heart failure  HPI:    David Castaneda is seen today for evaluation of new systolic heart failure at the request of Dr Darrick Meigs.   David Castaneda is a 67 y.o. male with a history of obesity and tobacco use. He last saw a doctor 30 years ago. He does not take any medications at home.   He has been struggling with SOB and fatigue over the last 3-4 years. In the last 2 weeks, he started having intermittent midsternal CP with associated SOB. He had no other associated symptoms. He also has BLE edema, weight gain, fatigue, and orthopnea. No cough, fever, or chills. No recent virus. He snores and had a sleep study >30 years ago and does not wear CPAP. He eats mainly frozen and canned foods.   Presented to [facility] on 10/13/17 with worsening CP. He was started on IV heparin for mild troponin increase. CTA obtained with positive d-dimer, which showed no PE, but did show CHF, cirrhosis, and LLL edema vs pneumonia. Echo obtained, which showed EF 15-20% with grade 2 DD (see below). He is diuresing well with IV lasix. Creatinine has been stable. Going for [facility] today.   Pertinent admission labs include: troponin 0.05 > 0.11> 0.12, d-dimer 1.09, BNP 456, K 4.6, creatinine 0.87 CXR: mild CHF, cardiomegaly, small bilateral effusions CTA: negative for PE, shows mild CHF and cardiomegaly, cirrhosis, nodular and hazy airspace densities in LLL (edema vs PNA)  Today he is feeling much better. Denies orthopnea or CP. Mild SOB with walking to bathroom.   He has diuresed 13 lbs so far on 40 mg IV lasix BID. Creatinine 0.84, K 3.5  Echo 10/15/17: - Left ventricle: The cavity size was mildly dilated. Wall   thickness was increased in a pattern of mild LVH. Systolic   function was severely reduced. The estimated ejection fraction   was in the range of 15%  to 20%. Diffuse hypokinesis. Definity   contrast shows slow flow and swirling at the LV apex without   formed mural thrombus. Indeterminate diastolic function -   possible grade 2 dysfunction. - Aorta: Aortic root dimension: 47 mm (ED). - Aortic root: The aortic root was moderately dilated. - Mitral valve: There was trivial regurgitation. - Left atrium: The atrium was mildly dilated. - Right atrium: Central venous pressure (est): 3 mm Hg. - Atrial septum: No defect or patent foramen ovale was identified. - Tricuspid valve: There was trivial regurgitation. - Pulmonary arteries: Systolic pressure could not be accurately   estimated. - Pericardium, extracardiac: There was no pericardial effusion.   Possible hepatic cyst visualized.  FH: father had heart failure, died at 59  SH: former cigarette smoker up to 2 ppd for 35 years. Quit 4 years ago, but now vapes, Drinks 2-3 margaritas/week. No drug use. Lives alone, able to drive, and has insurance. He is not sure if he has prescription coverage.    Review of Systems: [y] = yes, [ ]  = no   General: Weight gain Blue.Reese ]; Weight loss [ ] ; Anorexia [ ] ; Fatigue [ y]; Fever [ ] ; Chills [ ] ; Weakness [ ]   Cardiac: Chest pain/pressure Blue.Reese ]; Resting SOB [ ] ; Exertional SOB Blue.Reese ]; Orthopnea Blue.Reese ]; Pedal Edema Blue.Reese ]; Palpitations [ ] ; Syncope [ ] ; Presyncope [ ] ; Paroxysmal nocturnal dyspnea[ ]   Pulmonary: Cough [ ] ;  Wheezing[ ] ; Hemoptysis[ ] ; Sputum [ ] ; Snoring [ ]   GI: Vomiting[ ] ; Dysphagia[ ] ; Melena[ ] ; Hematochezia [ ] ; Heartburn[ ] ; Abdominal pain [ ] ; Constipation [ ] ; Diarrhea [ ] ; BRBPR [ ]   GU: Hematuria[ ] ; Dysuria [ ] ; Nocturia[ ]   Vascular: Pain in legs with walking [ ] ; Pain in feet with lying flat [ ] ; Non-healing sores [ ] ; Stroke [ ] ; TIA [ ] ; Slurred speech [ ] ;  Neuro: Headaches[ ] ; Vertigo[ ] ; Seizures[ ] ; Paresthesias[ ] ;Blurred vision [ ] ; Diplopia [ ] ; Vision changes [ ]   Ortho/Skin: Arthritis [ y]; Joint pain [ y]; Muscle pain [ ] ;  Joint swelling [ ] ; Back Pain [ ] ; Rash [ ]   Psych: Depression[ ] ; Anxiety[ ]   Heme: Bleeding problems [ ] ; Clotting disorders [ ] ; Anemia [ ]   Endocrine: Diabetes [ ] ; Thyroid dysfunction[ ]   Home Medications Prior to Admission medications   Medication Sig Start Date End Date Taking? Authorizing Provider  acetaminophen (TYLENOL) 500 MG tablet Take 500 mg by mouth every 6 (six) hours as needed for mild pain or fever.   Yes [provider]  aspirin 81 MG chewable tablet Chew 81 mg by mouth as needed for mild pain.   Yes [provider]  ePHEDrine-guaiFENesin (BRONKAID) 25-400 MG TABS Take 1 tablet by mouth 4 (four) times daily.   Yes [provider]    Past Medical History: Past Medical History:  Diagnosis Date  . Former smoker   . Kidney stone   . Sucking chest wound     Past Surgical History: Past Surgical History:  Procedure Laterality Date  . wound     wound in back in war    Family History: Family History  Problem Relation Age of Onset  . Dementia Mother   . Heart disease Father   . Lung cancer Brother        SCLC    Social History: Social History   Socioeconomic History  . Marital status: Married    Spouse name: Not on file  . Number of children: Not on file  . Years of education: Not on file  . Highest education level: Not on file  Occupational History  . Not on file  Social Needs  . Financial resource strain: Not on file  . Food insecurity:    Worry: Not on file    Inability: Not on file  . Transportation needs:    Medical: Not on file    Non-medical: Not on file  Tobacco Use  . Smoking status: Former Research scientist (life sciences)  . Smokeless tobacco: Never Used  Substance and Sexual Activity  . Alcohol use: Not Currently  . Drug use: Never  . Sexual activity: Not on file  Lifestyle  . Physical activity:    Days per week: Not on file    Minutes per session: Not on file  . Stress: Not on file  Relationships  . Social connections:     Talks on phone: Not on file    Gets together: Not on file    Attends religious service: Not on file    Active member of club or organization: Not on file    Attends meetings of clubs or organizations: Not on file    Relationship status: Not on file  Other Topics Concern  . Not on file  Social History Narrative  . Not on file    Allergies:  No Known Allergies  Objective:    Vital Signs:   Temp:  [  97.7 F (36.5 C)-98.4 F (36.9 C)] 98.4 F (36.9 C) (07/15 0440) Pulse Rate:  [81-91] 88 (07/15 0440) Resp:  [18-20] 20 (07/15 0440) BP: (108-131)/(50-88) 131/88 (07/15 0440) SpO2:  [94 %-97 %] 96 % (07/15 0440) Weight:  [268 lb 14.4 oz (122 kg)] 268 lb 14.4 oz (122 kg) (07/15 0430) Last BM Date: 10/14/17  Weight change: Filed Weights   10/14/17 0200 10/15/17 0500 10/16/17 0430  Weight: 281 lb 3.2 oz (127.6 kg) 273 lb 4.8 oz (124 kg) 268 lb 14.4 oz (122 kg)    Intake/Output:   Intake/Output Summary (Last 24 hours) at 10/16/2017 0848 Last data filed at 10/16/2017 0837 Gross per 24 hour  Intake 1092.26 ml  Output 3205 ml  Net -2112.74 ml      Physical Exam    General:  Obese. No resp difficulty HEENT: normal Neck: supple. JVP 10-12. Carotids 2+ bilat; no bruits. No lymphadenopathy or thyromegaly appreciated. Cor: PMI nondisplaced. Regular rate & rhythm. No rubs, gallops or murmurs. Lungs: clear Abdomen: obese, soft, nontender, +distended. No hepatosplenomegaly. No bruits or masses. Good bowel sounds. Extremities: no cyanosis, clubbing, rash, 1+ BLE edema, warm Neuro: alert & orientedx3, cranial nerves grossly intact. moves all 4 extremities w/o difficulty. Affect pleasant   Telemetry   NSR 80s. Personally reviewed.   EKG    Sinus tach, 111 bpm, mild ST elevations V1-V3 with incomplete LBBB (qrs 100 ms). Personally reviewed.   Labs   Basic Metabolic Panel: Recent Labs  Lab 10/13/17 2300 10/14/17 1130 10/15/17 0629 10/16/17 0603  NA 139 139 137 141  K 4.6  3.7 4.1 3.5  CL 107 101 102 103  CO2 24 25 25 27   GLUCOSE 137* 107* 98 77  BUN 9 12 14 11   CREATININE 0.87 0.98 0.83 0.84  CALCIUM 8.8* 9.2 8.9 9.2  MG  --  1.7 1.8  --     Liver Function Tests: No results for input(s): AST, ALT, ALKPHOS, BILITOT, PROT, ALBUMIN in the last 168 hours. No results for input(s): LIPASE, AMYLASE in the last 168 hours. No results for input(s): AMMONIA in the last 168 hours.  CBC: Recent Labs  Lab 10/13/17 2300 10/15/17 0629 10/16/17 0603  WBC 6.7 8.3 7.8  HGB 14.2 13.4 13.4  HCT 44.7 41.4 40.6  MCV 95.9 94.7 92.1  PLT 211 228 220    Cardiac Enzymes: Recent Labs  Lab 10/14/17 0112 10/14/17 0637 10/14/17 1130  TROPONINI 0.08* 0.11* 0.12*    BNP: BNP (last 3 results) Recent Labs    10/14/17 0113  BNP 455.7*    ProBNP (last 3 results) No results for input(s): PROBNP in the last 8760 hours.   CBG: No results for input(s): GLUCAP in the last 168 hours.  Coagulation Studies: Recent Labs    10/16/17 0603  LABPROT 15.7*  INR 1.26     Imaging    No results found.   Medications:     Current Medications: . aspirin  324 mg Oral Daily  . atorvastatin  80 mg Oral q1800  . famotidine  20 mg Oral QHS  . furosemide  40 mg Intravenous Daily  . mouth rinse  15 mL Mouth Rinse BID  . sodium chloride flush  3 mL Intravenous Q12H     Infusions: . sodium chloride    . sodium chloride 10 mL/hr at 10/16/17 0602  . heparin 1,600 Units/hr (10/15/17 1616)       Patient Profile   Terreon Ekholm is a 67 y.o.  male with a history of obesity and tobacco use. He last saw a doctor 30 years ago. He does not take any medications at home.  Admitted with CP and SOB and found to have EF 15-20%.    Assessment/Plan   1. Acute systolic heart failure. No recent virus. A1C 5.3. Add on TSH. He does snore and will need sleep study outpatient. Several risk factors for CAD. LHC today.  - Echo 10/15/17: EF 15-20%, grade 2 DD - Volume status  elevated, but diuresing well. Weight is down 13 lbs from admission. - Continue 40 mg IV lasix BID.  - Start spiro 12.5 mg daily.  - Consider ARB vs Entresto tomorrow.  - Consider digoxin - Hold off on BB until after cath. He does not seem to be low output.  - Add TED hose - Consult cardiac rehab - Consult RD - R/LHC today.   2. Chest pain - LHC today to evaluate coronaries - Continue ASA, statin - Troponin trend flat. EKG with mild ST elevations V1-V3. No comparison available.  - Remains on heparin drip  3. Tobacco use - Former cigarette smoker with 70 pack years. Now vaping. Encouraged cessation.   4. HTN - Manage with HF medications. SBP 110-150s.  5. Cirrhosis  - Noted on CTA. Add on AST/ALT.  - Consider liver US  6. Obesity - Body mass index is 40.89 kg/m.   7. Snoring - Will need sleep study outpatient.   Medication concerns reviewed with patient and pharmacy team. Barriers identified: Needs a lot of education. Not sure if he has prescription coverage.   Length of Stay: [place], NP  10/16/2017, 8:48 AM  Advanced Heart Failure Team Pager [phone] (M-F; 7a - 4p)  Please contact [facility] for night-coverage after hours (4p -7a ) and weekends on [email or web address]  Patient seen with NP, agree with the above note.  He was admitted with presumed acute systolic CHF.  LHC/RHC was done today, results as below:   Left Main  No coronary disease noted.  Left Anterior Descending  No coronary disease noted.  Ramus Intermedius  Moderate ramus. No coronary disease noted.  Left Circumflex  No coronary disease noted.  Right Coronary Artery  No coronary disease noted.  Intervention   No interventions have been documented.  Right Heart   Right Heart Pressures RHC Procedural Findings: Hemodynamics (mmHg) RA mean 13 RV 48/18 PA 44/19, mean 31 PCWP mean 31  Oxygen saturations: PA 76% AO 97%  Cardiac Output (Fick) 8  Cardiac Index (Fick) 3.48   On exam,  JVP elevated 14+ cm, 1+ ankle edema. Regular S1S2.   1. Acute systolic CHF: Echo with EF 15-20%.  Nonischemic cardiomyopathy, no significant CAD on coronary angiography today.  No recent viral URI symptoms.  TSH normal.  ETOH cardiomyopathy is a concern given evidence for cirrhosis on CT, will need to discuss ETOH intake with him.  RHC with preserved cardiac output and elevated filling pressures.  Volume overloaded on exam.  - Will need cardiac MRI to look for infiltrative disease.  - Starting spironolactone 12.5 mg daily and Entresto 24/26 bid.  - Eventual addition of Coreg.  - Increase Lasix to 80 mg IV bid, follow I/Os closely.  2. Cirrhosis: Noted on CTA.  Recommend liver MRI to followup cystic structures.  ?Cause, ?heavy ETOH.  Will need to discuss with patient.  3. Snoring: Will need outpatient sleep study.   Loralie Champagne 10/16/2017 4:59 PM

## 2017-10-16 NOTE — Progress Notes (Signed)
Pt has order for ted hose, measured with size xl recommended per chart,requested unit secretary to order pair of ted hose per order

## 2017-10-16 NOTE — Progress Notes (Signed)
Pt waiting in cath holding for availability of receiving RN . Rt radial TR band site and RT AC site level zero. Pain free. Awake, alert and oriented.

## 2017-10-16 NOTE — Progress Notes (Signed)
Pt had 12 beat run of vtach. Pt was asleep. VS stable. MD notified.

## 2017-10-16 NOTE — Progress Notes (Signed)
Received consult ? Prescription drug plan CM talked to patient, he has Medicare and states that he has a plan with [employer] as a retiree but does not recall the name. Pharmacy of choice is [facility] in [place] [phone]); CM talked to the pharmacist, he has not used their pharmacy since 10/27/2016 and at that time he had a Part D plan for prescriptions. Patient does not have his insurance card with him, it is at home but he believes that he has prescription drug coverage. Mindi Slicker [facility] [phone]

## 2017-10-16 NOTE — Progress Notes (Signed)
ANTICOAGULATION CONSULT NOTE - Follow Up Consult  Pharmacy Consult for Heparin Indication: chest pain/ACS  No Known Allergies  Patient Measurements: Height: 5\' 8"  (172.7 cm) Weight: 268 lb 14.4 oz (122 kg)(Scale B) IBW/kg (Calculated) : 68.4 Heparin Dosing Weight: 100 kg  Vital Signs: Temp: 98.4 F (36.9 C) (07/15 0440) Temp Source: Oral (07/15 0440) BP: 131/88 (07/15 0440) Pulse Rate: 88 (07/15 0440)  Labs: Recent Labs    10/13/17 2300 10/14/17 0112 10/14/17 4008  10/14/17 1130 10/14/17 1818 10/15/17 0629 10/16/17 0603  HGB 14.2  --   --   --   --   --  13.4 13.4  HCT 44.7  --   --   --   --   --  41.4 40.6  PLT 211  --   --   --   --   --  228 220  LABPROT  --   --   --   --   --   --   --  15.7*  INR  --   --   --   --   --   --   --  1.26  HEPARINUNFRC  --   --   --    < > 0.23* 0.44 0.46 0.33  CREATININE 0.87  --   --   --  0.98  --  0.83 0.84  TROPONINI  --  0.08* 0.11*  --  0.12*  --   --   --    < > = values in this interval not displayed.    Estimated Creatinine Clearance: 105.4 mL/min (by C-G formula based on SCr of 0.84 mg/dL).  Assessment:  67 yr old male on IV heparin for chest pain/ACS.  Cath planned for later today to assess coronaries. Heparin turned off yesterday afternoon, but now therapeutic at 0.33 after resuming.   Goal of Therapy:  Heparin level 0.3-0.7 units/ml Monitor platelets by anticoagulation protocol: Yes   Plan:  -Continue heparin drip at 1600 units/hr. -Daily heparin level and CBC while on heparin. -F/U plans post/cath  Arrie Senate, PharmD, BCPS Clinical Pharmacist [phone] Please check AMION for all [facility] numbers 10/16/2017

## 2017-10-16 NOTE — Interval H&P Note (Signed)
History and Physical Interval Note:  10/16/2017 3:26 PM  David Castaneda  has presented today for surgery, with the diagnosis of NSTEMI  The various methods of treatment have been discussed with the patient and family. After consideration of risks, benefits and other options for treatment, the patient has consented to  Procedure(s): RIGHT/LEFT HEART CATH AND CORONARY ANGIOGRAPHY (N/A) as a surgical intervention .  The patient's history has been reviewed, patient examined, no change in status, stable for surgery.  I have reviewed the patient's chart and labs.  Questions were answered to the patient's satisfaction.     Statia Burdick [employer]

## 2017-10-17 LAB — BASIC METABOLIC PANEL
Anion gap: 10 (ref 5–15)
BUN: 9 mg/dL (ref 8–23)
CALCIUM: 9.2 mg/dL (ref 8.9–10.3)
CO2: 25 mmol/L (ref 22–32)
CREATININE: 0.85 mg/dL (ref 0.61–1.24)
Chloride: 105 mmol/L (ref 98–111)
GFR calc non Af Amer: >60 mL/min (ref >60)
Glucose, Bld: 83 mg/dL (ref 70–99)
Potassium: 4.3 mmol/L (ref 3.5–5.1)
SODIUM: 140 mmol/L (ref 135–145)

## 2017-10-17 LAB — MAGNESIUM: MAGNESIUM: 2.1 mg/dL (ref 1.7–2.4)

## 2017-10-17 MED ORDER — SPIRONOLACTONE 25 MG PO TABS
25.0000 mg | ORAL_TABLET | Freq: Every day | ORAL | Status: DC
  Administered 2017-10-17 – 2017-10-18 (×2): 25 mg via ORAL
  Filled 2017-10-17 (×3): qty 1

## 2017-10-17 NOTE — Progress Notes (Addendum)
[facility]  PROGRESS NOTE  Josyah Achor XHB:[id number] DOB: [birth year] DOA: 10/13/2017 PCP: Patient, No Pcp Per   Brief HPI:   67 year old male with history of kidney stone, obesity, former smoker came to the hospital with chest pressure.  Patient has not seen a physician past 30 years.  Has been having intermittent chest pain for about a week.  Also found to have elevated troponin 0.08, 0.11, 0.12, chest x-ray showed mild pulmonary edema and cardiomegaly.  D-dimer was elevated to 1.09, CTA chest obtained which was negative for PE.  BNP was elevated to 455.7    Subjective   Patient seen and examined, denies chest pain.  Positive orthopnea.     Assessment/Plan:     1. Chest pain-resolved, troponin elevated to 0.12, initially started on IV heparin which has been discontinued per cardiology.    Cardiac catheterization revealed no significant CAD. 2. Acute systolic heart failure-echocardiogram shows EF 15 to 20%, started on Lasix 80  mg IV  twice daily.  Cardiology is following.  Continue Entresto, Aldactone. 3. Snoring-patient will need outpatient sleep study 4. ? Liver cirrhosis-seen on CT chest, recommended MRI of the liver.  Is going to get cardiac MRI in a.m to look for infiltrative disease.  If able to tolerate  will consider ordering MRI liver in a.m.     DVT prophylaxis: Heparin  Code Status: Full code  Family Communication: No family at bedside  Disposition Plan: To be decided   Consultants:  Cardiology  Procedures:  None   Antibiotics:   Anti-infectives (From admission, onward)   None       Objective   Vitals:   10/17/17 0411 10/17/17 0900 10/17/17 1132 10/17/17 1137  BP: 110/86 (!) 114/95 (!) 89/67 90/64  Pulse: 82 (!) 102 91   Resp: 18 19 20    Temp: 98.2 F (36.8 C) 98.2 F (36.8 C) 98.5 F (36.9 C)   TempSrc: Oral Oral Oral   SpO2: 93% 94% 94%   Weight: 119.7 kg (263 lb 14.4 oz)     Height:        Intake/Output Summary (Last 24 hours)  at 10/17/2017 1227 Last data filed at 10/17/2017 8101 Gross per 24 hour  Intake 920.46 ml  Output 1840 ml  Net -919.54 ml   Filed Weights   10/15/17 0500 10/16/17 0430 10/17/17 0411  Weight: 124 kg (273 lb 4.8 oz) 122 kg (268 lb 14.4 oz) 119.7 kg (263 lb 14.4 oz)     Physical Examination:     General: Appears in no acute distress  Cardiovascular: S1-S2, regular  Respiratory: Decreased breath sounds bilaterally  Abdomen: Soft, nontender, no organomegaly  Musculoskeletal: Bilateral trace edema of the lower extremities      Data Reviewed: I have personally reviewed following labs and imaging studies  CBG: No results for input(s): GLUCAP in the last 168 hours.  CBC: Recent Labs  Lab 10/13/17 2300 10/15/17 0629 10/16/17 0603  WBC 6.7 8.3 7.8  HGB 14.2 13.4 13.4  HCT 44.7 41.4 40.6  MCV 95.9 94.7 92.1  PLT 211 228 751    Basic Metabolic Panel: Recent Labs  Lab 10/13/17 2300 10/14/17 1130 10/15/17 0629 10/16/17 0603 10/17/17 0245  NA 139 139 137 141 140  K 4.6 3.7 4.1 3.5 4.3  CL 107 101 102 103 105  CO2 24 25 25 27 25   GLUCOSE 137* 107* 98 77 83  BUN 9 12 14 11 9   CREATININE 0.87 0.98 0.83 0.84 0.85  CALCIUM  8.8* 9.2 8.9 9.2 9.2  MG  --  1.7 1.8  --  2.1    No results found for this or any previous visit (from the past 240 hour(s)).   Liver Function Tests: Recent Labs  Lab 10/16/17 1000  AST 81*  ALT 30   No results for input(s): LIPASE, AMYLASE in the last 168 hours. No results for input(s): AMMONIA in the last 168 hours.  Cardiac Enzymes: Recent Labs  Lab 10/14/17 0112 10/14/17 0637 10/14/17 1130  TROPONINI 0.08* 0.11* 0.12*   BNP (last 3 results) Recent Labs    10/14/17 0113  BNP 455.7*    ProBNP (last 3 results) No results for input(s): PROBNP in the last 8760 hours.    Studies: No results found.  Scheduled Meds: . famotidine  20 mg Oral QHS  . furosemide  80 mg Intravenous BID  . heparin  5,000 Units Subcutaneous  Q8H  . mouth rinse  15 mL Mouth Rinse BID  . sacubitril-valsartan  1 tablet Oral BID  . sodium chloride flush  3 mL Intravenous Q12H  . spironolactone  25 mg Oral QHS      Time spent: 25 min  [facility] Pager [phone]. If 7PM-7AM, please contact night-coverage at [email or web address], Office  [phone]  password [email or web address]  10/17/2017, 12:27 PM  LOS: 2 days

## 2017-10-17 NOTE — Progress Notes (Signed)
[id number] Came to see pt to walk. He was snoring loudly and then he would stop for a few seconds. ? Sleep apnea.  Pt awoke with a startle. He stated he has walked twice today and tired afterwards but tolerated ok. Waiting for family to visit. Reviewed daily weights and when to call MD from zones. He received dietary ed earlier today. Pt stated he had not seen MD in a long time but everyone here has treated him well. Family in to see pt. Graylon Good RN BSN 10/17/2017 3:05 PM

## 2017-10-17 NOTE — Progress Notes (Addendum)
Advanced Heart Failure Rounding Note  PCP-Cardiologist: No primary care provider on file.   Subjective:    Sluggish UOP yesterday, but did not receive any lasix. Weight down 5 lbs. Starting 80 mg IV lasix BID today. Started on Entresto and spiro yesterday.   Creatinine stable 0.85, K 4.3  Denies CP or dizziness. SOB is improving. Still orthopneic. Discussed cath results.   R/LHC 10/16/17: Left Main  No coronary disease noted.  Left Anterior Descending  No coronary disease noted.  Ramus Intermedius  Moderate ramus. No coronary disease noted.  Left Circumflex  No coronary disease noted.  Right Coronary Artery  No coronary disease noted.   Right Heart Pressures RHC Procedural Findings: Hemodynamics (mmHg) RA mean 13 RV 48/18 PA 44/19, mean 31 PCWP mean 31  Oxygen saturations: PA 76% AO 97%  Cardiac Output (Fick) 8  Cardiac Index (Fick) 3.48   Echo 10/15/17: mild LVH, EF 15-20%, diffuse HK, ?grade 2 DD, aortic root moderately dilated, trivial MR, LA mildly dilated,trivial TR, no pericardial effusion  Objective:   Weight Range: 263 lb 14.4 oz (119.7 kg) Body mass index is 40.13 kg/m.   Vital Signs:   Temp:  [97.9 F (36.6 C)-99.8 F (37.7 C)] 98.2 F (36.8 C) (07/16 0411) Pulse Rate:  [82-97] 82 (07/16 0411) Resp:  [10-20] 18 (07/16 0411) BP: (110-153)/(75-113) 110/86 (07/16 0411) SpO2:  [93 %-99 %] 93 % (07/16 0411) Weight:  [263 lb 14.4 oz (119.7 kg)] 263 lb 14.4 oz (119.7 kg) (07/16 0411) Last BM Date: 10/15/17  Weight change: Filed Weights   10/15/17 0500 10/16/17 0430 10/17/17 0411  Weight: 273 lb 4.8 oz (124 kg) 268 lb 14.4 oz (122 kg) 263 lb 14.4 oz (119.7 kg)    Intake/Output:   Intake/Output Summary (Last 24 hours) at 10/17/2017 0814 Last data filed at 10/17/2017 0413 Gross per 24 hour  Intake 694.51 ml  Output 1475 ml  Net -780.49 ml      Physical Exam    General:  Obese. No resp difficulty HEENT: Normal Neck: Supple. JVP  difficult, but appears 10-12. Carotids 2+ bilat; no bruits. No lymphadenopathy or thyromegaly appreciated. Cor: PMI nondisplaced. Regular rate & rhythm. No rubs, gallops or murmurs. Lungs: Clear Abdomen: Soft, nontender, nondistended. No hepatosplenomegaly. No bruits or masses. Good bowel sounds. Extremities: No cyanosis, clubbing, rash, 1+ ankle edema. BLE TED hose on.  Neuro: Alert & orientedx3, cranial nerves grossly intact. moves all 4 extremities w/o difficulty. Affect pleasant   Telemetry   NSR 80-90s. 12 beats NSVT.  Personally reviewed.   EKG    No new tracings.   Labs    CBC Recent Labs    10/15/17 0629 10/16/17 0603  WBC 8.3 7.8  HGB 13.4 13.4  HCT 41.4 40.6  MCV 94.7 92.1  PLT 228 735   Basic Metabolic Panel Recent Labs    10/15/17 0629 10/16/17 0603 10/17/17 0245  NA 137 141 140  K 4.1 3.5 4.3  CL 102 103 105  CO2 25 27 25   GLUCOSE 98 77 83  BUN 14 11 9   CREATININE 0.83 0.84 0.85  CALCIUM 8.9 9.2 9.2  MG 1.8  --  2.1   Liver Function Tests Recent Labs    10/16/17 1000  AST 81*  ALT 30   No results for input(s): LIPASE, AMYLASE in the last 72 hours. Cardiac Enzymes Recent Labs    10/14/17 1130  TROPONINI 0.12*    BNP: BNP (last 3 results) Recent Labs  10/14/17 0113  BNP 455.7*    ProBNP (last 3 results) No results for input(s): PROBNP in the last 8760 hours.   D-Dimer No results for input(s): DDIMER in the last 72 hours. Hemoglobin A1C No results for input(s): HGBA1C in the last 72 hours. Fasting Lipid Panel No results for input(s): CHOL, HDL, LDLCALC, TRIG, CHOLHDL, LDLDIRECT in the last 72 hours. Thyroid Function Tests Recent Labs    10/16/17 1000  TSH 0.750    Other results:   Imaging     No results found.   Medications:     Scheduled Medications: . aspirin EC  81 mg Oral Daily  . atorvastatin  80 mg Oral q1800  . famotidine  20 mg Oral QHS  . furosemide  80 mg Intravenous BID  . heparin  5,000  Units Subcutaneous Q8H  . mouth rinse  15 mL Mouth Rinse BID  . sacubitril-valsartan  1 tablet Oral BID  . sodium chloride flush  3 mL Intravenous Q12H  . spironolactone  12.5 mg Oral QHS     Infusions: . sodium chloride       PRN Medications:  sodium chloride, acetaminophen, calcium carbonate, dextromethorphan-guaiFENesin, hydrALAZINE, iopamidol, ipratropium, levalbuterol, morphine injection, nitroGLYCERIN, ondansetron (ZOFRAN) IV, sodium chloride flush, zolpidem    Patient Profile   David Castaneda is a 67 y.o. male with a history of obesity and tobacco use. He last saw a doctor 30 years ago. He does not take any medications at home.  Admitted with CP and SOB and found to have EF 15-20%.    Assessment/Plan   1. Acute systolic heart failure. NICM. No recent virus. A1C 5.3. Add on TSH. He does snore and will need sleep study outpatient. [place] 10/16/17 with no CAD. [place] 10/16/17 with preserved CO and elevated filling pressures. He drinks 2-3 margaritas/week. No hx of heavy ETOH use in past. - Echo 10/15/17: EF 15-20%, grade 2 DD - Volume status elevated - Continue 80 mg IV lasix BID. (1st dose this am) - Increase spiro to 25 mg daily. K 4.3, but received 80 meq supp yesterday.  - Continue Entresto 24/26 mg BID - Will need BB eventually.  - Continue TED hose - Cardiac rehab consulted.  - RD consulted - He will need cardiac MRI to look for infiltrative disease. He cannot lie flat yet.   2. Chest pain - LHC 7/15 with no CAD - DC aspirin with no CAD.  - LDL 85, but AST elevated to 81. Consider stopping statin.   3. Tobacco use - Former cigarette smoker with 70 pack years. Now vaping. Encouraged cessation. No change.   4. HTN - Manage with HF medications. SBP 110-130s  5. Cirrhosis  - Noted on CTA. AST elevated to 81, ALT 30. - Liver MRI recommended. Defer to primary. He only drinks 2-3 drinks/week. No hx of former heavy ETOH use.   6. Obesity - Body mass index is 40.13  kg/m.  7. Snoring - Will need sleep study outpatient. No change.   Medication concerns reviewed with patient and pharmacy team. Barriers identified: Needs a lot of education. Not sure if he has prescription coverage.    Length of Stay: [place], NP  10/17/2017, 8:14 AM  Advanced Heart Failure Team Pager [phone] (M-F; 7a - 4p)  Please contact [facility] for night-coverage after hours (4p -7a ) and weekends on [email or web address]  Patient seen with NP, agree with the above note.  He did not get IV Lasix yesterday,  still orthopneic.    On exam, JVP 12 cm.  Reg S1S2.  Clear lungs.  1+ ankle edema.    1. Acute systolic CHF: Echo with EF 15-20%.  Nonischemic cardiomyopathy, no significant CAD on coronary angiography today.  No recent viral URI symptoms.  TSH normal.  No heavy ETOH history.  Possible viral myocarditis.  RHC with preserved cardiac output and elevated filling pressures.  Volume overloaded on exam still today, unfortunately did not get Lasix yesterday.  - Will need cardiac MRI to look for infiltrative disease => probably tomorrow b/c orthopneic.  - Continue spironolactone 12.5 mg daily and Entresto 24/26 bid, just started yesterday evening so would consider up-titration tomorrow.  - Eventual addition of Coreg when better-diuresed.  - Lasix 80 mg IV bid, follow I/Os closely.  2. Cirrhosis: Noted on CTA.  Recommend liver MRI to followup cystic structures.  ?Cause, denies heavy ETOH.  3. Snoring: Will need outpatient sleep study.   Loralie Champagne 10/17/2017 9:42 AM

## 2017-10-17 NOTE — Plan of Care (Signed)
Nutrition Education Note  RD consulted for nutrition education regarding new onset CHF.  Spoke with pt at bedside. Pt reports that he has a good appetite but that his eating is "not good." Pt elaborated by stating that he is eating foods he knows are "unhealthy."  Pt reports that he typically eats 2-3 meals daily. Breakfast may include soft scrambled eggs. Lunch and dinner may include macaroni, spaghetti, or tomato soup with cheese and crackers. Pt reports enjoying soups and canned items. Pt shares that he eats out once weekly at a [place] and has 2-3 margaritas at that time. Pt states that he does most of the cooking.  Pt is aware of the need to improve his nutrition for his heart, stating that "my father had the same problem."  RD provided "Low Sodium Nutrition Therapy" handout from the Academy of [employer]. Reviewed patient's dietary recall. Provided examples on ways to decrease sodium intake in diet. Discouraged intake of processed foods and use of salt shaker. Encouraged fresh fruits and vegetables as well as whole grain sources of carbohydrates to maximize fiber intake.   RD discussed why it is important for patient to adhere to diet recommendations, and emphasized the role of fluids, foods to avoid, and importance of weighing self daily. Teach back method used.  Expect fair compliance.  Body mass index is 40.13 kg/m. Pt meets criteria for Obesity Class III based on current BMI.  Current diet order is Heart Healthy, patient is consuming approximately 100% of meals at this time. Labs and medications reviewed. No further nutrition interventions warranted at this time. RD contact information provided. If additional nutrition issues arise, please re-consult RD.    Gaynell Face, MS, RD, LDN Pager: [phone] Weekend/After Hours: [phone]

## 2017-10-18 LAB — BASIC METABOLIC PANEL
Anion gap: 11 (ref 5–15)
BUN: 12 mg/dL (ref 8–23)
CALCIUM: 9.2 mg/dL (ref 8.9–10.3)
CHLORIDE: 105 mmol/L (ref 98–111)
CO2: 24 mmol/L (ref 22–32)
CREATININE: 0.94 mg/dL (ref 0.61–1.24)
Glucose, Bld: 90 mg/dL (ref 70–99)
Potassium: 3.9 mmol/L (ref 3.5–5.1)
SODIUM: 140 mmol/L (ref 135–145)

## 2017-10-18 MED ORDER — FUROSEMIDE 40 MG PO TABS
40.0000 mg | ORAL_TABLET | Freq: Every day | ORAL | Status: DC
  Administered 2017-10-19 – 2017-10-21 (×3): 40 mg via ORAL
  Filled 2017-10-18 (×3): qty 1

## 2017-10-18 NOTE — Progress Notes (Signed)
PROGRESS NOTE    David Castaneda  QBH:[id number] DOB: 1950/04/05 DOA: 10/13/2017 PCP: Patient, No Pcp Per    Brief Narrative: 67 year old male with history of kidney stone, obesity, former smoker came to the hospital with chest pressure. Patient has not seen a physician past 30 years. Has been having intermittent chest pain for about a week. Also found to have elevated troponin 0.08, 0.11, 0.12, chest x-ray showed mild pulmonary edema and cardiomegaly.D-dimer was elevated to 1.09, CTA chest obtained which was negative for PE. BNP was elevated to 455.7  Patient was found to have new diagnosis of heart failure EF 15-20 %.    Assessment & Plan:   Principal Problem:   Chest pain Active Problems:   SOB (shortness of breath)   Acute CHF (congestive heart failure) (HCC)   Acute on chronic combined systolic and diastolic CHF (congestive heart failure) (HCC)   1-Acute systolic Heart failure;  HF team following.  Weight down to 260 from 280.  For Cardia MRI to rule infiltrative diseases.  He will need out patient sleep study.  On lasix, entresto, spironolactone.    2-Cirrhosis ? On CT ; Will check viral hepatitis panel  Check MRI liver.   3-Chest pain;  Cath; no significant CAD.       DVT prophylaxis: Heparin.  Code Status: full code.  Family Communication: care discussed with patient  Disposition Plan: discharge when clear by cardiology   Consultants:   Cardiology    Procedures:  ECHO;  - Left ventricle: The cavity size was mildly dilated. Wall   thickness was increased in a pattern of mild LVH. Systolic   function was severely reduced. The estimated ejection fraction   was in the range of 15% to 20%. Diffuse hypokinesis. Definity   contrast shows slow flow and swirling at the LV apex without   formed mural thrombus. Indeterminate diastolic function -   possible grade 2 dysfunction. - Aorta: Aortic root dimension: 47 mm (ED). - Aortic root: The aortic root was  moderately dilated. - Mitral valve: There was trivial regurgitation. - Left atrium: The atrium was mildly dilated. - Right atrium: Central venous pressure (est): 3 mm Hg. - Atrial septum: No defect or patent foramen ovale was identified. - Tricuspid valve: There was trivial regurgitation. - Pulmonary arteries: Systolic pressure could not be accurately   estimated. - Pericardium, extracardiac: There was no pericardial effusion.    Possible hepatic cyst visualized.     Antimicrobials:  none   Subjective: Breathing better, no chest pain.   Objective: Vitals:   10/17/17 2203 10/17/17 2325 10/18/17 0443 10/18/17 1206  BP: 105/70 104/71 110/70 105/65  Pulse: 82 81 87 74  Resp:   18 18  Temp:   98.3 F (36.8 C) 98.1 F (36.7 C)  TempSrc:   Oral Oral  SpO2:   98% 93%  Weight:   117.9 kg (260 lb)   Height:        Intake/Output Summary (Last 24 hours) at 10/18/2017 1508 Last data filed at 10/18/2017 0100 Gross per 24 hour  Intake -  Output 450 ml  Net -450 ml   Filed Weights   10/16/17 0430 10/17/17 0411 10/18/17 0443  Weight: 122 kg (268 lb 14.4 oz) 119.7 kg (263 lb 14.4 oz) 117.9 kg (260 lb)    Examination:  General exam: Appears calm and comfortable  Respiratory system: Clear to auscultation. Respiratory effort normal. Cardiovascular system: S1 & S2 heard, RRR. No JVD, murmurs, rubs, gallops or clicks.  No pedal edema. Gastrointestinal system: Abdomen is nondistended, soft and nontender. No organomegaly or masses felt. Normal bowel sounds heard. Central nervous system: Alert and oriented. No focal neurological deficits. Extremities: Symmetric 5 x 5 power. Skin: No rashes, lesions or ulcers   Data Reviewed: I have personally reviewed following labs and imaging studies  CBC: Recent Labs  Lab 10/13/17 2300 10/15/17 0629 10/16/17 0603  WBC 6.7 8.3 7.8  HGB 14.2 13.4 13.4  HCT 44.7 41.4 40.6  MCV 95.9 94.7 92.1  PLT 211 228 295   Basic Metabolic  Panel: Recent Labs  Lab 10/14/17 1130 10/15/17 0629 10/16/17 0603 10/17/17 0245 10/18/17 0453  NA 139 137 141 140 140  K 3.7 4.1 3.5 4.3 3.9  CL 101 102 103 105 105  CO2 25 25 27 25 24   GLUCOSE 107* 98 77 83 90  BUN 12 14 11 9 12   CREATININE 0.98 0.83 0.84 0.85 0.94  CALCIUM 9.2 8.9 9.2 9.2 9.2  MG 1.7 1.8  --  2.1  --    GFR: Estimated Creatinine Clearance: 92.5 mL/min (by C-G formula based on SCr of 0.94 mg/dL). Liver Function Tests: Recent Labs  Lab 10/16/17 1000  AST 81*  ALT 30   No results for input(s): LIPASE, AMYLASE in the last 168 hours. No results for input(s): AMMONIA in the last 168 hours. Coagulation Profile: Recent Labs  Lab 10/16/17 0603  INR 1.26   Cardiac Enzymes: Recent Labs  Lab 10/14/17 0112 10/14/17 0637 10/14/17 1130  TROPONINI 0.08* 0.11* 0.12*   BNP (last 3 results) No results for input(s): PROBNP in the last 8760 hours. HbA1C: No results for input(s): HGBA1C in the last 72 hours. CBG: No results for input(s): GLUCAP in the last 168 hours. Lipid Profile: No results for input(s): CHOL, HDL, LDLCALC, TRIG, CHOLHDL, LDLDIRECT in the last 72 hours. Thyroid Function Tests: Recent Labs    10/16/17 1000  TSH 0.750   Anemia Panel: No results for input(s): VITAMINB12, FOLATE, FERRITIN, TIBC, IRON, RETICCTPCT in the last 72 hours. Sepsis Labs: No results for input(s): PROCALCITON, LATICACIDVEN in the last 168 hours.  No results found for this or any previous visit (from the past 240 hour(s)).       Radiology Studies: No results found.      Scheduled Meds: . famotidine  20 mg Oral QHS  . furosemide  80 mg Intravenous BID  . [START ON 10/19/2017] furosemide  40 mg Oral Daily  . heparin  5,000 Units Subcutaneous Q8H  . mouth rinse  15 mL Mouth Rinse BID  . sacubitril-valsartan  1 tablet Oral BID  . sodium chloride flush  3 mL Intravenous Q12H  . spironolactone  25 mg Oral QHS   Continuous Infusions: . sodium chloride        LOS: 3 days    Time spent: 35 minutes.     Elmarie Shiley, MD [facility] Pager [phone]  If 7PM-7AM, please contact night-coverage [email or web address] Password [facility] 10/18/2017, 3:08 PM

## 2017-10-18 NOTE — Progress Notes (Addendum)
Benefit check for Entresto in progress. Mindi Slicker [facility] [phone]   RE: Benefit check  Received: Today  Message Contents  Memory Argue CMA        # 9.  S/W PHARMACIST @ [facility] # [phone]    ENTRESTO  97/103 MG BID  COVER- YES  CO-PAY- $ 47.00  TIER- NO  PRIOR APPROVAL- NO   PRE[facility]ERRED [facility]ARMACY : [facility]   Previous Messages    ----- Message -----  From: Memory Argue  Sent: 10/18/2017 11:37 AM  To: Memory Argue  Subject: FW: Benefit check

## 2017-10-18 NOTE — Progress Notes (Addendum)
Advanced Heart Failure Rounding Note  PCP-Cardiologist: No primary care provider on file.   Subjective:    Breathing and edema improved. Denies SOB, lightheadedness, or dizziness. No further chest pain. Plan cardiac MRI today.   I/O negative 1L. Down 3 lbs. Cr stable at 0.94. K 3.9.  R/LHC 10/16/17: Left Main  No coronary disease noted.  Left Anterior Descending  No coronary disease noted.  Ramus Intermedius  Moderate ramus. No coronary disease noted.  Left Circumflex  No coronary disease noted.  Right Coronary Artery  No coronary disease noted.   Right Heart Pressures RHC Procedural Findings: Hemodynamics (mmHg) RA mean 13 RV 48/18 PA 44/19, mean 31 PCWP mean 31  Oxygen saturations: PA 76% AO 97%  Cardiac Output (Fick) 8  Cardiac Index (Fick) 3.48   Echo 10/15/17: mild LVH, EF 15-20%, diffuse HK, ?grade 2 DD, aortic root moderately dilated, trivial MR, LA mildly dilated,trivial TR, no pericardial effusion  Objective:   Weight Range: 260 lb (117.9 kg) Body mass index is 39.53 kg/m.   Vital Signs:   Temp:  [97.7 F (36.5 C)-98.5 F (36.9 C)] 98.3 F (36.8 C) (07/17 0443) Pulse Rate:  [81-102] 87 (07/17 0443) Resp:  [18-20] 18 (07/17 0443) BP: (89-114)/(64-95) 110/70 (07/17 0443) SpO2:  [94 %-98 %] 98 % (07/17 0443) Weight:  [260 lb (117.9 kg)] 260 lb (117.9 kg) (07/17 0443) Last BM Date: 10/17/17  Weight change: Filed Weights   10/16/17 0430 10/17/17 0411 10/18/17 0443  Weight: 268 lb 14.4 oz (122 kg) 263 lb 14.4 oz (119.7 kg) 260 lb (117.9 kg)    Intake/Output:   Intake/Output Summary (Last 24 hours) at 10/18/2017 0820 Last data filed at 10/18/2017 0100 Gross per 24 hour  Intake 843 ml  Output 1715 ml  Net -872 ml      Physical Exam    General: Obese. No resp difficulty. HEENT: Normal Neck: Supple. JVP difficult. Carotids 2+ bilat; no bruits. No thyromegaly or nodule noted. Cor: PMI nondisplaced. RRR, No M/G/R noted Lungs: CTAB, normal  effort. Abdomen: Soft, non-tender, non-distended, no HSM. No bruits or masses. +BS  Extremities: No cyanosis, clubbing, or rash. Ted hose in place. 1+ ankle edema.  Neuro: Alert & orientedx3, cranial nerves grossly intact. moves all 4 extremities w/o difficulty. Affect pleasant   Telemetry   NSR 80-90s, personally reviewed.   EKG    No new tracings.    Labs    CBC Recent Labs    10/16/17 0603  WBC 7.8  HGB 13.4  HCT 40.6  MCV 92.1  PLT 588   Basic Metabolic Panel Recent Labs    10/17/17 0245 10/18/17 0453  NA 140 140  K 4.3 3.9  CL 105 105  CO2 25 24  GLUCOSE 83 90  BUN 9 12  CREATININE 0.85 0.94  CALCIUM 9.2 9.2  MG 2.1  --    Liver Function Tests Recent Labs    10/16/17 1000  AST 81*  ALT 30   No results for input(s): LIPASE, AMYLASE in the last 72 hours. Cardiac Enzymes No results for input(s): CKTOTAL, CKMB, CKMBINDEX, TROPONINI in the last 72 hours.  BNP: BNP (last 3 results) Recent Labs    10/14/17 0113  BNP 455.7*    ProBNP (last 3 results) No results for input(s): PROBNP in the last 8760 hours.   D-Dimer No results for input(s): DDIMER in the last 72 hours. Hemoglobin A1C No results for input(s): HGBA1C in the last 72 hours. Fasting  Lipid Panel No results for input(s): CHOL, HDL, LDLCALC, TRIG, CHOLHDL, LDLDIRECT in the last 72 hours. Thyroid Function Tests Recent Labs    10/16/17 1000  TSH 0.750    Other results:   Imaging    No results found.   Medications:     Scheduled Medications: . famotidine  20 mg Oral QHS  . furosemide  80 mg Intravenous BID  . heparin  5,000 Units Subcutaneous Q8H  . mouth rinse  15 mL Mouth Rinse BID  . sacubitril-valsartan  1 tablet Oral BID  . sodium chloride flush  3 mL Intravenous Q12H  . spironolactone  25 mg Oral QHS    Infusions: . sodium chloride      PRN Medications: sodium chloride, acetaminophen, calcium carbonate, dextromethorphan-guaiFENesin, hydrALAZINE, iopamidol,  ipratropium, levalbuterol, morphine injection, nitroGLYCERIN, ondansetron (ZOFRAN) IV, sodium chloride flush, zolpidem    Patient Profile   David Castaneda is a 67 y.o. male with a history of obesity and tobacco use. He last saw a doctor 30 years ago. He does not take any medications at home.  Admitted with CP and SOB and found to have EF 15-20%.    Assessment/Plan   1. Acute systolic heart failure. NICM. No recent virus. A1C 5.3. Add on TSH. He does snore and will need sleep study outpatient. [place] 10/16/17 with no CAD. [place] 10/16/17 with preserved CO and elevated filling pressures. He drinks 2-3 margaritas/week. No hx of heavy ETOH use in past. - Echo 10/15/17: EF 15-20%, grade 2 DD - Volume status remains elevated.  - Continue 80 mg IV lasix BID at least today.  - Continue spiro 25 mg daily. K 4.3, but received 80 meq supp yesterday.  - Continue Entresto 24/26 mg BID. Pressures too soft for increase. - Will need BB eventually.  - Continue TED hose - Cardiac rehab consulted.  - RD consulted - Plan cMRI today to look for infiltrative disease. He cannot lie flat yet.   2. Chest pain - LHC 7/15 with no CAD - DC aspirin with no CAD.  - LDL 85, but AST elevated to 81. Consider stopping statin.  - No change to current plan.    3. Tobacco use - Former cigarette smoker with 70 pack years. Now vaping. Encouraged cessation. No change.   4. HTN - Meds as above.   5. Cirrhosis  - Noted on CTA. AST elevated to 81, ALT 30. - Liver MRI recommended. Defer to primary. He only drinks 2-3 drinks/week. No hx of former heavy ETOH use.  - No change to current plan.    6. Obesity - Body mass index is 39.53 kg/m.  7. Snoring - Will need outpatient sleep study outpatient.   Medication concerns reviewed with patient and pharmacy team. Barriers identified: Needs a lot of education. Not sure if he has prescription coverage.   Length of Stay: 3  Annamaria Helling  10/18/2017, 8:20  AM  Advanced Heart Failure Team Pager [phone] (M-F; 7a - 4p)  Please contact [facility] for night-coverage after hours (4p -7a ) and weekends on [email or web address]  Patient seen with PA, agree with the above note.  Breathing getting better, weight down 3 lbs with IV Lasix.   On exam, JVP 8-9 range (difficult).  Reg S1S2.  Clear lungs.  No ankle edema.    1. Acute systolic CHF: Echo with EF 15-20%. Nonischemic cardiomyopathy, no significant CAD on coronary angiography today. No recent viral URI symptoms. TSH normal. No heavy ETOH history.  Possible viral myocarditis. RHC with preserved cardiac output and elevated filling pressures. Volume improving with IV Lasix.   - Will need cardiac MRI to look for infiltrative disease => hopefully today.  - Continue spironolactone 25 mg daily and Entresto 24/26 bid.   - Eventual addition of Coreg when better-diuresed, likely tomorrow if BP stable.  - Lasix 80 mg IV bid today, transition to po tomorrow.  2. Cirrhosis: Noted on CTA. Recommend liver MRI to followup cystic structures. ?Cause, denies heavy ETOH.  3. Snoring: Will need outpatient sleep study.  Loralie Champagne 10/18/2017 12:59 PM

## 2017-10-19 LAB — BASIC METABOLIC PANEL
Anion gap: 11 (ref 5–15)
BUN: 15 mg/dL (ref 8–23)
CO2: 28 mmol/L (ref 22–32)
CREATININE: 1.02 mg/dL (ref 0.61–1.24)
Calcium: 9.4 mg/dL (ref 8.9–10.3)
Chloride: 101 mmol/L (ref 98–111)
GFR calc Af Amer: >60 mL/min (ref >60)
Glucose, Bld: 99 mg/dL (ref 70–99)
Potassium: 3.8 mmol/L (ref 3.5–5.1)
SODIUM: 140 mmol/L (ref 135–145)

## 2017-10-19 MED ORDER — FAMOTIDINE 20 MG PO TABS: 20.0000 mg | ORAL_TABLET | Freq: Every day | ORAL | 0 refills | Status: DC

## 2017-10-19 MED ORDER — FUROSEMIDE 40 MG PO TABS: 40.0000 mg | ORAL_TABLET | Freq: Every day | ORAL | 0 refills | Status: DC

## 2017-10-19 MED ORDER — SACUBITRIL-VALSARTAN 24-26 MG PO TABS: 1.0000 | ORAL_TABLET | Freq: Two times a day (BID) | ORAL | 0 refills | Status: DC

## 2017-10-19 MED ORDER — SPIRONOLACTONE 25 MG PO TABS: 25.0000 mg | ORAL_TABLET | Freq: Every day | ORAL | 0 refills | Status: DC

## 2017-10-19 NOTE — Progress Notes (Signed)
Responded to scc to review and complete AD.  AD was done and  copies given to patient and for chosen agent. One was given to secretary to put in patient chart.  Will follow as needed.  Jaclynn Major, [place], [facility], Pager [phone]

## 2017-10-19 NOTE — Care Management Important Message (Signed)
Important Message  Patient Details  Name: David Castaneda MRN: [id number] Date of Birth: 1950-04-06   Medicare Important Message Given:  Yes    Rosita Guzzetta P [place] 10/19/2017, 1:18 PM

## 2017-10-19 NOTE — Discharge Summary (Addendum)
Physician Discharge Summary  David Castaneda XIP:[id number] DOB: [birth year] DOA: 10/13/2017  PCP: Patient, No Pcp Per  Admit date: 10/13/2017 Discharge date: 10/19/2017  Admitted From: Home  Disposition:  Home   Patient was not discharge, awaiting for test to be done.   Recommendations for Outpatient Follow-up:  1. Follow up with PCP in 1-2 weeks 2. Please obtain BMP/CBC in one week   Discharge Condition: Stable.  CODE STATUS: Full code.  Diet recommendation: Heart Healthy  Brief/Interim Summary:  Brief Narrative: 67 year old male with history of kidney stone, obesity, former smoker came to the hospital with chest pressure. Patient has not seen a physician past 30 years. Has been having intermittent chest pain for about a week. Also found to have elevated troponin 0.08, 0.11, 0.12, chest x-ray showed mild pulmonary edema and cardiomegaly.D-dimer was elevated to 1.09, CTA chest obtained which was negative for PE. BNP was elevated to 455.7  Patient was found to have new diagnosis of heart failure EF 15-20 %.    Assessment & Plan:   Principal Problem:   Chest pain Active Problems:   SOB (shortness of breath)   Acute CHF (congestive heart failure) (HCC)   Acute on chronic combined systolic and diastolic CHF (congestive heart failure) (HCC)   1-Acute systolic Heart failure;  HF team following.  Weight down to 260 from 280.  For Cardia MRI to rule infiltrative diseases.  He will need out patient sleep study.  On lasix, entresto, spironolactone. Stable for discharge.  Discharge today after cardiac MRI.     2-Cirrhosis ? On CT ; Will check viral hepatitis panel pending.  Check MRI liver.  he will need referral to GI, hepatologist.   3-Chest pain;  Cath; no significant CAD.     Discharge Diagnoses:  Principal Problem:   Chest pain Active Problems:   SOB (shortness of breath)   Acute CHF (congestive heart failure) (HCC)   Acute on chronic combined systolic  and diastolic CHF (congestive heart failure) [facility])    Discharge Instructions  Discharge Instructions    (HEART FAILURE PATIENTS) Call MD:  Anytime you have any of the following symptoms: 1) 3 pound weight gain in 24 hours or 5 pounds in 1 week 2) shortness of breath, with or without a dry hacking cough 3) swelling in the hands, feet or stomach 4) if you have to sleep on extra pillows at night in order to breathe.   Complete by:  As directed    Diet - low sodium heart healthy   Complete by:  As directed    Heart Failure patients record your daily weight using the same scale at the same time of day   Complete by:  As directed    Increase activity slowly   Complete by:  As directed    STOP any activity that causes chest pain, shortness of breath, dizziness, sweating, or exessive weakness   Complete by:  As directed      Allergies as of 10/19/2017   No Known Allergies     Medication List    STOP taking these medications   acetaminophen 500 MG tablet Commonly known as:  TYLENOL   BRONKAID 25-400 MG Tabs Generic drug:  ePHEDrine-guaiFENesin     TAKE these medications   aspirin 81 MG chewable tablet Chew 81 mg by mouth as needed for mild pain.   famotidine 20 MG tablet Commonly known as:  PEPCID Take 1 tablet (20 mg total) by mouth at bedtime.   furosemide 40  MG tablet Commonly known as:  LASIX Take 1 tablet (40 mg total) by mouth daily. Start taking on:  10/20/2017   sacubitril-valsartan 24-26 MG Commonly known as:  ENTRESTO Take 1 tablet by mouth 2 (two) times daily.   spironolactone 25 MG tablet Commonly known as:  ALDACTONE Take 1 tablet (25 mg total) by mouth at bedtime.       No Known Allergies  Consultations:  Cardiology    Procedures/Studies: Dg Chest 2 View  Result Date: 10/13/2017 CLINICAL DATA:  67 year old male with chest pain. EXAM: CHEST - 2 VIEW COMPARISON:  None FINDINGS: There is mild cardiomegaly with mild vascular congestion and small  bilateral pleural effusions. No focal consolidation, or pneumothorax. No acute osseous pathology. IMPRESSION: Cardiomegaly with mild CHF and small bilateral pleural effusions. Clinical correlation is recommended. Electronically Signed   By: Anner Crete M.D.   On: 10/13/2017 23:29   Ct Angio Chest Pe W Or Wo Contrast  Result Date: 10/14/2017 CLINICAL DATA:  67 year old male with positive D-dimer. Concern for pulmonary embolism. EXAM: CT ANGIOGRAPHY CHEST WITH CONTRAST TECHNIQUE: Multidetector CT imaging of the chest was performed using the standard protocol during bolus administration of intravenous contrast. Multiplanar CT image reconstructions and MIPs were obtained to evaluate the vascular anatomy. CONTRAST:  146mL ISOVUE-370 IOPAMIDOL (ISOVUE-370) INJECTION 76% COMPARISON:  Chest radiograph dated 10/13/2017 FINDINGS: Cardiovascular: There is mild cardiomegaly. No pericardial effusion. The thoracic aorta is unremarkable. There is no CT evidence of pulmonary embolism. Mediastinum/Nodes: No hilar or mediastinal adenopathy. Esophagus and the thyroid gland are grossly unremarkable. No mediastinal fluid collection. Lungs/Pleura: Bilateral small to moderate pleural effusions with associated partial compressive atelectasis of the lower lobes. Scattered nodular and hazy airspace densities in the left lower lobe may represent alveolar edema or pneumonia. Clinical correlation is recommended. There is diffuse interlobular septal prominence and edema. There is no pneumothorax. The central airways are patent. Upper Abdomen: Irregular hepatic contour with morphologic changes of cirrhosis. Multiple hypodense hepatic lesions are not characterized on this CT. The largest lesion in the right lobe of the liver measures 8 cm. These lesions demonstrate fluid attenuation, possibly cysts. However, there is apparent central enhancement or calcification of lesion in the right lobe of the liver (series 7, image 329). Further  characterization of the liver lesions with MRI is recommended. Musculoskeletal: Mild degenerative changes of the spine. No acute osseous pathology. Review of the MIP images confirms the above findings. IMPRESSION: 1. No CT evidence of pulmonary embolism. 2. Mild cardiomegaly with findings of CHF cyst and small to moderate bilateral pleural effusions. 3. Nodular and hazy airspace densities in the left lower lobe may represent alveolar edema or pneumonia. Clinical correlation is recommended. 4. Cirrhosis. Multiple hepatic hypodense lesions are not well characterized. Further evaluation with MRI recommended. Electronically Signed   By: Anner Crete M.D.   On: 10/14/2017 04:58      Subjective: Feeling well, breathing better   Discharge Exam: Vitals:   10/19/17 0556 10/19/17 0945  BP: (!) 95/48 (!) 95/44  Pulse: 63 68  Resp: 18   Temp: 98.4 F (36.9 C)   SpO2: 98%    Vitals:   10/18/17 2110 10/18/17 2253 10/19/17 0556 10/19/17 0945  BP: 98/66 104/70 (!) 95/48 (!) 95/44  Pulse: 89 74 63 68  Resp:   18   Temp:   98.4 F (36.9 C)   TempSrc:   Oral   SpO2:   98%   Weight:   116.8 kg (257 lb 6.4  oz)   Height:        General: Pt is alert, awake, not in acute distress Cardiovascular: RRR, S1/S2 +, no rubs, no gallops Respiratory: CTA bilaterally, no wheezing, no rhonchi Abdominal: Soft, NT, ND, bowel sounds + Extremities: no edema, no cyanosis    The results of significant diagnostics from this hospitalization (including imaging, microbiology, ancillary and laboratory) are listed below for reference.     Microbiology: No results found for this or any previous visit (from the past 240 hour(s)).   Labs: BNP (last 3 results) Recent Labs    10/14/17 0113  BNP 098.1*   Basic Metabolic Panel: Recent Labs  Lab 10/14/17 1130 10/15/17 0629 10/16/17 0603 10/17/17 0245 10/18/17 0453 10/19/17 0433  NA 139 137 141 140 140 140  K 3.7 4.1 3.5 4.3 3.9 3.8  CL 101 102 103 105  105 101  CO2 25 25 27 25 24 28   GLUCOSE 107* 98 77 83 90 99  BUN 12 14 11 9 12 15   CREATININE 0.98 0.83 0.84 0.85 0.94 1.02  CALCIUM 9.2 8.9 9.2 9.2 9.2 9.4  MG 1.7 1.8  --  2.1  --   --    Liver Function Tests: Recent Labs  Lab 10/16/17 1000  AST 81*  ALT 30   No results for input(s): LIPASE, AMYLASE in the last 168 hours. No results for input(s): AMMONIA in the last 168 hours. CBC: Recent Labs  Lab 10/13/17 2300 10/15/17 0629 10/16/17 0603  WBC 6.7 8.3 7.8  HGB 14.2 13.4 13.4  HCT 44.7 41.4 40.6  MCV 95.9 94.7 92.1  PLT 211 228 220   Cardiac Enzymes: Recent Labs  Lab 10/14/17 0112 10/14/17 0637 10/14/17 1130  TROPONINI 0.08* 0.11* 0.12*   BNP: Invalid input(s): POCBNP CBG: No results for input(s): GLUCAP in the last 168 hours. D-Dimer No results for input(s): DDIMER in the last 72 hours. Hgb A1c No results for input(s): HGBA1C in the last 72 hours. Lipid Profile No results for input(s): CHOL, HDL, LDLCALC, TRIG, CHOLHDL, LDLDIRECT in the last 72 hours. Thyroid function studies No results for input(s): TSH, T4TOTAL, T3FREE, THYROIDAB in the last 72 hours.  Invalid input(s): FREET3 Anemia work up No results for input(s): VITAMINB12, FOLATE, FERRITIN, TIBC, IRON, RETICCTPCT in the last 72 hours. Urinalysis No results found for: COLORURINE, APPEARANCEUR, LABSPEC, [place], GLUCOSEU, HGBUR, BILIRUBINUR, KETONESUR, PROTEINUR, UROBILINOGEN, NITRITE, LEUKOCYTESUR Sepsis Labs Invalid input(s): PROCALCITONIN,  WBC,  LACTICIDVEN Microbiology No results found for this or any previous visit (from the past 240 hour(s)).   Time coordinating discharge: 35 minutes.   SIGNED:   Elmarie Shiley, MD  [facility] 10/19/2017, 10:51 AM Pager   If 7PM-7AM, please contact night-coverage [email or web address] Password [email or web address]

## 2017-10-19 NOTE — Progress Notes (Signed)
[phone] Offered pt CRP 2 and he declined at this time. Encouraged pt to walk as tolerated at home. He had not eaten lunch as he was sleeping when I arrived. Offered to walk. Pt stated he has been up to bathroom. Set up lunch as pt wants to eat at this time. Graylon Good RN BSN 10/19/2017 1:33 PM

## 2017-10-19 NOTE — Plan of Care (Signed)
  Problem: Activity: Goal: Risk for activity intolerance will decrease Outcome: Progressing Problem: Skin Integrity: Goal: Risk for impaired skin integrity will decrease Outcome: Progressing     Problem: Coping: Goal: Level of anxiety will decrease Outcome: Progressing

## 2017-10-19 NOTE — Progress Notes (Signed)
Per MRI, machine is being worked on for patient. Pt will go to MRI when machine is fixed.

## 2017-10-19 NOTE — Progress Notes (Addendum)
Advanced Heart Failure Rounding Note  PCP-Cardiologist: No primary care provider on file.   Subjective:    Awaiting MRI. Problems with table yesterday led to deferment.   Feeling better this am. Denies SOB with ambulating. No dizziness or lightheadedness.   I/O inaccurate. Weight down 3 lbs. Cr stable at 1.02. K 3.8.   R/LHC 10/16/17: Left Main  No coronary disease noted.  Left Anterior Descending  No coronary disease noted.  Ramus Intermedius  Moderate ramus. No coronary disease noted.  Left Circumflex  No coronary disease noted.  Right Coronary Artery  No coronary disease noted.   Right Heart Pressures RHC Procedural Findings: Hemodynamics (mmHg) RA mean 13 RV 48/18 PA 44/19, mean 31 PCWP mean 31  Oxygen saturations: PA 76% AO 97%  Cardiac Output (Fick) 8  Cardiac Index (Fick) 3.48   Echo 10/15/17: mild LVH, EF 15-20%, diffuse HK, ?grade 2 DD, aortic root moderately dilated, trivial MR, LA mildly dilated,trivial TR, no pericardial effusion  Objective:   Weight Range: 257 lb 6.4 oz (116.8 kg) Body mass index is 39.14 kg/m.   Vital Signs:   Temp:  [97.9 F (36.6 C)-98.4 F (36.9 C)] 98.4 F (36.9 C) (07/18 0556) Pulse Rate:  [63-96] 63 (07/18 0556) Resp:  [18] 18 (07/18 0556) BP: (95-129)/(48-74) 95/48 (07/18 0556) SpO2:  [93 %-98 %] 98 % (07/18 0556) Weight:  [257 lb 6.4 oz (116.8 kg)] 257 lb 6.4 oz (116.8 kg) (07/18 0556) Last BM Date: 10/17/17  Weight change: Filed Weights   10/17/17 0411 10/18/17 0443 10/19/17 0556  Weight: 263 lb 14.4 oz (119.7 kg) 260 lb (117.9 kg) 257 lb 6.4 oz (116.8 kg)    Intake/Output:   Intake/Output Summary (Last 24 hours) at 10/19/2017 0831 Last data filed at 10/19/2017 0500 Gross per 24 hour  Intake 0 ml  Output 400 ml  Net -400 ml      Physical Exam    General: NAD HEENT: Normal Neck: Supple. JVP difficult. Carotids 2+ bilat; no bruits. No thyromegaly or nodule noted. Cor: PMI nondisplaced. RRR, No  M/G/R noted Lungs: CTAB, normal effort. Abdomen: Soft, non-tender, non-distended, no HSM. No bruits or masses. +BS  Extremities: No cyanosis, clubbing, or rash. Ted hose in place.  Neuro: Alert & orientedx3, cranial nerves grossly intact. moves all 4 extremities w/o difficulty. Affect pleasant   Telemetry   NSR 80-90s, personally reviewed.   EKG    No new tracings.    Labs    CBC No results for input(s): WBC, NEUTROABS, HGB, HCT, MCV, PLT in the last 72 hours. Basic Metabolic Panel Recent Labs    10/17/17 0245 10/18/17 0453 10/19/17 0433  NA 140 140 140  K 4.3 3.9 3.8  CL 105 105 101  CO2 25 24 28   GLUCOSE 83 90 99  BUN 9 12 15   CREATININE 0.85 0.94 1.02  CALCIUM 9.2 9.2 9.4  MG 2.1  --   --    Liver Function Tests Recent Labs    10/16/17 1000  AST 81*  ALT 30   No results for input(s): LIPASE, AMYLASE in the last 72 hours. Cardiac Enzymes No results for input(s): CKTOTAL, CKMB, CKMBINDEX, TROPONINI in the last 72 hours.  BNP: BNP (last 3 results) Recent Labs    10/14/17 0113  BNP 455.7*    ProBNP (last 3 results) No results for input(s): PROBNP in the last 8760 hours.   D-Dimer No results for input(s): DDIMER in the last 72 hours. Hemoglobin A1C  No results for input(s): HGBA1C in the last 72 hours. Fasting Lipid Panel No results for input(s): CHOL, HDL, LDLCALC, TRIG, CHOLHDL, LDLDIRECT in the last 72 hours. Thyroid Function Tests Recent Labs    10/16/17 1000  TSH 0.750    Other results:   Imaging    No results found.   Medications:     Scheduled Medications: . famotidine  20 mg Oral QHS  . furosemide  40 mg Oral Daily  . heparin  5,000 Units Subcutaneous Q8H  . mouth rinse  15 mL Mouth Rinse BID  . sacubitril-valsartan  1 tablet Oral BID  . sodium chloride flush  3 mL Intravenous Q12H  . spironolactone  25 mg Oral QHS    Infusions: . sodium chloride      PRN Medications: sodium chloride, acetaminophen, calcium  carbonate, dextromethorphan-guaiFENesin, hydrALAZINE, iopamidol, ipratropium, levalbuterol, morphine injection, nitroGLYCERIN, ondansetron (ZOFRAN) IV, sodium chloride flush, zolpidem    Patient Profile   David Castaneda is a 67 y.o. male with a history of obesity and tobacco use. He last saw a doctor 30 years ago. He does not take any medications at home.  Admitted with CP and SOB and found to have EF 15-20%.    Assessment/Plan   1. Acute systolic heart failure. NICM. No recent virus. A1C 5.3. Add on TSH. He does snore and will need sleep study outpatient. [place] 10/16/17 with no CAD. [place] 10/16/17 with preserved CO and elevated filling pressures. He drinks 2-3 margaritas/week. No hx of heavy ETOH use in past. - Echo 10/15/17: EF 15-20%, grade 2 DD - Volume status improved.  - Start lasix 40 mg daily today.  - Continue spiro 25 mg daily. - Continue Entresto 24/26 mg BID.  - Will need BB eventually. Pressures too soft to add today.  - Continue TED hose - Cardiac rehab consulted.  - RD consulted - Plan cMRI today to look for infiltrative disease.   2. Chest pain - LHC 7/15 with no CAD - DC aspirin with no CAD.  - LDL 85, but AST elevated to 81. Consider stopping statin.  - No change to current plan.    3. Tobacco use - Former cigarette smoker with 70 pack years. Now vaping. Encouraged cessation. No change.   4. HTN - Meds as above.   5. Cirrhosis  - Noted on CTA. AST elevated to 81, ALT 30. - Liver MRI recommended. Defer to primary. He only drinks 2-3 drinks/week. No hx of former heavy ETOH use.  - No change to current plan.    6. Obesity - Body mass index is 39.14 kg/m.  7. Snoring - Will need outpatient sleep study outpatient. No change.  Medication concerns reviewed with patient and pharmacy team. Barriers identified: Needs a lot of education. Not sure if he has prescription coverage.   Length of Stay: 4  Annamaria Helling  10/19/2017, 8:31 AM  Advanced  Heart Failure Team Pager [phone] (M-F; 7a - 4p)  Please contact [facility] for night-coverage after hours (4p -7a ) and weekends on [email or web address]  Patient seen with PA, agree with the above note.  Breathing getting better, weight down again with IV Lasix.   On exam, JVP not elevated. Reg S1S2. Clear lungs.  No ankle edema.   1. Acute systolic CHF: Echo with EF 15-20%. Nonischemic cardiomyopathy, no significant CAD on coronary angiography today. No recent viral URI symptoms. TSH normal.No heavy ETOH history. Possible viral myocarditis. RHC with preserved cardiac output and elevated filling  pressures. Volume improving with IV Lasix.   - Will need cardiac MRI to look for infiltrative disease=> hopefully today.  -Continuespironolactone 25 mg daily and Entresto 24/26 bid.   - Eventual addition of Coreg, SBP in 90s this morning so will not start yet.  - Transition to Lasix 40 mg po daily.  2. Cirrhosis: Noted on CTA. Recommend liver MRI to followup cystic structures. ?Cause,denies heavy ETOH.  3. Snoring: Will need outpatient sleep study. 4. Can stop statin with no significant CAD.   I think he could go home after MRI from my standpoint.  We will arrange followup in CHF clinic.  He will need Lasix 40 mg daily, spironolactone 25 mg daily, Entresto 24/26 bid.  We will try to start Coreg as outpatient (versus tomorrow am if he does not go home today).   Loralie Champagne 10/19/2017 9:53 AM

## 2017-10-19 NOTE — Progress Notes (Signed)
Pt was concerned about his sciatic nerve pain, states has been having this for a while, wanted to see a specialist here or outpatient about this, Thanks Arvella Nigh RN.

## 2017-10-19 NOTE — Progress Notes (Signed)
MRI called regarding testing ETA.

## 2017-10-19 NOTE — Progress Notes (Signed)
CM talked to patient about PCP; patient stated that he goes to the Urgent [facility] in [place] / Dr Lynnette Caffey and he plans to talk to him about a referral. Entresto coupon card given to patient with instructions of usage. Mindi Slicker [facility] [phone]

## 2017-10-19 NOTE — Progress Notes (Signed)
1530: Charge nurse called MRI regarding testing. Machine is still broken per staff. Unsure when machine will be fixed. MRI RN unavailable from 7p-7a.   Primary and cardiology made aware. Discharge order discontinued.

## 2017-10-20 ENCOUNTER — Inpatient Hospital Stay (HOSPITAL_COMMUNITY): Payer: Medicare Other

## 2017-10-20 DIAGNOSIS — I428 Other cardiomyopathies: Secondary | ICD-10-CM

## 2017-10-20 LAB — HEPATITIS PANEL, ACUTE
HEP B C IGM: NEGATIVE
HEP B S AG: NEGATIVE
Hep A IgM: NEGATIVE

## 2017-10-20 LAB — BASIC METABOLIC PANEL
Anion gap: 13 (ref 5–15)
BUN: 18 mg/dL (ref 8–23)
CALCIUM: 9.2 mg/dL (ref 8.9–10.3)
CO2: 25 mmol/L (ref 22–32)
CREATININE: 0.9 mg/dL (ref 0.61–1.24)
Chloride: 100 mmol/L (ref 98–111)
GFR calc Af Amer: >60 mL/min (ref >60)
Glucose, Bld: 81 mg/dL (ref 70–99)
Potassium: 4 mmol/L (ref 3.5–5.1)
SODIUM: 138 mmol/L (ref 135–145)

## 2017-10-20 MED ORDER — CARVEDILOL 3.125 MG PO TABS
3.1250 mg | ORAL_TABLET | Freq: Two times a day (BID) | ORAL | Status: DC
  Administered 2017-10-20: 3.125 mg via ORAL
  Filled 2017-10-20: qty 1

## 2017-10-20 MED ORDER — CARVEDILOL 3.125 MG PO TABS: 3.1250 mg | ORAL_TABLET | Freq: Two times a day (BID) | ORAL | 6 refills | Status: DC

## 2017-10-20 MED ORDER — LOSARTAN POTASSIUM 25 MG PO TABS: 25.0000 mg | ORAL_TABLET | Freq: Every day | ORAL | Status: DC

## 2017-10-20 MED ORDER — GADOBENATE DIMEGLUMINE 529 MG/ML IV SOLN
32.0000 mL | Freq: Once | INTRAVENOUS | Status: AC | PRN
  Administered 2017-10-20: 32 mL via INTRAVENOUS

## 2017-10-20 MED ORDER — LOSARTAN POTASSIUM 25 MG PO TABS: 25.0000 mg | ORAL_TABLET | Freq: Every day | ORAL | 6 refills | Status: DC

## 2017-10-20 NOTE — Progress Notes (Signed)
patient transported to MRI for cardiac study.

## 2017-10-20 NOTE — Progress Notes (Addendum)
Advanced Heart Failure Rounding Note  PCP-Cardiologist: No primary care provider on file.   Subjective:    cMRI done this am. Read pending.   No Entresto given yesterday with SBP in 90s.   Feeling good this am. Denies SOB. Denies lightheadedness or dizziness.   Cr and K stable. Weight shows up 2 lbs, I/O negative.   R/LHC 10/16/17: Left Main  No coronary disease noted.  Left Anterior Descending  No coronary disease noted.  Ramus Intermedius  Moderate ramus. No coronary disease noted.  Left Circumflex  No coronary disease noted.  Right Coronary Artery  No coronary disease noted.   Right Heart Pressures RHC Procedural Findings: Hemodynamics (mmHg) RA mean 13 RV 48/18 PA 44/19, mean 31 PCWP mean 31  Oxygen saturations: PA 76% AO 97%  Cardiac Output (Fick) 8  Cardiac Index (Fick) 3.48   Echo 10/15/17: mild LVH, EF 15-20%, diffuse HK, ?grade 2 DD, aortic root moderately dilated, trivial MR, LA mildly dilated,trivial TR, no pericardial effusion  Objective:   Weight Range: 259 lb 3.2 oz (117.6 kg) Body mass index is 39.41 kg/m.   Vital Signs:   Temp:  [97.6 F (36.4 C)-98.2 F (36.8 C)] 98.2 F (36.8 C) (07/19 1000) Pulse Rate:  [80-86] 80 (07/19 1000) Resp:  [16-18] 18 (07/19 1000) BP: (91-112)/(57-80) 112/80 (07/19 1000) SpO2:  [94 %-98 %] 94 % (07/19 1000) Weight:  [259 lb 3.2 oz (117.6 kg)] 259 lb 3.2 oz (117.6 kg) (07/19 0618) Last BM Date: 10/18/17  Weight change: Filed Weights   10/18/17 0443 10/19/17 0556 10/20/17 0618  Weight: 260 lb (117.9 kg) 257 lb 6.4 oz (116.8 kg) 259 lb 3.2 oz (117.6 kg)    Intake/Output:   Intake/Output Summary (Last 24 hours) at 10/20/2017 1039 Last data filed at 10/20/2017 1000 Gross per 24 hour  Intake 800 ml  Output 855 ml  Net -55 ml      Physical Exam    General: Well appearing. No resp difficulty. HEENT: Normal Neck: Supple. JVP 5-6. Carotids 2+ bilat; no bruits. No thyromegaly or nodule noted. Cor:  PMI nondisplaced. RRR, No M/G/R noted Lungs: CTAB, normal effort. Abdomen: Soft, non-tender, non-distended, no HSM. No bruits or masses. +BS  Extremities: No cyanosis, clubbing, or rash. R and LLE no edema.  Neuro: Alert & orientedx3, cranial nerves grossly intact. moves all 4 extremities w/o difficulty. Affect pleasant   Telemetry   NSR 80s, personally reviewed.   EKG    No new tracings.    Labs    CBC No results for input(s): WBC, NEUTROABS, HGB, HCT, MCV, PLT in the last 72 hours. Basic Metabolic Panel Recent Labs    10/19/17 0433 10/20/17 0558  NA 140 138  K 3.8 4.0  CL 101 100  CO2 28 25  GLUCOSE 99 81  BUN 15 18  CREATININE 1.02 0.90  CALCIUM 9.4 9.2   Liver Function Tests No results for input(s): AST, ALT, ALKPHOS, BILITOT, PROT, ALBUMIN in the last 72 hours. No results for input(s): LIPASE, AMYLASE in the last 72 hours. Cardiac Enzymes No results for input(s): CKTOTAL, CKMB, CKMBINDEX, TROPONINI in the last 72 hours.  BNP: BNP (last 3 results) Recent Labs    10/14/17 0113  BNP 455.7*    ProBNP (last 3 results) No results for input(s): PROBNP in the last 8760 hours.   D-Dimer No results for input(s): DDIMER in the last 72 hours. Hemoglobin A1C No results for input(s): HGBA1C in the last 72 hours.  Fasting Lipid Panel No results for input(s): CHOL, HDL, LDLCALC, TRIG, CHOLHDL, LDLDIRECT in the last 72 hours. Thyroid Function Tests No results for input(s): TSH, T4TOTAL, T3FREE, THYROIDAB in the last 72 hours.  Invalid input(s): FREET3  Other results:   Imaging    No results found.   Medications:     Scheduled Medications: . famotidine  20 mg Oral QHS  . furosemide  40 mg Oral Daily  . heparin  5,000 Units Subcutaneous Q8H  . mouth rinse  15 mL Mouth Rinse BID  . sacubitril-valsartan  1 tablet Oral BID  . sodium chloride flush  3 mL Intravenous Q12H  . spironolactone  25 mg Oral QHS    Infusions: . sodium chloride      PRN  Medications: sodium chloride, acetaminophen, calcium carbonate, dextromethorphan-guaiFENesin, hydrALAZINE, iopamidol, ipratropium, levalbuterol, morphine injection, nitroGLYCERIN, ondansetron (ZOFRAN) IV, sodium chloride flush, zolpidem    Patient Profile   David Castaneda is a 67 y.o. male with a history of obesity and tobacco use. He last saw a doctor 30 years ago. He does not take any medications at home.  Admitted with CP and SOB and found to have EF 15-20%.    Assessment/Plan   1. Acute systolic heart failure. NICM. No recent virus. A1C 5.3. Add on TSH. He does snore and will need sleep study outpatient. [place] 10/16/17 with no CAD. [place] 10/16/17 with preserved CO and elevated filling pressures. He drinks 2-3 margaritas/week. No hx of heavy ETOH use in past. - Echo 10/15/17: EF 15-20%, grade 2 DD - Volume status stable.  - Continue lasix 40 mg daily.  - Continue spiro 25 mg daily. - Stop Entresto with soft pressures. Start losartan 25 mg qhs.  - Start coreg 3.125 mg BID.  - Continue TED hose - Cardiac rehab consulted.  - RD consulted - cMRI results pending.   2. Chest pain - LHC 7/15 with no CAD - DC aspirin with no CAD.  - LDL 85, but AST elevated to 81. Consider stopping statin.  - No change to current plan.    3. Tobacco use - Former cigarette smoker with 70 pack years. Now vaping. Encouraged cessation. No change.   4. HTN - Soft on meds as above. Will discuss possible switch to losartan with MD.   5. Cirrhosis  - Noted on CTA. AST elevated to 81, ALT 30. - Liver MRI recommended. Defer to primary. He only drinks 2-3 drinks/week. No hx of former heavy ETOH use.  - No change to current plan.      6. Obesity - Body mass index is 39.41 kg/m.  7. Snoring - Will need outpatient sleep study outpatient. No change.   Medication concerns reviewed with patient and pharmacy team. Barriers identified: Needs a lot of education. Not sure if he has prescription coverage.    Length of Stay: [place], [place]  10/20/2017, 10:39 AM  Advanced Heart Failure Team Pager [phone] (M-F; 7a - 4p)  Please contact [facility] for night-coverage after hours (4p -7a ) and weekends on [email or web address]  Patient seen with PA, agree with the above note.  No dyspnea, feels good, no lightheadedness.  However, SBP dropped down into the 70s this afternoon.  He got Coreg this morning.  Creatinine is stable.   On exam, JVP not elevated. Reg S1S2. Clear lungs.  No ankle edema.   1. Acute systolic CHF: Echo with EF 15-20%. Nonischemic cardiomyopathy, no significant CAD on coronary angiography today. No recent  viral URI symptoms. TSH normal.No heavy ETOH history. Possible viral myocarditis. RHC with preserved cardiac output and elevated filling pressures. Volume improved with IV Lasix.   - Cardiac MRI done today, will read later today.   -Hold spironolactone, losartan, and Coreg for now.  BP seems to have dropped with Coreg.  Would not give IV fluid as not lightheaded and creatinine normal.  Encourage po hydration.   - Reasess in am, probably start back on low dose losartan/spironolactone for home, hold off on Coreg until later.  - Lasix 40 mg daily for now.   2. Cirrhosis: Noted on CTA. Pending liver MRI to followup cystic structures. ?Cause,denies heavy ETOH.  3. Snoring: Will need outpatient sleep study. 4. Can stop statin with no significant CAD.   Would watch him overnight with low BP, probably home tomorrow if BP stabilizes.  Would hold off on Coreg for now and would try to get back on losartan 12.5 daily/spironolactone 12.5 daily tomorrow.   Loralie Champagne 10/20/2017 2:10 PM

## 2017-10-20 NOTE — Discharge Summary (Signed)
Physician Discharge Summary  David Castaneda FAO:[id number] DOB: 1950/12/02 DOA: 10/13/2017  PCP: Patient, No Pcp Per  Admit date: 10/13/2017 Discharge date: 10/20/2017  Admitted From: Home  Disposition:  Home   Recommendations for Outpatient Follow-up:  1. Follow up with PCP in 1-2 weeks 2. Please obtain BMP/CBC in one week   Discharge Condition: Stable.  CODE STATUS: Full code.  Diet recommendation: Heart Healthy  Brief/Interim Summary:  Brief Narrative: 67 year old male with history of kidney stone, obesity, former smoker came to the hospital with chest pressure. Patient has not seen a physician past 30 years. Has been having intermittent chest pain for about a week. Also found to have elevated troponin 0.08, 0.11, 0.12, chest x-ray showed mild pulmonary edema and cardiomegaly.D-dimer was elevated to 1.09, CTA chest obtained which was negative for PE. BNP was elevated to 455.7  Patient was found to have new diagnosis of heart failure EF 15-20 %.    Assessment & Plan:   Principal Problem:   Chest pain Active Problems:   SOB (shortness of breath)   Acute CHF (congestive heart failure) (HCC)   Acute on chronic combined systolic and diastolic CHF (congestive heart failure) (HCC)   1-Acute systolic Heart failure;  HF team following.  Weight down to 260 from 280.  For Cardia MRI to rule infiltrative diseases.  He will need out patient sleep study.  Discharge delay yesterday , awaiting for cardia MRI.  Medications changes today. entresto stop due to hypotension, didn't received dose yesterday.  Started on cozaar and carvedilol today.  Plan to discharge today, stable for discharge     2-Cirrhosis ? On CT ; Hepatitis panel negative MRI liver pending.  he will need referral to GI, hepatologist.   3-Chest pain;  Cath; no significant CAD.     Discharge Diagnoses:  Principal Problem:   Chest pain Active Problems:   SOB (shortness of breath)   Acute CHF  (congestive heart failure) (HCC)   Acute on chronic combined systolic and diastolic CHF (congestive heart failure) [facility])    Discharge Instructions  Discharge Instructions    (HEART FAILURE PATIENTS) Call MD:  Anytime you have any of the following symptoms: 1) 3 pound weight gain in 24 hours or 5 pounds in 1 week 2) shortness of breath, with or without a dry hacking cough 3) swelling in the hands, feet or stomach 4) if you have to sleep on extra pillows at night in order to breathe.   Complete by:  As directed    (HEART FAILURE PATIENTS) Call MD:  Anytime you have any of the following symptoms: 1) 3 pound weight gain in 24 hours or 5 pounds in 1 week 2) shortness of breath, with or without a dry hacking cough 3) swelling in the hands, feet or stomach 4) if you have to sleep on extra pillows at night in order to breathe.   Complete by:  As directed    Diet - low sodium heart healthy   Complete by:  As directed    Diet - low sodium heart healthy   Complete by:  As directed    Heart Failure patients record your daily weight using the same scale at the same time of day   Complete by:  As directed    Heart Failure patients record your daily weight using the same scale at the same time of day   Complete by:  As directed    Increase activity slowly   Complete by:  As directed  Increase activity slowly   Complete by:  As directed    STOP any activity that causes chest pain, shortness of breath, dizziness, sweating, or exessive weakness   Complete by:  As directed    STOP any activity that causes chest pain, shortness of breath, dizziness, sweating, or exessive weakness   Complete by:  As directed      Allergies as of 10/20/2017   No Known Allergies     Medication List    STOP taking these medications   acetaminophen 500 MG tablet Commonly known as:  TYLENOL   BRONKAID 25-400 MG Tabs Generic drug:  ePHEDrine-guaiFENesin     TAKE these medications   aspirin 81 MG chewable  tablet Chew 81 mg by mouth as needed for mild pain.   carvedilol 3.125 MG tablet Commonly known as:  COREG Take 1 tablet (3.125 mg total) by mouth 2 (two) times daily with a meal.   famotidine 20 MG tablet Commonly known as:  PEPCID Take 1 tablet (20 mg total) by mouth at bedtime.   furosemide 40 MG tablet Commonly known as:  LASIX Take 1 tablet (40 mg total) by mouth daily.   losartan 25 MG tablet Commonly known as:  COZAAR Take 1 tablet (25 mg total) by mouth at bedtime.   spironolactone 25 MG tablet Commonly known as:  ALDACTONE Take 1 tablet (25 mg total) by mouth at bedtime.      Follow-up Information    [facility] Follow up on 10/31/2017.   Specialty:  Cardiology Why:  Heart Failure Followup at [facility]-2pm-Parking at ER lot (enter under blue "Specialty Clinics" awning) or under [place] ([place], [address], elevator 1st floor). Take all am meds, bring all med bottles. Contact information: [address] [id number] [place]         No Known Allergies  Consultations:  Cardiology    Procedures/Studies: Dg Chest 2 View  Result Date: 10/13/2017 CLINICAL DATA:  67 year old male with chest pain. EXAM: CHEST - 2 VIEW COMPARISON:  None FINDINGS: There is mild cardiomegaly with mild vascular congestion and small bilateral pleural effusions. No focal consolidation, or pneumothorax. No acute osseous pathology. IMPRESSION: Cardiomegaly with mild CHF and small bilateral pleural effusions. Clinical correlation is recommended. Electronically Signed   By: Anner Crete M.D.   On: 10/13/2017 23:29   Ct Angio Chest Pe W Or Wo Contrast  Result Date: 10/14/2017 CLINICAL DATA:  67 year old male with positive D-dimer. Concern for pulmonary embolism. EXAM: CT ANGIOGRAPHY CHEST WITH CONTRAST TECHNIQUE: Multidetector CT imaging of the chest was performed  using the standard protocol during bolus administration of intravenous contrast. Multiplanar CT image reconstructions and MIPs were obtained to evaluate the vascular anatomy. CONTRAST:  172mL ISOVUE-370 IOPAMIDOL (ISOVUE-370) INJECTION 76% COMPARISON:  Chest radiograph dated 10/13/2017 FINDINGS: Cardiovascular: There is mild cardiomegaly. No pericardial effusion. The thoracic aorta is unremarkable. There is no CT evidence of pulmonary embolism. Mediastinum/Nodes: No hilar or mediastinal adenopathy. Esophagus and the thyroid gland are grossly unremarkable. No mediastinal fluid collection. Lungs/Pleura: Bilateral small to moderate pleural effusions with associated partial compressive atelectasis of the lower lobes. Scattered nodular and hazy airspace densities in the left lower lobe may represent alveolar edema or pneumonia. Clinical correlation is recommended. There is diffuse interlobular septal prominence and edema. There is no pneumothorax. The central airways are patent. Upper Abdomen: Irregular hepatic contour with morphologic changes of cirrhosis. Multiple hypodense hepatic lesions  are not characterized on this CT. The largest lesion in the right lobe of the liver measures 8 cm. These lesions demonstrate fluid attenuation, possibly cysts. However, there is apparent central enhancement or calcification of lesion in the right lobe of the liver (series 7, image 329). Further characterization of the liver lesions with MRI is recommended. Musculoskeletal: Mild degenerative changes of the spine. No acute osseous pathology. Review of the MIP images confirms the above findings. IMPRESSION: 1. No CT evidence of pulmonary embolism. 2. Mild cardiomegaly with findings of CHF cyst and small to moderate bilateral pleural effusions. 3. Nodular and hazy airspace densities in the left lower lobe may represent alveolar edema or pneumonia. Clinical correlation is recommended. 4. Cirrhosis. Multiple hepatic hypodense lesions are  not well characterized. Further evaluation with MRI recommended. Electronically Signed   By: Anner Crete M.D.   On: 10/14/2017 04:58     Subjective: Feeling well, breathing better   Discharge Exam: Vitals:   10/20/17 0618 10/20/17 1000  BP: 103/74 112/80  Pulse: 80 80  Resp: 18 18  Temp: 98 F (36.7 C) 98.2 F (36.8 C)  SpO2: 98% 94%   Vitals:   10/19/17 2200 10/19/17 2336 10/20/17 0618 10/20/17 1000  BP: 91/64 93/68 103/74 112/80  Pulse: 86 85 80 80  Resp: 18  18 18   Temp: 97.6 F (36.4 C)  98 F (36.7 C) 98.2 F (36.8 C)  TempSrc: Oral  Oral Oral  SpO2: 95%  98% 94%  Weight:   117.6 kg (259 lb 3.2 oz)   Height:        General: NAD Cardiovascular: S 1, S 2 RRR Respiratory: CTA Abdominal: Soft, nt, nd Extremities: No edema    The results of significant diagnostics from this hospitalization (including imaging, microbiology, ancillary and laboratory) are listed below for reference.     Microbiology: No results found for this or any previous visit (from the past 240 hour(s)).   Labs: BNP (last 3 results) Recent Labs    10/14/17 0113  BNP 956.2*   Basic Metabolic Panel: Recent Labs  Lab 10/14/17 1130 10/15/17 0629 10/16/17 0603 10/17/17 0245 10/18/17 0453 10/19/17 0433 10/20/17 0558  NA 139 137 141 140 140 140 138  K 3.7 4.1 3.5 4.3 3.9 3.8 4.0  CL 101 102 103 105 105 101 100  CO2 25 25 27 25 24 28 25   GLUCOSE 107* 98 77 83 90 99 81  BUN 12 14 11 9 12 15 18   CREATININE 0.98 0.83 0.84 0.85 0.94 1.02 0.90  CALCIUM 9.2 8.9 9.2 9.2 9.2 9.4 9.2  MG 1.7 1.8  --  2.1  --   --   --    Liver Function Tests: Recent Labs  Lab 10/16/17 1000  AST 81*  ALT 30   No results for input(s): LIPASE, AMYLASE in the last 168 hours. No results for input(s): AMMONIA in the last 168 hours. CBC: Recent Labs  Lab 10/13/17 2300 10/15/17 0629 10/16/17 0603  WBC 6.7 8.3 7.8  HGB 14.2 13.4 13.4  HCT 44.7 41.4 40.6  MCV 95.9 94.7 92.1  PLT 211 228 220    Cardiac Enzymes: Recent Labs  Lab 10/14/17 0112 10/14/17 0637 10/14/17 1130  TROPONINI 0.08* 0.11* 0.12*   BNP: Invalid input(s): POCBNP CBG: No results for input(s): GLUCAP in the last 168 hours. D-Dimer No results for input(s): DDIMER in the last 72 hours. Hgb A1c No results for input(s): HGBA1C in the last 72 hours. Lipid Profile  No results for input(s): CHOL, HDL, LDLCALC, TRIG, CHOLHDL, LDLDIRECT in the last 72 hours. Thyroid function studies No results for input(s): TSH, T4TOTAL, T3FREE, THYROIDAB in the last 72 hours.  Invalid input(s): FREET3 Anemia work up No results for input(s): VITAMINB12, FOLATE, FERRITIN, TIBC, IRON, RETICCTPCT in the last 72 hours. Urinalysis No results found for: COLORURINE, APPEARANCEUR, LABSPEC, [place], GLUCOSEU, HGBUR, BILIRUBINUR, KETONESUR, PROTEINUR, UROBILINOGEN, NITRITE, LEUKOCYTESUR Sepsis Labs Invalid input(s): PROCALCITONIN,  WBC,  LACTICIDVEN Microbiology No results found for this or any previous visit (from the past 240 hour(s)).   Time coordinating discharge: 35 minutes.   SIGNED:   Elmarie Shiley, MD  [facility] 10/20/2017, 11:04 AM Pager   If 7PM-7AM, please contact night-coverage [email or web address] Password [email or web address]

## 2017-10-20 NOTE — Progress Notes (Signed)
Discharge paper was completed and teaching was given waiting on ride. Blood pressure was reevaluated while waiting and was found to be low. Medications are being adjusted and observation overnight.

## 2017-10-20 NOTE — Progress Notes (Signed)
PROGRESS NOTE    David Castaneda  EZM:[id number] DOB: [birth year] DOA: 10/13/2017 PCP: Patient, No Pcp Per    Brief Narrative: 67 year old male with history of kidney stone, obesity, former smoker came to the hospital with chest pressure. Patient has not seen a physician past 30 years. Has been having intermittent chest pain for about a week. Also found to have elevated troponin 0.08, 0.11, 0.12, chest x-ray showed mild pulmonary edema and cardiomegaly.D-dimer was elevated to 1.09, CTA chest obtained which was negative for PE. BNP was elevated to 455.7  Patient was found to have new diagnosis of heart failure EF 15-20 %.    Assessment & Plan:   Principal Problem:   Chest pain Active Problems:   SOB (shortness of breath)   Acute CHF (congestive heart failure) (HCC)   Acute on chronic combined systolic and diastolic CHF (congestive heart failure) (HCC)   1-Acute systolic Heart failure;  HF team following.  Weight down to 260 from 280.  For Cardia MRI to rule infiltrative diseases.  He will need out patient sleep study.  Medications change today. entresto stop due to soft BP yesterday.  He was started on carvedilol and cozaar today. Now hypotensive, asymptomatic.  Plan to hold BP medications and observed overnight    2-Cirrhosis ? On CT ; Will check viral hepatitis panel  MRI liver confirm cirrhosis, . There is dilatation of the bile ducts to the lateral segment of left lobe of liver. Which may reflect mass effect upon the central bile ducts from centrally located liver cyst. No underlying enhancing liver lesion identified. Suggest follow-up imaging in 6 months with contrast enhanced MR I/MRCP to ensure stability of this focal abnormality and to rule out underlying malignant neoplasm obstructing the bile ducts to the left lobe. He will need to follow up with GI, might need biopsy.   3-Chest pain;  Cath; no significant CAD.       DVT prophylaxis: Heparin.  Code Status: full  code.  Family Communication: care discussed with patient  Disposition Plan: discharge when clear by cardiology   Consultants:   Cardiology    Procedures:  ECHO;  - Left ventricle: The cavity size was mildly dilated. Wall   thickness was increased in a pattern of mild LVH. Systolic   function was severely reduced. The estimated ejection fraction   was in the range of 15% to 20%. Diffuse hypokinesis. Definity   contrast shows slow flow and swirling at the LV apex without   formed mural thrombus. Indeterminate diastolic function -   possible grade 2 dysfunction. - Aorta: Aortic root dimension: 47 mm (ED). - Aortic root: The aortic root was moderately dilated. - Mitral valve: There was trivial regurgitation. - Left atrium: The atrium was mildly dilated. - Right atrium: Central venous pressure (est): 3 mm Hg. - Atrial septum: No defect or patent foramen ovale was identified. - Tricuspid valve: There was trivial regurgitation. - Pulmonary arteries: Systolic pressure could not be accurately   estimated. - Pericardium, extracardiac: There was no pericardial effusion.    Possible hepatic cyst visualized.     Antimicrobials:  none   Subjective: No new complaints, denies dyspnea.   Objective: Vitals:   10/20/17 1000 10/20/17 1142 10/20/17 1352 10/20/17 1356  BP: 112/80 (!) 78/62 (!) 66/52 (!) 76/49  Pulse: 80 82 77 76  Resp: 18 18    Temp: 98.2 F (36.8 C) 98.2 F (36.8 C)    TempSrc: Oral Oral    SpO2: 94% 95%  93% 92%  Weight:      Height:        Intake/Output Summary (Last 24 hours) at 10/20/2017 1418 Last data filed at 10/20/2017 1359 Gross per 24 hour  Intake 940 ml  Output 680 ml  Net 260 ml   Filed Weights   10/18/17 0443 10/19/17 0556 10/20/17 0618  Weight: 117.9 kg (260 lb) 116.8 kg (257 lb 6.4 oz) 117.6 kg (259 lb 3.2 oz)    Examination:  General exam: NAD Respiratory system: CTA Cardiovascular system: S 1, S 2 RRR Gastrointestinal system: BS  present, soft, nt. Central nervous system: non focal.  Extremities: symmetric power.  Skin: No rashes, lesions or ulcers   Data Reviewed: I have personally reviewed following labs and imaging studies  CBC: Recent Labs  Lab 10/13/17 2300 10/15/17 0629 10/16/17 0603  WBC 6.7 8.3 7.8  HGB 14.2 13.4 13.4  HCT 44.7 41.4 40.6  MCV 95.9 94.7 92.1  PLT 211 228 161   Basic Metabolic Panel: Recent Labs  Lab 10/14/17 1130 10/15/17 0629 10/16/17 0603 10/17/17 0245 10/18/17 0453 10/19/17 0433 10/20/17 0558  NA 139 137 141 140 140 140 138  K 3.7 4.1 3.5 4.3 3.9 3.8 4.0  CL 101 102 103 105 105 101 100  CO2 25 25 27 25 24 28 25   GLUCOSE 107* 98 77 83 90 99 81  BUN 12 14 11 9 12 15 18   CREATININE 0.98 0.83 0.84 0.85 0.94 1.02 0.90  CALCIUM 9.2 8.9 9.2 9.2 9.2 9.4 9.2  MG 1.7 1.8  --  2.1  --   --   --    GFR: Estimated Creatinine Clearance: 96.5 mL/min (by C-G formula based on SCr of 0.9 mg/dL). Liver Function Tests: Recent Labs  Lab 10/16/17 1000  AST 81*  ALT 30   No results for input(s): LIPASE, AMYLASE in the last 168 hours. No results for input(s): AMMONIA in the last 168 hours. Coagulation Profile: Recent Labs  Lab 10/16/17 0603  INR 1.26   Cardiac Enzymes: Recent Labs  Lab 10/14/17 0112 10/14/17 0637 10/14/17 1130  TROPONINI 0.08* 0.11* 0.12*   BNP (last 3 results) No results for input(s): PROBNP in the last 8760 hours. HbA1C: No results for input(s): HGBA1C in the last 72 hours. CBG: No results for input(s): GLUCAP in the last 168 hours. Lipid Profile: No results for input(s): CHOL, HDL, LDLCALC, TRIG, CHOLHDL, LDLDIRECT in the last 72 hours. Thyroid Function Tests: No results for input(s): TSH, T4TOTAL, FREET4, T3FREE, THYROIDAB in the last 72 hours. Anemia Panel: No results for input(s): VITAMINB12, FOLATE, FERRITIN, TIBC, IRON, RETICCTPCT in the last 72 hours. Sepsis Labs: No results for input(s): PROCALCITON, LATICACIDVEN in the last 168  hours.  No results found for this or any previous visit (from the past 240 hour(s)).       Radiology Studies: Mr Liver W Wo Contrast  Result Date: 10/20/2017 CLINICAL DATA:  Evaluate liver lesions identified on recent chest CT. EXAM: MRI ABDOMEN WITHOUT AND WITH CONTRAST TECHNIQUE: Multiplanar multisequence MR imaging of the abdomen was performed both before and after the administration of intravenous contrast. CONTRAST:  42mL MULTIHANCE GADOBENATE DIMEGLUMINE 529 MG/ML IV SOLN COMPARISON:  CT chest 10/14/2017. FINDINGS: Lower chest: No acute abnormality identified. Hepatobiliary: There is a nodular contour of the liver. There is hypertrophy of the caudate lobe of liver. Multiple cystic lesions are identified involving both lobes of liver. The largest is in the posterior right lobe measuring 7.7 cm, image 15/6.  Within the left lobe there is a centrally located cyst measuring 5 cm, image 19/5. This appears to have mass effect upon the proximal left lobe of liver bile ducts resulting and mild intrahepatic biliary dilatation to the lateral segment. Following the IV administration of contrast material no suspicious enhancing liver lesions identified. There is a normal caliber of the common bile duct. Stone within the gallbladder measures 1.2 cm, image 25/2. No gallbladder wall thickening or pericholecystic fluid. Pancreas: No mass, inflammatory changes, or other parenchymal abnormality identified. Spleen:  Within normal limits in size and appearance. Adrenals/Urinary Tract: The adrenal glands appear normal. Bilateral kidney cysts are identified. No kidney mass or hydronephrosis identified. Stomach/Bowel: Visualized portions within the abdomen are unremarkable. Vascular/Lymphatic: Normal appearance of the abdominal aorta. Portal vein and hepatic veins appear patent. Distal esophageal and gastric varices are identified. Other:  No ascites or focal fluid collections identified. Musculoskeletal: Normal signal  from within the bone marrow. IMPRESSION: 1. Morphologic features a liver compatible with cirrhosis. Stigmata of portal venous hypertension noted including esophageal and gastric varices. 2. Multiple liver cysts. 3. Gallstone 4. There is dilatation of the bile ducts to the lateral segment of left lobe of liver. Which may reflect mass effect upon the central bile ducts from centrally located liver cyst. No underlying enhancing liver lesion identified. Suggest follow-up imaging in 6 months with contrast enhanced MR I/MRCP to ensure stability of this focal abnormality and to rule out underlying malignant neoplasm obstructing the bile ducts to the left lobe. Electronically Signed   By: Kerby Moors M.D.   On: 10/20/2017 11:28        Scheduled Meds: . famotidine  20 mg Oral QHS  . furosemide  40 mg Oral Daily  . heparin  5,000 Units Subcutaneous Q8H  . mouth rinse  15 mL Mouth Rinse BID  . sodium chloride flush  3 mL Intravenous Q12H   Continuous Infusions: . sodium chloride       LOS: 5 days    Time spent: 35 minutes.     Elmarie Shiley, MD [facility] Pager [phone]  If 7PM-7AM, please contact night-coverage [email or web address] Password [email or web address] 10/20/2017, 2:18 PM

## 2017-10-20 NOTE — Progress Notes (Signed)
Patient alert and oriented waking steasdy with no complaints of dizzniess or light-headedness b/p dropped after given Coreg, entresto stopped, MD aware, discharged cancelled

## 2017-10-21 LAB — BASIC METABOLIC PANEL
ANION GAP: 7 (ref 5–15)
BUN: 19 mg/dL (ref 8–23)
CALCIUM: 9.3 mg/dL (ref 8.9–10.3)
CO2: 30 mmol/L (ref 22–32)
Chloride: 101 mmol/L (ref 98–111)
Creatinine, Ser: 0.95 mg/dL (ref 0.61–1.24)
Glucose, Bld: 85 mg/dL (ref 70–99)
Potassium: 4.3 mmol/L (ref 3.5–5.1)
Sodium: 138 mmol/L (ref 135–145)

## 2017-10-21 MED ORDER — SPIRONOLACTONE 12.5 MG HALF TABLET
12.5000 mg | ORAL_TABLET | Freq: Every day | ORAL | Status: DC
  Administered 2017-10-21: 12.5 mg via ORAL
  Filled 2017-10-21: qty 1

## 2017-10-21 MED ORDER — FUROSEMIDE 40 MG PO TABS: 40.0000 mg | ORAL_TABLET | Freq: Every day | ORAL | 0 refills | Status: DC

## 2017-10-21 MED ORDER — SPIRONOLACTONE 25 MG PO TABS: 12.5000 mg | ORAL_TABLET | Freq: Every day | ORAL | 0 refills | Status: DC

## 2017-10-21 MED ORDER — LOSARTAN POTASSIUM 25 MG PO TABS: 12.5000 mg | ORAL_TABLET | Freq: Every day | ORAL | Status: DC

## 2017-10-21 MED ORDER — LOSARTAN POTASSIUM 25 MG PO TABS: 12.5000 mg | ORAL_TABLET | Freq: Every day | ORAL | 0 refills | Status: DC

## 2017-10-21 NOTE — Discharge Summary (Signed)
Physician Discharge Summary  Juwan Vences ZWC:[id number] DOB: 1951-03-03 DOA: 10/13/2017  PCP: Patient, No Pcp Per  Admit date: 10/13/2017 Discharge date: 10/21/2017  Admitted From: Home  Disposition:  Home   Recommendations for Outpatient Follow-up:  1. Follow up with PCP in 1-2 weeks 2. Please obtain BMP/CBC in one week 3. Needs referral to Gastroenterologist for further evaluation of cirrhosis. Need MRI liver in 6 months.  4. Follow up with heart failure clinic.     Discharge Condition: Stable.  CODE STATUS: full code.  Diet recommendation: Heart Healthy   Brief/Interim Summary: 67 year old male with history of kidney stone, obesity, former smoker came to the hospital with chest pressure. Patient has not seen a physician past 30 years. Has been having intermittent chest pain for about a week. Also found to have elevated troponin 0.08, 0.11, 0.12, chest x-ray showed mild pulmonary edema and cardiomegaly.D-dimer was elevated to 1.09, CTA chest obtained which was negative for PE. BNP was elevated to 455.7  Patient was found to have new diagnosis of heart failure EF 15-20 %.    Assessment & Plan:   Principal Problem:   Chest pain Active Problems:   SOB (shortness of breath)   Acute CHF (congestive heart failure) (HCC)   Acute on chronic combined systolic and diastolic CHF (congestive heart failure) (HCC)   1-Acute systolic Heart failure;  HF team following.  Weight down to 260 from 280.  For Cardia MRI to rule infiltrative diseases.  He will need out patient sleep study.  Didn't tolerate entresto due to hypotension.  He was started on carvedilol and cozaar 7-19 and develops  Hypotension.  asymptomatic.  He was observed overnight. BP stable.  He will be discharge on lasix, low dose cozaar and low dose spironolactone   2-Cirrhosis  MRI liver confirm cirrhosis, . There is dilatation of the bile ducts to the lateral segment of left lobe of liver. Which may  reflect mass effect upon the central bile ducts from centrally located liver cyst. No underlying enhancing liver lesion identified. Suggest follow-up imaging in 6 months with contrast enhanced MR I/MRCP to ensure stability of this focal abnormality and to rule out underlying malignant neoplasm obstructing the bile ducts to the left lobe. He will need to follow up with GI, might need biopsy.  Hepatitis viral panel negative.    3-Chest pain;  Cath; no significant CAD.      Discharge Diagnoses:  Principal Problem:   Chest pain Active Problems:   SOB (shortness of breath)   Acute CHF (congestive heart failure) (HCC)   Acute on chronic combined systolic and diastolic CHF (congestive heart failure) [facility])    Discharge Instructions  Discharge Instructions    (HEART FAILURE PATIENTS) Call MD:  Anytime you have any of the following symptoms: 1) 3 pound weight gain in 24 hours or 5 pounds in 1 week 2) shortness of breath, with or without a dry hacking cough 3) swelling in the hands, feet or stomach 4) if you have to sleep on extra pillows at night in order to breathe.   Complete by:  As directed    (HEART FAILURE PATIENTS) Call MD:  Anytime you have any of the following symptoms: 1) 3 pound weight gain in 24 hours or 5 pounds in 1 week 2) shortness of breath, with or without a dry hacking cough 3) swelling in the hands, feet or stomach 4) if you have to sleep on extra pillows at night in order to breathe.  Complete by:  As directed    Diet - low sodium heart healthy   Complete by:  As directed    Diet - low sodium heart healthy   Complete by:  As directed    Heart Failure patients record your daily weight using the same scale at the same time of day   Complete by:  As directed    Heart Failure patients record your daily weight using the same scale at the same time of day   Complete by:  As directed    Increase activity slowly   Complete by:  As directed    Increase activity slowly    Complete by:  As directed    STOP any activity that causes chest pain, shortness of breath, dizziness, sweating, or exessive weakness   Complete by:  As directed    STOP any activity that causes chest pain, shortness of breath, dizziness, sweating, or exessive weakness   Complete by:  As directed      Allergies as of 10/21/2017   No Known Allergies     Medication List    STOP taking these medications   acetaminophen 500 MG tablet Commonly known as:  TYLENOL   BRONKAID 25-400 MG Tabs Generic drug:  ePHEDrine-guaiFENesin     TAKE these medications   aspirin 81 MG chewable tablet Chew 81 mg by mouth as needed for mild pain.   famotidine 20 MG tablet Commonly known as:  PEPCID Take 1 tablet (20 mg total) by mouth at bedtime.   furosemide 40 MG tablet Commonly known as:  LASIX Take 1 tablet (40 mg total) by mouth daily.   losartan 25 MG tablet Commonly known as:  COZAAR Take 0.5 tablets (12.5 mg total) by mouth at bedtime.   spironolactone 25 MG tablet Commonly known as:  ALDACTONE Take 0.5 tablets (12.5 mg total) by mouth daily.      Follow-up Information    [facility] Follow up on 10/31/2017.   Specialty:  Cardiology Why:  Heart Failure Followup at [facility]-2pm-Parking at ER lot (enter under blue "Specialty Clinics" awning) or under [place] ([place], [address], elevator 1st floor). Take all am meds, bring all med bottles. Contact information: [address] [id number] [place] [address]         No Known Allergies  Consultations:  Cardiology    Procedures/Studies: Dg Chest 2 View  Result Date: 10/13/2017 CLINICAL DATA:  66 year old male with chest pain. EXAM: CHEST - 2 VIEW COMPARISON:  None FINDINGS: There is mild cardiomegaly with mild vascular congestion and small bilateral pleural effusions. No focal consolidation, or pneumothorax.  No acute osseous pathology. IMPRESSION: Cardiomegaly with mild CHF and small bilateral pleural effusions. Clinical correlation is recommended. Electronically Signed   By: Anner Crete M.D.   On: 10/13/2017 23:29   Ct Angio Chest Pe W Or Wo Contrast  Result Date: 10/14/2017 CLINICAL DATA:  67 year old male with positive D-dimer. Concern for pulmonary embolism. EXAM: CT ANGIOGRAPHY CHEST WITH CONTRAST TECHNIQUE: Multidetector CT imaging of the chest was performed using the standard protocol during bolus administration of intravenous contrast. Multiplanar CT image reconstructions and MIPs were obtained to evaluate the vascular anatomy. CONTRAST:  173mL ISOVUE-370 IOPAMIDOL (ISOVUE-370) INJECTION 76% COMPARISON:  Chest radiograph dated 10/13/2017 FINDINGS: Cardiovascular: There is mild cardiomegaly. No pericardial effusion. The thoracic aorta is unremarkable. There is no CT evidence of pulmonary embolism. Mediastinum/Nodes: No hilar or mediastinal adenopathy.  Esophagus and the thyroid gland are grossly unremarkable. No mediastinal fluid collection. Lungs/Pleura: Bilateral small to moderate pleural effusions with associated partial compressive atelectasis of the lower lobes. Scattered nodular and hazy airspace densities in the left lower lobe may represent alveolar edema or pneumonia. Clinical correlation is recommended. There is diffuse interlobular septal prominence and edema. There is no pneumothorax. The central airways are patent. Upper Abdomen: Irregular hepatic contour with morphologic changes of cirrhosis. Multiple hypodense hepatic lesions are not characterized on this CT. The largest lesion in the right lobe of the liver measures 8 cm. These lesions demonstrate fluid attenuation, possibly cysts. However, there is apparent central enhancement or calcification of lesion in the right lobe of the liver (series 7, image 329). Further characterization of the liver lesions with MRI is recommended.  Musculoskeletal: Mild degenerative changes of the spine. No acute osseous pathology. Review of the MIP images confirms the above findings. IMPRESSION: 1. No CT evidence of pulmonary embolism. 2. Mild cardiomegaly with findings of CHF cyst and small to moderate bilateral pleural effusions. 3. Nodular and hazy airspace densities in the left lower lobe may represent alveolar edema or pneumonia. Clinical correlation is recommended. 4. Cirrhosis. Multiple hepatic hypodense lesions are not well characterized. Further evaluation with MRI recommended. Electronically Signed   By: Anner Crete M.D.   On: 10/14/2017 04:58   Mr Liver W Wo Contrast  Result Date: 10/20/2017 CLINICAL DATA:  Evaluate liver lesions identified on recent chest CT. EXAM: MRI ABDOMEN WITHOUT AND WITH CONTRAST TECHNIQUE: Multiplanar multisequence MR imaging of the abdomen was performed both before and after the administration of intravenous contrast. CONTRAST:  54mL MULTIHANCE GADOBENATE DIMEGLUMINE 529 MG/ML IV SOLN COMPARISON:  CT chest 10/14/2017. FINDINGS: Lower chest: No acute abnormality identified. Hepatobiliary: There is a nodular contour of the liver. There is hypertrophy of the caudate lobe of liver. Multiple cystic lesions are identified involving both lobes of liver. The largest is in the posterior right lobe measuring 7.7 cm, image 15/6. Within the left lobe there is a centrally located cyst measuring 5 cm, image 19/5. This appears to have mass effect upon the proximal left lobe of liver bile ducts resulting and mild intrahepatic biliary dilatation to the lateral segment. Following the IV administration of contrast material no suspicious enhancing liver lesions identified. There is a normal caliber of the common bile duct. Stone within the gallbladder measures 1.2 cm, image 25/2. No gallbladder wall thickening or pericholecystic fluid. Pancreas: No mass, inflammatory changes, or other parenchymal abnormality identified. Spleen:   Within normal limits in size and appearance. Adrenals/Urinary Tract: The adrenal glands appear normal. Bilateral kidney cysts are identified. No kidney mass or hydronephrosis identified. Stomach/Bowel: Visualized portions within the abdomen are unremarkable. Vascular/Lymphatic: Normal appearance of the abdominal aorta. Portal vein and hepatic veins appear patent. Distal esophageal and gastric varices are identified. Other:  No ascites or focal fluid collections identified. Musculoskeletal: Normal signal from within the bone marrow. IMPRESSION: 1. Morphologic features a liver compatible with cirrhosis. Stigmata of portal venous hypertension noted including esophageal and gastric varices. 2. Multiple liver cysts. 3. Gallstone 4. There is dilatation of the bile ducts to the lateral segment of left lobe of liver. Which may reflect mass effect upon the central bile ducts from centrally located liver cyst. No underlying enhancing liver lesion identified. Suggest follow-up imaging in 6 months with contrast enhanced MR I/MRCP to ensure stability of this focal abnormality and to rule out underlying malignant neoplasm obstructing the bile ducts to the  left lobe. Electronically Signed   By: Kerby Moors M.D.   On: 10/20/2017 11:28   Mr Cardiac Morphology W Wo Contrast  Result Date: 10/20/2017 CLINICAL DATA:  Cardiomyopathy of uncertain etiology. EXAM: CARDIAC MRI TECHNIQUE: The patient was scanned on a 1.5 Tesla GE magnet. A dedicated cardiac coil was used. Functional imaging was done using Fiesta sequences. 2,3, and 4 chamber views were done to assess for RWMA's. Modified Simpson's rule using a short axis stack was used to calculate an ejection fraction on a dedicated work Conservation officer, nature. The patient received 30 cc of Multihance. After 10 minutes inversion recovery sequences were used to assess for infiltration and scar tissue. FINDINGS: Limited images of the lung fields showed no gross abnormalities. The  aortic root was dilated to 4.5 cm. Mildly dilated left ventricle with normal wall thickness. EF 18% with diffuse hypokinesis. Normal right ventricular size with mild to moderate systolic dysfunction. Normal right atrial size. Normal left atrial size. Trileaflet aortic valve with no significant regurgitation or stenosis. No significant mitral regurgitation noted. On delayed enhancement imaging, there was no myocardial late gadolinium enhancement (LGE). Measurements: LVEDV 239 mL LVSV 44 mL LVEF 18% IMPRESSION: 1.  Mildly dilated LV with EF 18%, diffuse hypokinesis. 2.  Normal RV size with mild to moderate systolic dysfunction. 3.  Dilated aortic root to 4.4 cm. 4. No myocardial LGE, so no definitive evidence for prior MI, infiltrative disease, or myocarditis. Dalton Mclean Electronically Signed   By: Loralie Champagne M.D.   On: 10/20/2017 18:38      Subjective: Feeling well, willing to go home  Discharge Exam: Vitals:   10/21/17 0458 10/21/17 1006  BP: (!) 105/50 (!) 91/48  Pulse: 78 79  Resp: 17   Temp: 97.6 F (36.4 C) 98 F (36.7 C)  SpO2: 98% 99%   Vitals:   10/20/17 2025 10/21/17 0452 10/21/17 0458 10/21/17 1006  BP: (!) 87/61  (!) 105/50 (!) 91/48  Pulse: 70  78 79  Resp: 17  17   Temp: 98.1 F (36.7 C)  97.6 F (36.4 C) 98 F (36.7 C)  TempSrc: Oral  Oral Oral  SpO2: 97%  98% 99%  Weight:  118.3 kg (260 lb 11.2 oz)    Height:        General: Pt is alert, awake, not in acute distress Cardiovascular: RRR, S1/S2 +, no rubs, no gallops Respiratory: CTA bilaterally, no wheezing, no rhonchi Abdominal: Soft, NT, ND, bowel sounds + Extremities: no edema, no cyanosis    The results of significant diagnostics from this hospitalization (including imaging, microbiology, ancillary and laboratory) are listed below for reference.     Microbiology: No results found for this or any previous visit (from the past 240 hour(s)).   Labs: BNP (last 3 results) Recent Labs     10/14/17 0113  BNP 811.9*   Basic Metabolic Panel: Recent Labs  Lab 10/14/17 1130 10/15/17 0629  10/17/17 0245 10/18/17 0453 10/19/17 0433 10/20/17 0558 10/21/17 0605  NA 139 137   < > 140 140 140 138 138  K 3.7 4.1   < > 4.3 3.9 3.8 4.0 4.3  CL 101 102   < > 105 105 101 100 101  CO2 25 25   < > 25 24 28 25 30   GLUCOSE 107* 98   < > 83 90 99 81 85  BUN 12 14   < > 9 12 15 18 19   CREATININE 0.98 0.83   < >  0.85 0.94 1.02 0.90 0.95  CALCIUM 9.2 8.9   < > 9.2 9.2 9.4 9.2 9.3  MG 1.7 1.8  --  2.1  --   --   --   --    < > = values in this interval not displayed.   Liver Function Tests: Recent Labs  Lab 10/16/17 1000  AST 81*  ALT 30   No results for input(s): LIPASE, AMYLASE in the last 168 hours. No results for input(s): AMMONIA in the last 168 hours. CBC: Recent Labs  Lab 10/15/17 0629 10/16/17 0603  WBC 8.3 7.8  HGB 13.4 13.4  HCT 41.4 40.6  MCV 94.7 92.1  PLT 228 220   Cardiac Enzymes: Recent Labs  Lab 10/14/17 1130  TROPONINI 0.12*   BNP: Invalid input(s): POCBNP CBG: No results for input(s): GLUCAP in the last 168 hours. D-Dimer No results for input(s): DDIMER in the last 72 hours. Hgb A1c No results for input(s): HGBA1C in the last 72 hours. Lipid Profile No results for input(s): CHOL, HDL, LDLCALC, TRIG, CHOLHDL, LDLDIRECT in the last 72 hours. Thyroid function studies No results for input(s): TSH, T4TOTAL, T3FREE, THYROIDAB in the last 72 hours.  Invalid input(s): FREET3 Anemia work up No results for input(s): VITAMINB12, FOLATE, FERRITIN, TIBC, IRON, RETICCTPCT in the last 72 hours. Urinalysis No results found for: COLORURINE, APPEARANCEUR, LABSPEC, [place], GLUCOSEU, HGBUR, BILIRUBINUR, KETONESUR, PROTEINUR, UROBILINOGEN, NITRITE, LEUKOCYTESUR Sepsis Labs Invalid input(s): PROCALCITONIN,  WBC,  LACTICIDVEN Microbiology No results found for this or any previous visit (from the past 240 hour(s)).   Time coordinating discharge: 35  minutes.   SIGNED:   Elmarie Shiley, MD  [facility] 10/21/2017, 10:53 AM Pager   If 7PM-7AM, please contact night-coverage [email or web address] Password [email or web address]

## 2017-10-21 NOTE — Progress Notes (Signed)
Patient ID: David Castaneda, male   DOB: Apr 14, 1950, 67 y.o.   MRN: [id number]     Advanced Heart Failure Rounding Note  PCP-Cardiologist: No primary care provider on file.   Subjective:    Feels good today, no dyspnea.   SBP 90s-100s.   cMRI (10/20/17): EF 18%, diffuse hypokinesis, mild-moderately decreased RV systolic function, ascending aorta 4.4 cm, no myocardial LGE.   R/LHC 10/16/17: Left Main  No coronary disease noted.  Left Anterior Descending  No coronary disease noted.  Ramus Intermedius  Moderate ramus. No coronary disease noted.  Left Circumflex  No coronary disease noted.  Right Coronary Artery  No coronary disease noted.   Right Heart Pressures RHC Procedural Findings: Hemodynamics (mmHg) RA mean 13 RV 48/18 PA 44/19, mean 31 PCWP mean 31  Oxygen saturations: PA 76% AO 97%  Cardiac Output (Fick) 8  Cardiac Index (Fick) 3.48   Echo 10/15/17: mild LVH, EF 15-20%, diffuse HK, ?grade 2 DD, aortic root moderately dilated, trivial MR, LA mildly dilated,trivial TR, no pericardial effusion  Objective:   Weight Range: 260 lb 11.2 oz (118.3 kg) Body mass index is 39.64 kg/m.   Vital Signs:   Temp:  [97.6 F (36.4 C)-98.9 F (37.2 C)] 98 F (36.7 C) (07/20 1006) Pulse Rate:  [70-82] 79 (07/20 1006) Resp:  [16-49] 17 (07/20 0458) BP: (66-105)/(48-62) 91/48 (07/20 1006) SpO2:  [92 %-99 %] 99 % (07/20 1006) Weight:  [260 lb 11.2 oz (118.3 kg)] 260 lb 11.2 oz (118.3 kg) (07/20 0452) Last BM Date: 10/20/17  Weight change: Filed Weights   10/19/17 0556 10/20/17 0618 10/21/17 0452  Weight: 257 lb 6.4 oz (116.8 kg) 259 lb 3.2 oz (117.6 kg) 260 lb 11.2 oz (118.3 kg)    Intake/Output:   Intake/Output Summary (Last 24 hours) at 10/21/2017 1025 Last data filed at 10/20/2017 1757 Gross per 24 hour  Intake 480 ml  Output -  Net 480 ml      Physical Exam    General: NAD Neck: No JVD, no thyromegaly or thyroid nodule.  Lungs: Clear to auscultation bilaterally  with normal respiratory effort. CV: Nonpalpable PMI.  Heart regular S1/S2, no S3/S4, no murmur.  No peripheral edema.   Abdomen: Soft, nontender, no hepatosplenomegaly, no distention.  Skin: Intact without lesions or rashes.  Neurologic: Alert and oriented x 3.  Psych: Normal affect. Extremities: No clubbing or cyanosis.  HEENT: Normal.    Telemetry   NSR 80s, personally reviewed.   EKG    No new tracings.    Labs    CBC No results for input(s): WBC, NEUTROABS, HGB, HCT, MCV, PLT in the last 72 hours. Basic Metabolic Panel Recent Labs    10/20/17 0558 10/21/17 0605  NA 138 138  K 4.0 4.3  CL 100 101  CO2 25 30  GLUCOSE 81 85  BUN 18 19  CREATININE 0.90 0.95  CALCIUM 9.2 9.3   Liver Function Tests No results for input(s): AST, ALT, ALKPHOS, BILITOT, PROT, ALBUMIN in the last 72 hours. No results for input(s): LIPASE, AMYLASE in the last 72 hours. Cardiac Enzymes No results for input(s): CKTOTAL, CKMB, CKMBINDEX, TROPONINI in the last 72 hours.  BNP: BNP (last 3 results) Recent Labs    10/14/17 0113  BNP 455.7*    ProBNP (last 3 results) No results for input(s): PROBNP in the last 8760 hours.   D-Dimer No results for input(s): DDIMER in the last 72 hours. Hemoglobin A1C No results for input(s): HGBA1C in  the last 72 hours. Fasting Lipid Panel No results for input(s): CHOL, HDL, LDLCALC, TRIG, CHOLHDL, LDLDIRECT in the last 72 hours. Thyroid Function Tests No results for input(s): TSH, T4TOTAL, T3FREE, THYROIDAB in the last 72 hours.  Invalid input(s): FREET3  Other results:   Imaging    Mr Cardiac Morphology W Wo Contrast  Result Date: 10/20/2017 CLINICAL DATA:  Cardiomyopathy of uncertain etiology. EXAM: CARDIAC MRI TECHNIQUE: The patient was scanned on a 1.5 Tesla GE magnet. A dedicated cardiac coil was used. Functional imaging was done using Fiesta sequences. 2,3, and 4 chamber views were done to assess for RWMA's. Modified Simpson's rule  using a short axis stack was used to calculate an ejection fraction on a dedicated work Conservation officer, nature. The patient received 30 cc of Multihance. After 10 minutes inversion recovery sequences were used to assess for infiltration and scar tissue. FINDINGS: Limited images of the lung fields showed no gross abnormalities. The aortic root was dilated to 4.5 cm. Mildly dilated left ventricle with normal wall thickness. EF 18% with diffuse hypokinesis. Normal right ventricular size with mild to moderate systolic dysfunction. Normal right atrial size. Normal left atrial size. Trileaflet aortic valve with no significant regurgitation or stenosis. No significant mitral regurgitation noted. On delayed enhancement imaging, there was no myocardial late gadolinium enhancement (LGE). Measurements: LVEDV 239 mL LVSV 44 mL LVEF 18% IMPRESSION: 1.  Mildly dilated LV with EF 18%, diffuse hypokinesis. 2.  Normal RV size with mild to moderate systolic dysfunction. 3.  Dilated aortic root to 4.4 cm. 4. No myocardial LGE, so no definitive evidence for prior MI, infiltrative disease, or myocarditis. David Castaneda Electronically Signed   By: Loralie Champagne M.D.   On: 10/20/2017 18:38     Medications:     Scheduled Medications: . famotidine  20 mg Oral QHS  . furosemide  40 mg Oral Daily  . heparin  5,000 Units Subcutaneous Q8H  . losartan  12.5 mg Oral QHS  . mouth rinse  15 mL Mouth Rinse BID  . sodium chloride flush  3 mL Intravenous Q12H  . spironolactone  12.5 mg Oral Daily    Infusions: . sodium chloride      PRN Medications: sodium chloride, acetaminophen, calcium carbonate, dextromethorphan-guaiFENesin, hydrALAZINE, iopamidol, ipratropium, levalbuterol, morphine injection, nitroGLYCERIN, ondansetron (ZOFRAN) IV, sodium chloride flush, zolpidem    Patient Profile   David Castaneda is a 67 y.o. male with a history of obesity and tobacco use. He last saw a doctor 30 years ago. He does not take  any medications at home.  Admitted with CP and SOB and found to have EF 15-20%.    Assessment/Plan   1. Acute systolic CHF: Echo with EF 15-20%. Nonischemic cardiomyopathy, no significant CAD on coronary angiography today. No recent viral URI symptoms. TSH normal.No heavy ETOH history. Possible viral myocarditis. Cardiac MRI with EF 18%, no LGE. RHC with preserved cardiac output and elevated filling pressures. Volume improved with IV Lasix.  Meds held yesterday with asymptomatic hypotension.  Creatinine stable.  - Lasix 40 mg daily.  - Restart spironolactone 12.5 mg qam and losartan 12.5 qhs.  Hold off on Coreg for now.   - Marginal BP with very low EF is concerning.  It may be that he needs advanced therapies (LVAD) down the road, but cirrhosis may be a barrier to this.  RHC this admission, however, showed preserved cardiac output.  Will need CPX as outpatient.  Will need close followup.  2. Cirrhosis: Noted  on CTA. Viral hepatitis labs negative.  No history of heavy ETOH.  Liver MRI showed cirrhosis and liver cysts.   3. Snoring: Will need outpatient sleep study. 4. Smoking: Needs to qui completely.    Think he can go home today.  Has followup in CHF clinic.  Meds for home: Lasix 40 mg daily, spironolactone 12.5 daily, losartan 12.5 qhs.   Loralie Champagne 10/21/2017 10:25 AM

## 2017-10-21 NOTE — Progress Notes (Signed)
Pt got discharged to home, discharge instructions provided and patient showed understanding to it, IV taken out,Telemonitor DC,pt left unit in wheelchair with all of the belongings accompanied with his significant other  Palma Holter, Therapist, sports

## 2017-10-31 ENCOUNTER — Ambulatory Visit (HOSPITAL_COMMUNITY)
Admission: RE | Admit: 2017-10-31 | Discharge: 2017-10-31 | Disposition: A | Discharge status: Home / Self Care | Payer: Medicare Other | Source: Ambulatory Visit | Attending: Cardiology | Admitting: Cardiology

## 2017-10-31 VITALS — BP 132/84 | HR 83 | Wt 263.4 lb

## 2017-10-31 DIAGNOSIS — I429 Cardiomyopathy, unspecified: Secondary | ICD-10-CM | POA: Diagnosis not present

## 2017-10-31 DIAGNOSIS — I5043 Acute on chronic combined systolic (congestive) and diastolic (congestive) heart failure: Secondary | ICD-10-CM

## 2017-10-31 DIAGNOSIS — Z7982 Long term (current) use of aspirin: Secondary | ICD-10-CM | POA: Diagnosis not present

## 2017-10-31 DIAGNOSIS — E669 Obesity, unspecified: Secondary | ICD-10-CM | POA: Diagnosis not present

## 2017-10-31 DIAGNOSIS — Z6841 Body Mass Index (BMI) 40.0 and over, adult: Secondary | ICD-10-CM | POA: Insufficient documentation

## 2017-10-31 DIAGNOSIS — I5022 Chronic systolic (congestive) heart failure: Secondary | ICD-10-CM | POA: Insufficient documentation

## 2017-10-31 DIAGNOSIS — K7460 Unspecified cirrhosis of liver: Secondary | ICD-10-CM | POA: Diagnosis not present

## 2017-10-31 DIAGNOSIS — Z87891 Personal history of nicotine dependence: Secondary | ICD-10-CM | POA: Diagnosis not present

## 2017-10-31 DIAGNOSIS — R0683 Snoring: Secondary | ICD-10-CM | POA: Diagnosis not present

## 2017-10-31 DIAGNOSIS — Z79899 Other long term (current) drug therapy: Secondary | ICD-10-CM | POA: Diagnosis not present

## 2017-10-31 DIAGNOSIS — F17200 Nicotine dependence, unspecified, uncomplicated: Secondary | ICD-10-CM | POA: Diagnosis not present

## 2017-10-31 LAB — BASIC METABOLIC PANEL WITH GFR
Anion gap: 9 (ref 5–15)
BUN: 14 mg/dL (ref 8–23)
CO2: 25 mmol/L (ref 22–32)
Calcium: 9.6 mg/dL (ref 8.9–10.3)
Chloride: 104 mmol/L (ref 98–111)
Creatinine, Ser: 0.91 mg/dL (ref 0.61–1.24)
GFR calc Af Amer: >60 mL/min
GFR calc non Af Amer: >60 mL/min
Glucose, Bld: 88 mg/dL (ref 70–99)
Potassium: 4.1 mmol/L (ref 3.5–5.1)
Sodium: 138 mmol/L (ref 135–145)

## 2017-10-31 LAB — BRAIN NATRIURETIC PEPTIDE: B Natriuretic Peptide: 43.3 pg/mL (ref 0.0–100.0)

## 2017-10-31 MED ORDER — CARVEDILOL 3.125 MG PO TABS: 3.1250 mg | ORAL_TABLET | Freq: Two times a day (BID) | ORAL | 11 refills | Status: DC

## 2017-10-31 NOTE — Patient Instructions (Signed)
START Coreg 3.125 mg, one tab twice a day  Your physician has recommended that you have a sleep study. This test records several body functions during sleep, including: brain activity, eye movement, oxygen and carbon dioxide blood levels, heart rate and rhythm, breathing rate and rhythm, the flow of air through your mouth and nose, snoring, body muscle movements, and chest and belly movement.  You have been referred to [facility]- Dr Radford Pax for further evaluation and sleep management  You have been provided a list of primary care physicians, please make an appointment to establish care.   Your physician recommends that you schedule a follow-up appointment in: 2-3 weeks in the APP clinic  Labs today We will only contact you if something comes back abnormal or we need to make some changes. Otherwise no news is good news!  Do the following things EVERYDAY: 1) Weigh yourself in the morning before breakfast. Write it down and keep it in a log. 2) Take your medicines as prescribed 3) Eat low salt foods-Limit salt (sodium) to 2000 mg per day.  4) Stay as active as you can everyday 5) Limit all fluids for the day to less than 2 liters

## 2017-10-31 NOTE — Progress Notes (Signed)
PCP: None  Primary Cardiologist: Dr Aundra Dubin   HPI: David Castaneda is a 67 year old with history of NICM, smoking, cirrhosis, and systolic heart failure.   Admitted with increase SOB/CP. HF team consulted. RHC/LHC as noted below with normal cors and adequate cardiac output. SBP was soft so no BB was added. Placed on low dose losartn and spiro. Discharge weight 260 pounds.   Today he returns for HF follow up with a friend. Overall feeling fine. SOB with inclines. Denies PND/Orthopnea. Snores. Uses an electric cart in the grocery store.  Appetite ok. No fever or chills. Weight at home 260 pounds. Taking all medications. Lives alone. Does not smoke.   cMRI (10/20/17): EF 18%, diffuse hypokinesis, mild-moderately decreased RV systolic function, ascending aorta 4.4 cm, no myocardial LGE.   R/LHC 10/16/17: Left Main  No coronary disease noted.  Left Anterior Descending  No coronary disease noted.  Ramus Intermedius  Moderate ramus. No coronary disease noted.  Left Circumflex  No coronary disease noted.  Right Coronary Artery  No coronary disease noted.   Right Heart Pressures RHC Procedural Findings: Hemodynamics (mmHg) RA mean 13 RV 48/18 PA 44/19, mean 31 PCWP mean 31  Oxygen saturations: PA 76% AO 97%  Cardiac Output (Fick) 8  Cardiac Index (Fick) 3.48   Echo 10/15/17: mild LVH, EF 15-20%, diffuse HK, ?grade 2 DD, aortic root moderately dilated, trivial David, LA mildly dilated,trivial TR, no pericardial effusion      ROS: All systems negative except as listed in HPI, PMH and Problem List.  SH:  Social History   Socioeconomic History  . Marital status: Married    Spouse name: Not on file  . Number of children: Not on file  . Years of education: Not on file  . Highest education level: Not on file  Occupational History  . Not on file  Social Needs  . Financial resource strain: Not on file  . Food insecurity:    Worry: Not on file    Inability: Not on file  .  Transportation needs:    Medical: Not on file    Non-medical: Not on file  Tobacco Use  . Smoking status: Former Research scientist (life sciences)  . Smokeless tobacco: Never Used  Substance and Sexual Activity  . Alcohol use: Not Currently  . Drug use: Never  . Sexual activity: Not on file  Lifestyle  . Physical activity:    Days per week: Not on file    Minutes per session: Not on file  . Stress: Not on file  Relationships  . Social connections:    Talks on phone: Not on file    Gets together: Not on file    Attends religious service: Not on file    Active member of club or organization: Not on file    Attends meetings of clubs or organizations: Not on file    Relationship status: Not on file  . Intimate partner violence:    Fear of current or ex partner: Not on file    Emotionally abused: Not on file    Physically abused: Not on file    Forced sexual activity: Not on file  Other Topics Concern  . Not on file  Social History Narrative  . Not on file    FH:  Family History  Problem Relation Age of Onset  . Dementia Mother   . Heart disease Father   . Lung cancer Brother        SCLC    Past  Medical History:  Diagnosis Date  . Former smoker   . Kidney stone   . Sucking chest wound     Current Outpatient Medications  Medication Sig Dispense Refill  . aspirin 81 MG chewable tablet Chew 81 mg by mouth as needed for mild pain.    . famotidine (PEPCID) 20 MG tablet Take 1 tablet (20 mg total) by mouth at bedtime. 30 tablet 0  . furosemide (LASIX) 40 MG tablet Take 1 tablet (40 mg total) by mouth daily. 30 tablet 0  . losartan (COZAAR) 25 MG tablet Take 0.5 tablets (12.5 mg total) by mouth at bedtime. 30 tablet 0  . spironolactone (ALDACTONE) 25 MG tablet Take 0.5 tablets (12.5 mg total) by mouth daily. 30 tablet 0   No current facility-administered medications for this encounter.     Vitals:   10/31/17 1407  BP: 132/84  Pulse: 83  SpO2: 95%  Weight: 263 lb 6.4 oz (119.5 kg)   Wt  Readings from Last 3 Encounters:  10/31/17 263 lb 6.4 oz (119.5 kg)  10/21/17 260 lb 11.2 oz (118.3 kg)    PHYSICAL EXAM: General:  Walked slowly in the clinic. No resp difficulty HEENT: normal Neck: supple. JVP flat. Carotids 2+ bilaterally; no bruits. No lymphadenopathy or thryomegaly appreciated. Cor: PMI normal. Regular rate & rhythm. No rubs, gallops or murmurs. Lungs: clear Abdomen: obese, soft, nontender, nondistended. No hepatosplenomegaly. No bruits or masses. Good bowel sounds. Extremities: no cyanosis, clubbing, rash, edema.  Neuro: alert & orientedx3, cranial nerves grossly intact. Moves all 4 extremities w/o difficulty. Affect pleasant.  ASSESSMENT & PLAN:  1. Chronic systolic CHF: Echo with EF 15-20%. Nonischemic cardiomyopathy, no significant CAD on coronary angiography today. No recent viral URI symptoms. TSH normal. Possible viral myocarditis. Cardiac MRI with EF 18%, no LGE. RHC with preserved cardiac output and elevated filling pressures.  Narrow  QRS NYHA II-III-Volume status stable.  - Continue lasix 40 mg daily.  -Continue spironolactone 12.5 mg daily and losartan 12.5 mg every night. Intolerant  entresto with hypotension.  - Add low dose carvedilol 3.125 mg twice a day.  Discussed potential side effects.  -Reinforced low salt diet, limiting fluids to < 2 liters per day, and daily weights.  2. Cirrhosis: Noted on CTA. Viral hepatitis labs negative.  No history of heavy ETOH.  Liver MRI showed cirrhosis and liver cysts.   3. Snoring: Will need outpatient sleep study.Referred to Dr Radford Pax for sleep study.  4. Smoking: He has not been smoking.  5. Obesity Body mass index is 40.05 kg/m.  Discussed portion control.    Check BMET Discussed the plan over the next 3 months.  Greater than 50% of the 25 minute visit was spent in counseling/coordination of care regarding disease state education, salt/fluid restriction, sliding scale diuretics, and medication  compliance.   Follow up 2-3 weeks for medication titration.   David Harris NP-C  4:24 PM

## 2017-11-07 ENCOUNTER — Other Ambulatory Visit (HOSPITAL_COMMUNITY): Payer: Self-pay | Admitting: *Deleted

## 2017-11-20 ENCOUNTER — Other Ambulatory Visit (HOSPITAL_COMMUNITY): Payer: Self-pay

## 2017-11-20 MED ORDER — FUROSEMIDE 40 MG PO TABS: 40.0000 mg | ORAL_TABLET | Freq: Every day | ORAL | 0 refills | Status: DC

## 2017-11-20 MED ORDER — SPIRONOLACTONE 25 MG PO TABS: 12.5000 mg | ORAL_TABLET | Freq: Every day | ORAL | 0 refills | Status: DC

## 2017-11-21 ENCOUNTER — Encounter (HOSPITAL_COMMUNITY): Payer: Medicare Other

## 2017-11-22 ENCOUNTER — Ambulatory Visit (HOSPITAL_COMMUNITY)
Admission: RE | Admit: 2017-11-22 | Discharge: 2017-11-22 | Disposition: A | Discharge status: Home / Self Care | Payer: Medicare Other | Source: Ambulatory Visit | Attending: Internal Medicine | Admitting: Internal Medicine

## 2017-11-22 VITALS — BP 110/76 | HR 67 | Wt 270.6 lb

## 2017-11-22 DIAGNOSIS — Z8249 Family history of ischemic heart disease and other diseases of the circulatory system: Secondary | ICD-10-CM | POA: Insufficient documentation

## 2017-11-22 DIAGNOSIS — Z79899 Other long term (current) drug therapy: Secondary | ICD-10-CM | POA: Insufficient documentation

## 2017-11-22 DIAGNOSIS — Z87891 Personal history of nicotine dependence: Secondary | ICD-10-CM | POA: Insufficient documentation

## 2017-11-22 DIAGNOSIS — Z7982 Long term (current) use of aspirin: Secondary | ICD-10-CM | POA: Insufficient documentation

## 2017-11-22 DIAGNOSIS — K7460 Unspecified cirrhosis of liver: Secondary | ICD-10-CM | POA: Insufficient documentation

## 2017-11-22 DIAGNOSIS — I5042 Chronic combined systolic (congestive) and diastolic (congestive) heart failure: Secondary | ICD-10-CM | POA: Diagnosis not present

## 2017-11-22 DIAGNOSIS — E669 Obesity, unspecified: Secondary | ICD-10-CM | POA: Insufficient documentation

## 2017-11-22 DIAGNOSIS — R0683 Snoring: Secondary | ICD-10-CM

## 2017-11-22 DIAGNOSIS — Z6841 Body Mass Index (BMI) 40.0 and over, adult: Secondary | ICD-10-CM | POA: Diagnosis not present

## 2017-11-22 DIAGNOSIS — I5022 Chronic systolic (congestive) heart failure: Secondary | ICD-10-CM | POA: Insufficient documentation

## 2017-11-22 NOTE — Progress Notes (Signed)
PCP: None  Primary Cardiologist: Dr Aundra Dubin   HPI: [name] is a 67 year old with history of NICM, smoking, cirrhosis, and systolic heart failure.   Admitted 10/13/2017 with increase SOB/CP. HF team consulted. RHC/LHC as noted below with normal cors and adequate cardiac output. SBP was soft so no BB was added. Placed on low dose losartn and spiro. Discharge weight 260 pounds.   Today he returns for HF follow up. Last visit he was started on low dose carvedilol. Overall feeling fine. Has daytime fatigue. Denies PND/Orthopnea. Mild dyspnea with inclines. Appetite ok. Tries to eat salads. No fever or chills. Weight at home has been  256-260 pounds. Taking all medications.   cMRI (10/20/17): EF 18%, diffuse hypokinesis, mild-moderately decreased RV systolic function, ascending aorta 4.4 cm, no myocardial LGE.   R/LHC 10/16/17: Left Main  No coronary disease noted.  Left Anterior Descending  No coronary disease noted.  Ramus Intermedius  Moderate ramus. No coronary disease noted.  Left Circumflex  No coronary disease noted.  Right Coronary Artery  No coronary disease noted.   Right Heart Pressures RHC Procedural Findings: Hemodynamics (mmHg) RA mean 13 RV 48/18 PA 44/19, mean 31 PCWP mean 31  Oxygen saturations: PA 76% AO 97%  Cardiac Output (Fick) 8  Cardiac Index (Fick) 3.48   Echo 10/15/17: mild LVH, EF 15-20%, diffuse HK, ?grade 2 DD, aortic root moderately dilated, trivial MR, LA mildly dilated,trivial TR, no pericardial effusion      ROS: All systems negative except as listed in HPI, PMH and Problem List.  SH:  Social History   Socioeconomic History  . Marital status: Married    Spouse name: Not on file  . Number of children: Not on file  . Years of education: Not on file  . Highest education level: Not on file  Occupational History  . Not on file  Social Needs  . Financial resource strain: Not on file  . Food insecurity:    Worry: Not on file   Inability: Not on file  . Transportation needs:    Medical: Not on file    Non-medical: Not on file  Tobacco Use  . Smoking status: Former Research scientist (life sciences)  . Smokeless tobacco: Never Used  Substance and Sexual Activity  . Alcohol use: Not Currently  . Drug use: Never  . Sexual activity: Not on file  Lifestyle  . Physical activity:    Days per week: Not on file    Minutes per session: Not on file  . Stress: Not on file  Relationships  . Social connections:    Talks on phone: Not on file    Gets together: Not on file    Attends religious service: Not on file    Active member of club or organization: Not on file    Attends meetings of clubs or organizations: Not on file    Relationship status: Not on file  . Intimate partner violence:    Fear of current or ex partner: Not on file    Emotionally abused: Not on file    Physically abused: Not on file    Forced sexual activity: Not on file  Other Topics Concern  . Not on file  Social History Narrative  . Not on file    FH:  Family History  Problem Relation Age of Onset  . Dementia Mother   . Heart disease Father   . Lung cancer Brother        SCLC  Past Medical History:  Diagnosis Date  . Former smoker   . Kidney stone   . Sucking chest wound     Current Outpatient Medications  Medication Sig Dispense Refill  . aspirin 81 MG chewable tablet Chew 81 mg by mouth as needed for mild pain.    . carvedilol (COREG) 3.125 MG tablet Take 1 tablet (3.125 mg total) by mouth 2 (two) times daily. 60 tablet 11  . famotidine (PEPCID) 20 MG tablet Take 1 tablet (20 mg total) by mouth at bedtime. 30 tablet 0  . furosemide (LASIX) 40 MG tablet Take 1 tablet (40 mg total) by mouth daily. 30 tablet 0  . losartan (COZAAR) 25 MG tablet Take 0.5 tablets (12.5 mg total) by mouth at bedtime. 30 tablet 0  . spironolactone (ALDACTONE) 25 MG tablet Take 0.5 tablets (12.5 mg total) by mouth daily. 30 tablet 0   No current facility-administered  medications for this encounter.     Vitals:   11/22/17 1121  BP: 110/76  Pulse: 67  SpO2: 98%  Weight: 122.7 kg (270 lb 9.6 oz)   Wt Readings from Last 3 Encounters:  11/22/17 122.7 kg (270 lb 9.6 oz)  10/31/17 119.5 kg (263 lb 6.4 oz)  10/21/17 118.3 kg (260 lb 11.2 oz)    Vest Reading 44%.   PHYSICAL EXAM: General:  No resp difficulty. Walked in the clinic HEENT: normal Neck: supple. JVP 11-12 . Carotids 2+ bilat; no bruits. No lymphadenopathy or thryomegaly appreciated. Cor: PMI nondisplaced. Regular rate & rhythm. No rubs, gallops or murmurs. Lungs: clear Abdomen: obese, nontender, nondistended. No hepatosplenomegaly. No bruits or masses. Good bowel sounds. Extremities: no cyanosis, clubbing, rash, edema Neuro: alert & orientedx3, cranial nerves grossly intact. moves all 4 extremities w/o difficulty. Affect pleasant .  ASSESSMENT & PLAN:  1. Chronic systolic CHF: Echo 8/67/54GBEE EF 15-20%. Nonischemic cardiomyopathy, no significant CAD on coronary angiography today. No recent viral URI symptoms. TSH normal. Possible viral myocarditis. Cardiac MRI with EF 18%, no LGE. RHC with preserved cardiac output and elevated filling pressures.  Narrow  QRS NYHA IIIb. Vest reading 44%. Volume status elevated. Increase lasix to 80 mg daily for 2 days then back to 40 mg daily. I suspect this is due to high sodium diet and fluid intak.  Discussed low salt food choices and to limit fluid intake to < 2 liters per day.  - Continue lasix 40 mg daily.  -Continue spironolactone 12.5 mg daily  -Continue losartan 12.5 mg every night.  Intolerant  entresto--> had hypotension.  - Continue low dose carvedilol 3.125 mg twice a day.  .  2. Cirrhosis: Noted on CTA. Viral hepatitis labs negative.  No history of heavy ETOH.  Liver MRI showed cirrhosis and liver cysts.   3. Snoring:  Sleep study set up for this week.   4. Smoking: He has not been smoking.  5. Obesity Body mass index is 41.14  kg/m.  Discussed limiting portion.   Discussed Vest reading and purpose of sleep study.  Follow up in 6 weeks with Dr Aundra Dubin and an ECHO. Check BMET and BNP at that time.  Greater than 50% of the 25 minute visit was spent in counseling/coordination of care regarding disease state education, salt/fluid restriction, sliding scale diuretics, and medication compliance.  David Entwistle NP-C  11:49 AM

## 2017-11-22 NOTE — Patient Instructions (Signed)
INCREASE Lasix to 80 mg for 2 days only, then resume 40 mg Daily.  Echocardiogram and Follow up with Dr. Aundra Dubin the end of October.

## 2017-11-24 ENCOUNTER — Ambulatory Visit (HOSPITAL_BASED_OUTPATIENT_CLINIC_OR_DEPARTMENT_OTHER): Payer: Medicare Other | Attending: Adult Health | Admitting: Cardiology

## 2017-11-24 VITALS — Ht 68.0 in | Wt 260.0 lb

## 2017-11-24 DIAGNOSIS — I5043 Acute on chronic combined systolic (congestive) and diastolic (congestive) heart failure: Secondary | ICD-10-CM | POA: Insufficient documentation

## 2017-11-24 DIAGNOSIS — G4733 Obstructive sleep apnea (adult) (pediatric): Secondary | ICD-10-CM | POA: Diagnosis not present

## 2017-11-24 DIAGNOSIS — R0683 Snoring: Secondary | ICD-10-CM | POA: Diagnosis not present

## 2017-12-03 NOTE — Procedures (Addendum)
   Patient Name: David Castaneda, David Castaneda Date: 11/24/2017 Gender: Male D.O.B: 1950/06/15 Age (years): 86 Referring Provider: Darrick Grinder NP Height (inches): 68 Interpreting Physician: Fransico Him MD, ABSM Weight (lbs): 260 RPSGT: Baxter Flattery BMI: 40 MRN: [id number] Neck Size: 17.00  CLINICAL INFORMATION Sleep Study Type: NPSG  Indication for sleep study: Fatigue, Obesity, Snoring, Witnesses Apnea / Gasping During Sleep  Epworth Sleepiness Score: 14  SLEEP STUDY TECHNIQUE As per the AASM Manual for the Scoring of Sleep and Associated Events v2.3 (April 2016) with a hypopnea requiring 4% desaturations.  The channels recorded and monitored were frontal, central and occipital EEG, electrooculogram (EOG), submentalis EMG (chin), nasal and oral airflow, thoracic and abdominal wall motion, anterior tibialis EMG, snore microphone, electrocardiogram, and pulse oximetry.  MEDICATIONS Medications self-administered by patient taken the night of the study : N/A  SLEEP ARCHITECTURE The study was initiated at 9:53:05 PM and ended at 4:38:54 AM.  Sleep onset time was 3.3 minutes and the sleep efficiency was 53.6%%. The total sleep time was 217.5 minutes.  Stage REM latency was N/A minutes.  The patient spent 15.9%% of the night in stage N1 sleep, 84.1%% in stage N2 sleep, 0.0%% in stage N3 and 0% in REM.  Alpha intrusion was absent.  Supine sleep was 100.00%.  RESPIRATORY PARAMETERS The overall apnea/hypopnea index (AHI) was 93.0 per hour. There were 324 total apneas, including 291 obstructive, 14 central and 19 mixed apneas. There were 13 hypopneas and 1 RERAs.  The AHI during Stage REM sleep was N/A per hour.  AHI while supine was 93.0 per hour.  The mean oxygen saturation was 92.1%. The minimum SpO2 during sleep was 81.0%.  loud snoring was noted during this study.  CARDIAC DATA The 2 lead EKG demonstrated sinus rhythm. The mean heart rate was 65.6 beats per minute. Other EKG  findings include: PVCs.  LEG MOVEMENT DATA The total PLMS were 0 with a resulting PLMS index of 0.0. Associated arousal with leg movement index was 0.3 .  IMPRESSIONS - Severe obstructive sleep apnea occurred during this study (AHI = 93.0/h). - No significant central sleep apnea occurred during this study (CAI = 3.9/h). - Mild oxygen desaturation was noted during this study (Min O2 = 81.0%). - The patient snored with loud snoring volume. - EKG findings include PVCs. - Clinically significant periodic limb movements did not occur during sleep. No significant associated arousals.  DIAGNOSIS - Obstructive Sleep Apnea (327.23 [G47.33 ICD-10]) - Nocturnal Hypoxemia (327.26 [G47.36 ICD-10])  RECOMMENDATIONS - Therapeutic CPAP titration to determine optimal pressure required to alleviate sleep disordered breathing. - Positional therapy avoiding supine position during sleep. - Avoid alcohol, sedatives and other CNS depressants that may worsen sleep apnea and disrupt normal sleep architecture. - Sleep hygiene should be reviewed to assess factors that may improve sleep quality. - Weight management and regular exercise should be initiated or continued if appropriate.  [Electronically signed] 12/03/2017 08:51 PM  Fransico Him MD, ABSM Diplomate, American Board of Sleep Medicine

## 2017-12-06 ENCOUNTER — Telehealth: Payer: Self-pay | Admitting: *Deleted

## 2017-12-06 DIAGNOSIS — R0683 Snoring: Secondary | ICD-10-CM

## 2017-12-06 DIAGNOSIS — E669 Obesity, unspecified: Secondary | ICD-10-CM

## 2017-12-06 NOTE — Telephone Encounter (Signed)
-----   Message from Sueanne Margarita, MD sent at 12/03/2017  8:53 PM EDT ----- Please let patient know that they have sleep apnea and recommend CPAP titration. Please set up titration in the sleep lab.

## 2017-12-06 NOTE — Telephone Encounter (Signed)
Informed patient of sleep study results and patient understanding was verbalized. Patient understands his sleep study showed they have sleep apnea and recommend CPAP titration.

## 2017-12-07 NOTE — Telephone Encounter (Signed)
Pt is aware and agreeable to these results.

## 2017-12-13 ENCOUNTER — Other Ambulatory Visit (HOSPITAL_COMMUNITY): Payer: Self-pay | Admitting: Adult Health

## 2017-12-14 ENCOUNTER — Telehealth: Payer: Self-pay | Admitting: *Deleted

## 2017-12-14 ENCOUNTER — Other Ambulatory Visit (HOSPITAL_COMMUNITY): Payer: Self-pay | Admitting: Adult Health

## 2017-12-14 ENCOUNTER — Encounter: Payer: Self-pay | Admitting: *Deleted

## 2017-12-14 NOTE — Telephone Encounter (Signed)
Patient is scheduled for CPAP Titration on 01/12/18. Patient understands his titration study will be done at [facility] sleep lab. Patient understands he will receive a letter in a week or so detailing appointment, date, time, and location. Patient understands to call if he does not receive the letter  in a timely manner. Left detailed message on voicemail with date and time of titration and informed patient to call back to confirm or reschedule.

## 2017-12-14 NOTE — Telephone Encounter (Signed)
-----   Message from Lauralee Evener, [place] sent at 12/08/2017 11:56 AM EDT ----- Regarding: RE: pre cert No PA is required. Patient is Medicare. ----- Message ----- From: Freada Bergeron, CMA Sent: 12/07/2017   3:31 PM EDT To: [facility] Sleep Studies Subject: pre cert                                       cpap titration

## 2017-12-15 NOTE — Telephone Encounter (Signed)
Patient confirmed his appointment.

## 2018-01-12 ENCOUNTER — Encounter (HOSPITAL_BASED_OUTPATIENT_CLINIC_OR_DEPARTMENT_OTHER): Payer: PRIVATE HEALTH INSURANCE

## 2018-01-26 ENCOUNTER — Telehealth: Payer: Self-pay | Admitting: *Deleted

## 2018-02-01 ENCOUNTER — Encounter (HOSPITAL_COMMUNITY): Payer: Medicare Other | Admitting: Cardiology

## 2018-02-01 ENCOUNTER — Other Ambulatory Visit (HOSPITAL_COMMUNITY): Payer: Medicare Other

## 2018-02-08 ENCOUNTER — Other Ambulatory Visit (HOSPITAL_COMMUNITY): Payer: Self-pay | Admitting: Adult Health

## 2018-02-09 ENCOUNTER — Ambulatory Visit (HOSPITAL_BASED_OUTPATIENT_CLINIC_OR_DEPARTMENT_OTHER)
Admission: RE | Admit: 2018-02-09 | Discharge: 2018-02-09 | Disposition: A | Discharge status: Home / Self Care | Payer: Medicare Other | Source: Ambulatory Visit | Attending: Cardiology | Admitting: Cardiology

## 2018-02-09 ENCOUNTER — Ambulatory Visit (HOSPITAL_COMMUNITY)
Admission: RE | Admit: 2018-02-09 | Discharge: 2018-02-09 | Disposition: A | Discharge status: Home / Self Care | Payer: Medicare Other | Source: Ambulatory Visit | Attending: Adult Health | Admitting: Adult Health

## 2018-02-09 ENCOUNTER — Encounter (HOSPITAL_COMMUNITY): Payer: Self-pay | Admitting: Cardiology

## 2018-02-09 VITALS — BP 132/98 | HR 77 | Wt 276.2 lb

## 2018-02-09 DIAGNOSIS — I428 Other cardiomyopathies: Secondary | ICD-10-CM | POA: Diagnosis not present

## 2018-02-09 DIAGNOSIS — G4733 Obstructive sleep apnea (adult) (pediatric): Secondary | ICD-10-CM | POA: Insufficient documentation

## 2018-02-09 DIAGNOSIS — I5042 Chronic combined systolic (congestive) and diastolic (congestive) heart failure: Secondary | ICD-10-CM

## 2018-02-09 DIAGNOSIS — I5043 Acute on chronic combined systolic (congestive) and diastolic (congestive) heart failure: Secondary | ICD-10-CM

## 2018-02-09 DIAGNOSIS — Z79899 Other long term (current) drug therapy: Secondary | ICD-10-CM | POA: Diagnosis not present

## 2018-02-09 DIAGNOSIS — K7460 Unspecified cirrhosis of liver: Secondary | ICD-10-CM | POA: Insufficient documentation

## 2018-02-09 DIAGNOSIS — Z87891 Personal history of nicotine dependence: Secondary | ICD-10-CM | POA: Insufficient documentation

## 2018-02-09 DIAGNOSIS — Z8249 Family history of ischemic heart disease and other diseases of the circulatory system: Secondary | ICD-10-CM | POA: Diagnosis not present

## 2018-02-09 DIAGNOSIS — I5022 Chronic systolic (congestive) heart failure: Secondary | ICD-10-CM | POA: Diagnosis present

## 2018-02-09 LAB — BASIC METABOLIC PANEL
ANION GAP: 5 (ref 5–15)
BUN: 12 mg/dL (ref 8–23)
CHLORIDE: 104 mmol/L (ref 98–111)
CO2: 28 mmol/L (ref 22–32)
Calcium: 9.1 mg/dL (ref 8.9–10.3)
Creatinine, Ser: 0.75 mg/dL (ref 0.61–1.24)
GFR calc Af Amer: >60 mL/min (ref >60)
GFR calc non Af Amer: >60 mL/min (ref >60)
GLUCOSE: 92 mg/dL (ref 70–99)
POTASSIUM: 4.3 mmol/L (ref 3.5–5.1)
Sodium: 137 mmol/L (ref 135–145)

## 2018-02-09 MED ORDER — FUROSEMIDE 40 MG PO TABS: ORAL_TABLET | ORAL | 6 refills | Status: DC

## 2018-02-09 MED ORDER — SPIRONOLACTONE 25 MG PO TABS: 25.0000 mg | ORAL_TABLET | Freq: Every day | ORAL | 6 refills | Status: DC

## 2018-02-09 MED ORDER — LOSARTAN POTASSIUM 25 MG PO TABS: 25.0000 mg | ORAL_TABLET | Freq: Every day | ORAL | 6 refills | Status: DC

## 2018-02-09 NOTE — Progress Notes (Signed)
  Echocardiogram 2D Echocardiogram has been performed.  David Castaneda 02/09/2018, 2:45 PM

## 2018-02-09 NOTE — Patient Instructions (Signed)
Increase Losartan 25mg  daily  Increase Spironolactone 25mg  daily  Increase Lasix 40mg  (1 tab) in the morning, Lasix 20mg  (0.5 tab) every evening.  Labs drawn today, will call you with results if abnormal.    Repeat Labs in 10 days  Your physician requested you schedule an appointment with the Pharmacist Erika in 3 weeks for medication titration.  Your physician recommends that you schedule a follow-up appointment in: 2 months with Dr. Aundra Dubin.

## 2018-02-11 NOTE — Progress Notes (Signed)
PCP: None  Primary Cardiologist: Dr Aundra Dubin   HPI: Mr Poche is a 67 year old with history of NICM, smoking, cirrhosis, and systolic heart failure.   Admitted 10/13/2017 with increase SOB/CP. HF team consulted. RHC/LHC as noted below with normal cors and adequate cardiac output. SBP was soft so no BB was added. Placed on low dose losartn and spiro. Discharge weight 260 pounds.   Echo was done today and reviewed: EF up to 35%, diffuse hypokinesis.    Today he returns for followup of CHF.  He is generally doing well.  However, weight is up about 6 lbs.  He is short of breath if he "hurries."  No orthopnea/PND. No lightheadedness.  No orthopnea/PND.  No chest pain. No palpitations.    Labs (7/19): BNP 43, K 4.1, creatinine 0.91  PMH: 1. Chronic systolic CHF: Nonischemic cardiomyopathy.  - LHC/RHC (7/19): No significant coronary disease.  Mean RA 13, PA 44/19, mean PCWP 31, CI 3.48.  - Cardiac MRI (7/19): EF 18%, mild-moderately decreased RV systolic function, ascending aorta 4.4 cm.  - Echo (11/19): EF 35%, diffuse hypokinesis, mild LVH, aortic root 4.3 cm, normal RV size and systolic function.  2. Ascending aortic aneurysm: 4.4 cm on cardiac MRI 7/19.  3. OSA: Severe on 8/19 sleep study.  4. Cirrhosis: Viral hepatitis labs negative, no history of heavy ETOH.  5. Nephrolithiasis.   ROS: All systems negative except as listed in HPI, PMH and Problem List.  Social History   Socioeconomic History  . Marital status: Married    Spouse name: Not on file  . Number of children: Not on file  . Years of education: Not on file  . Highest education level: Not on file  Occupational History  . Not on file  Social Needs  . Financial resource strain: Not on file  . Food insecurity:    Worry: Not on file    Inability: Not on file  . Transportation needs:    Medical: Not on file    Non-medical: Not on file  Tobacco Use  . Smoking status: Former Research scientist (life sciences)  . Smokeless tobacco: Never Used    Substance and Sexual Activity  . Alcohol use: Not Currently  . Drug use: Never  . Sexual activity: Not on file  Lifestyle  . Physical activity:    Days per week: Not on file    Minutes per session: Not on file  . Stress: Not on file  Relationships  . Social connections:    Talks on phone: Not on file    Gets together: Not on file    Attends religious service: Not on file    Active member of club or organization: Not on file    Attends meetings of clubs or organizations: Not on file    Relationship status: Not on file  . Intimate partner violence:    Fear of current or ex partner: Not on file    Emotionally abused: Not on file    Physically abused: Not on file    Forced sexual activity: Not on file  Other Topics Concern  . Not on file  Social History Narrative  . Not on file    Family History  Problem Relation Age of Onset  . Dementia Mother   . Heart disease Father   . Lung cancer Brother        SCLC    Past Medical History:  Diagnosis Date  . Former smoker   . Kidney stone   .  Sucking chest wound     Current Outpatient Medications  Medication Sig Dispense Refill  . aspirin 81 MG chewable tablet Chew 81 mg by mouth as needed for mild pain.    . carvedilol (COREG) 3.125 MG tablet Take 1 tablet (3.125 mg total) by mouth 2 (two) times daily. 60 tablet 11  . famotidine (PEPCID) 20 MG tablet Take 1 tablet (20 mg total) by mouth at bedtime. 30 tablet 0  . furosemide (LASIX) 40 MG tablet Take 1 tablet (40 mg total) by mouth every morning AND 0.5 tablets (20 mg total) every evening. 45 tablet 6  . losartan (COZAAR) 25 MG tablet Take 1 tablet (25 mg total) by mouth daily. 30 tablet 6  . spironolactone (ALDACTONE) 25 MG tablet Take 1 tablet (25 mg total) by mouth daily. 30 tablet 6   No current facility-administered medications for this encounter.     Vitals:   02/09/18 1449  BP: (!) 132/98  Pulse: 77  SpO2: 98%  Weight: 125.3 kg (276 lb 3.2 oz)   Wt Readings  from Last 3 Encounters:  02/09/18 125.3 kg (276 lb 3.2 oz)  11/24/17 117.9 kg (260 lb)  11/22/17 122.7 kg (270 lb 9.6 oz)     PHYSICAL EXAM: General: NAD Neck: JVP 8-9 cm, no thyromegaly or thyroid nodule.  Lungs: Clear to auscultation bilaterally with normal respiratory effort. CV: Nondisplaced PMI.  Heart regular S1/S2, no S3/S4, no murmur.  1+ edema 1/2 to knees bilaterally.   Abdomen: Soft, nontender, no hepatosplenomegaly, no distention.  Skin: Intact without lesions or rashes.  Neurologic: Alert and oriented x 3.  Psych: Normal affect. Extremities: No clubbing or cyanosis.  HEENT: Normal.   ASSESSMENT & PLAN:  1. Chronic systolic CHF: Echo 9/50 with EF 15-20%. Nonischemic cardiomyopathy, no significant CAD on coronary angiography in 7/19. No strong family history of cardiomyopathy.  No ETOH or drug abuse. Cardiac MRI with EF 18%, no LGE (7/19). RHC (7/19) with preserved cardiac output and elevated filling pressures. Possible viral myocarditis.  Echo was done today and reviewed, EF up to 35%.  Narrow  QRS.  NYHA class II symptoms, weight is up and he has some volume overload on exam.  He drinks a lot of fluid.    - We discussed fluid and sodium restriction.   - Increase Lasix to 40 qam/20 qpm.  BMET today and again in 10 days.   - Increase spironolactone to 25 mg daily.  - Increase losartan to 25 mg daily.  Intolerant of Entresto--> had hypotension.  - Continue low dose carvedilol 3.125 mg twice a day.  .  - Echo today showed some improvement in EF, up to 35%. Would hold off on ICD for now, repeat echo in 6 months.  2. Cirrhosis: Noted on CTA. Viral hepatitis labs negative.  No history of heavy ETOH.  Liver MRI showed cirrhosis and liver cysts.   3. OSA: Severe.  Awaiting CPAP.   Followup with CHF pharmacist in 3 wks for medication titration, then see me again in 2 months.   Loralie Champagne  02/11/2018

## 2018-02-16 ENCOUNTER — Ambulatory Visit (HOSPITAL_BASED_OUTPATIENT_CLINIC_OR_DEPARTMENT_OTHER): Payer: Medicare Other | Attending: Cardiology | Admitting: Cardiology

## 2018-02-16 DIAGNOSIS — R0683 Snoring: Secondary | ICD-10-CM | POA: Diagnosis present

## 2018-02-16 DIAGNOSIS — G4733 Obstructive sleep apnea (adult) (pediatric): Secondary | ICD-10-CM | POA: Diagnosis not present

## 2018-02-16 DIAGNOSIS — Z6841 Body Mass Index (BMI) 40.0 and over, adult: Secondary | ICD-10-CM | POA: Diagnosis not present

## 2018-02-16 DIAGNOSIS — E669 Obesity, unspecified: Secondary | ICD-10-CM | POA: Diagnosis not present

## 2018-02-16 DIAGNOSIS — I493 Ventricular premature depolarization: Secondary | ICD-10-CM | POA: Insufficient documentation

## 2018-02-19 NOTE — Procedures (Signed)
   Patient Name: David Castaneda, David Castaneda Date: 02/16/2018 Gender: Male D.O.B: [birth year] Age (years): 68 Referring Provider: Fransico Him MD, ABSM Height (inches): 68 Interpreting Physician: Fransico Him MD, ABSM Weight (lbs): 260 RPSGT: Jorge Ny BMI: 40 MRN: [id number] Neck Size: 17.00  CLINICAL INFORMATION  The patient is referred for a CPAP titration to treat sleep apnea.  SLEEP STUDY TECHNIQUE  As per the AASM Manual for the Scoring of Sleep and Associated Events v2.3 (April 2016) with a hypopnea requiring 4% desaturations. The channels recorded and monitored were frontal, central and occipital EEG, electrooculogram (EOG), submentalis EMG (chin), nasal and oral airflow, thoracic and abdominal wall motion, anterior tibialis EMG, snore microphone, electrocardiogram, and pulse oximetry. Continuous positive airway pressure (CPAP) was initiated at the beginning of the study and titrated to treat sleep-disordered breathing  MEDICATIONS  Medications self-administered by patient taken the night of the study : N/A  TECHNICIAN COMMENTS  Comments added by technician: NONE Comments added by scorer: N/A  RESPIRATORY PARAMETERS  5Optimal PAP Pressure (cm):11 AHI at Optimal Pressure (/hr):6.8 Overall Minimal O2 (%):87.0 Supine % at Optimal Pressure (%):32 Minimal O2 at Optimal Pressure (%):87.0  SLEEP ARCHITECTURE  The study was initiated at 11:08:47 PM and ended at 5:12:13 AM. Sleep onset time was 7.4 minutes and the sleep efficiency was 61.6%%. The total sleep time was 224 minutes. The patient spent 5.4%% of the night in stage N1 sleep, 50.7%% in stage N2 sleep, 0.0%% in stage N3 and 44% in REM.Stage REM latency was 119.5 minutes Wake after sleep onset was 132.1. Alpha intrusion was absent. Supine sleep was 46.88%.  CARDIAC DATA  The 2 lead EKG demonstrated sinus rhythm. The mean heart rate was 58.9 beats per minute. Other EKG findings include: PVCs.  LEG MOVEMENT DATA  The  total Periodic Limb Movements of Sleep (PLMS) were 0. The PLMS index was 0.0. A PLMS index of <15 is considered normal in adults.  IMPRESSIONS  - The optimal PAP pressure was 11 cm of water. - Central sleep apnea was not noted during this titration (CAI = 0.0/h). - Mild oxygen desaturations were observed during this titration (min O2 = 87.0%). - The patient snored with soft snoring volume during this titration study. - 2-lead EKG demonstrated: PVCs - Clinically significant periodic limb movements were not noted during this study. Arousals associated with PLMs were rare.  DIAGNOSIS  - Obstructive Sleep Apnea (327.23 [G47.33 ICD-10])  RECOMMENDATIONS  - Trial of CPAP therapy on 11 cm H2O with a Large size Fisher&Paykel Full Face Mask Simplus mask and heated humidification. - Avoid alcohol, sedatives and other CNS depressants that may worsen sleep apnea and disrupt normal sleep architecture. - Sleep hygiene should be reviewed to assess factors that may improve sleep quality. - Weight management and regular exercise should be initiated or continued. - Return to [facility] Center for re-evaluation after 10 weeks of therapy  [Electronically signed] 02/19/2018 09:50 AM  Fransico Him MD, ABSM Diplomate, American Board of Sleep Medicine

## 2018-02-23 ENCOUNTER — Telehealth: Payer: Self-pay | Admitting: *Deleted

## 2018-02-23 ENCOUNTER — Ambulatory Visit (HOSPITAL_COMMUNITY)
Admission: RE | Admit: 2018-02-23 | Discharge: 2018-02-23 | Disposition: A | Discharge status: Home / Self Care | Payer: Medicare Other | Source: Ambulatory Visit | Attending: Cardiology | Admitting: Cardiology

## 2018-02-23 DIAGNOSIS — I5043 Acute on chronic combined systolic (congestive) and diastolic (congestive) heart failure: Secondary | ICD-10-CM | POA: Insufficient documentation

## 2018-02-23 LAB — BASIC METABOLIC PANEL
Anion gap: 7 (ref 5–15)
BUN: 9 mg/dL (ref 8–23)
CALCIUM: 9.4 mg/dL (ref 8.9–10.3)
CO2: 26 mmol/L (ref 22–32)
CREATININE: 0.82 mg/dL (ref 0.61–1.24)
Chloride: 105 mmol/L (ref 98–111)
GFR calc Af Amer: >60 mL/min (ref >60)
GFR calc non Af Amer: >60 mL/min (ref >60)
GLUCOSE: 106 mg/dL — AB (ref 70–99)
Potassium: 4.2 mmol/L (ref 3.5–5.1)
Sodium: 138 mmol/L (ref 135–145)

## 2018-02-23 NOTE — Telephone Encounter (Signed)
-----   Message from Sueanne Margarita, MD sent at 02/19/2018  9:53 AM EST ----- Please let patient know that they had a successful PAP titration and let DME know that orders are in EPIC.  Please set up 10 week OV with me.

## 2018-02-23 NOTE — Telephone Encounter (Signed)
Called results lmtcb on cell. 

## 2018-02-26 NOTE — Telephone Encounter (Signed)
Informed patient of sleep study results and patient understanding was verbalized. Patient understands his sleep study showed they had a successful PAP titration and let DME know that orders are in EPIC. Please set up 10 week OV with me.   Upon patient request DME selection is [facility]. Patient understands he will be contacted by [facility] to set up his cpap. Patient understands to call if [facility] does not contact him with new setup in a timely manner. Patient understands they will be called once confirmation has been received from [facility] that they have received their new machine to schedule 10 week follow up appointment.  [facility] notified of new cpap order  Please add to airview Patient was grateful for the call and thanked me.    

## 2018-03-05 ENCOUNTER — Ambulatory Visit (HOSPITAL_COMMUNITY): Payer: PRIVATE HEALTH INSURANCE

## 2018-03-07 NOTE — Telephone Encounter (Signed)
Spoke with patient about Beat HF research study. Pt to call me back if interested and/or I will speak with him when he comes to see the pharmacist next week.

## 2018-03-14 ENCOUNTER — Ambulatory Visit (HOSPITAL_COMMUNITY)
Admission: RE | Admit: 2018-03-14 | Discharge: 2018-03-14 | Disposition: A | Discharge status: Home / Self Care | Payer: Medicare Other | Source: Ambulatory Visit | Attending: Internal Medicine | Admitting: Internal Medicine

## 2018-03-14 ENCOUNTER — Other Ambulatory Visit (HOSPITAL_COMMUNITY): Payer: Self-pay | Admitting: Cardiology

## 2018-03-14 DIAGNOSIS — G4733 Obstructive sleep apnea (adult) (pediatric): Secondary | ICD-10-CM | POA: Insufficient documentation

## 2018-03-14 DIAGNOSIS — I428 Other cardiomyopathies: Secondary | ICD-10-CM | POA: Diagnosis not present

## 2018-03-14 DIAGNOSIS — K7460 Unspecified cirrhosis of liver: Secondary | ICD-10-CM | POA: Diagnosis not present

## 2018-03-14 DIAGNOSIS — F17200 Nicotine dependence, unspecified, uncomplicated: Secondary | ICD-10-CM | POA: Diagnosis not present

## 2018-03-14 DIAGNOSIS — I5022 Chronic systolic (congestive) heart failure: Secondary | ICD-10-CM | POA: Diagnosis not present

## 2018-03-14 MED ORDER — LOSARTAN POTASSIUM 50 MG PO TABS: 50.0000 mg | ORAL_TABLET | Freq: Every day | ORAL | 5 refills | Status: DC

## 2018-03-14 NOTE — Progress Notes (Signed)
HF MD: [facility]  HPI:  Mr Ohern is a 67 year old with history of NICM, smoking, cirrhosis, and systolic heart failure.   Admitted 10/13/2017 with increase SOB/CP. HF team consulted. RHC/LHC as noted below with normal cors and adequate cardiac output. SBP was soft so no BB was added. Placed on low dose losartn and spiro. Discharge weight 260 pounds.   Echo was done today and reviewed: EF up to 35%, diffuse hypokinesis.    Today he returns for pharmacist-led HF medication titration with his wife. At last HF clinic visit on 11/8, his losartan was increased to 25 mg daily, spironolactone was increased to 25 mg daily and furosemide was increased to 40 mg QAM and 20 mg QPM.  He is generally doing well. He is short of breath if he "hurries" and this is stable.  No orthopnea/PND. No lightheadedness.  No orthopnea/PND.  No chest pain. No palpitations.     . Shortness of breath/dyspnea on exertion? no  . Orthopnea/PND? no . Edema? Yes - stable 1+ . Lightheadedness/dizziness? no . Daily weights at home? Yes - stable 270-275 lb . Blood pressure/heart rate monitoring at home? no . Following low-sodium/fluid-restricted diet? No - fluid intake usually >2L/day  HF Medications: Carvedilol 3.125 mg PO BID Furosemide 40 mg QAM and 20 mg QPM Losartan 25 mg PO daily Spironolactone 25 mg PO daily   Has the patient been experiencing any side effects to the medications prescribed?  no  Does the patient have any problems obtaining medications due to transportation or finances?   No   Understanding of regimen: good Understanding of indications: good Potential of compliance: good Patient understands to avoid NSAIDs. Patient understands to avoid decongestants.    Pertinent Lab Values: . 02/23/18: Serum creatinine 0.82, BUN 9, Potassium 4.2, Sodium 138  Vital Signs: . Weight: 281 lb (dry weight: 270 lb) . Blood pressure: 122/78 mmHg  . Heart rate: 60 bpm    ReDS Vest: 43%  Assessment: 1. Chronic  systolic CHF (EF 88-91%), due to NICM (?viral). NYHA class II symptoms.  - Volume status elevated based on weight gain and ReDS vest reading  - Take furosemide 40 mg BID x 2 days, then continue 40 qam and 20 qpm with additional 20 mg qpm if weight gain >3 lb in 1 day or >5 lb in 1 week  - Increase losartan to 50 mg daily   - Continue carvedilol 3.125 mg BID and spironolactone 25 mg daily  - Intolerant of Entresto--> had hypotension   - Basic disease state pathophysiology, medication indication, mechanism and side effects reviewed at length with patient and he verbalized understanding  2. Cirrhosis: Noted on CTA.Viral hepatitis labs negative. No history of heavy ETOH. Liver MRI showed cirrhosis and liver cysts.   3. OSA: Severe.  Awaiting CPAP.    Plan: 1) Medication changes: Based on clinical presentation, vital signs and recent labs will increase losartan to 50 mg daily and increases furosemide 40 mg BID x 2 days then back to 40 mg qam and 20 mg qpm with additional 20 mg PRN weight gain 2) Labs: BMET at next visit 3) Follow-up: Dr. Aundra Dubin on 05/04/18   Ruta Hinds. Velva Harman, PharmD, BCPS, CPP Clinical Pharmacist Phone: [phone] 03/14/2018 9:57 AM

## 2018-03-14 NOTE — Patient Instructions (Signed)
Please take furosemide 40 mg (1 tablet) TWICE DAILY X 2 DAYS, then resume 40 mg (1 tablet) in the morning and 20 mg (1/2 tablet) in the afternoon. If your weight increases >3 lb in 1 day or > 5 lb in 1 week, take furosemide 40 mg (1 tablet) TWICE DAILY.   Please INCREASE losartan 50 mg ONCE DAILY. You may take #2 tablets of your current losartan 25 mg until you pick up the higher strength.   Please keep your appointment with Dr. Aundra Dubin on 05/04/18.

## 2018-03-21 NOTE — Telephone Encounter (Signed)
° °  1) What problem are you experiencing? n/a  2) Who is your medical equipment company? Patient [place] requesting to use [employer] in [place], fax # [phone]   Please route to the sleep study assistant.

## 2018-03-22 NOTE — Telephone Encounter (Signed)
All paperwork has been faxed to [facility] in [place] and confirmation has been received.

## 2018-04-04 ENCOUNTER — Other Ambulatory Visit (HOSPITAL_COMMUNITY): Payer: Self-pay | Admitting: Cardiology

## 2018-04-06 ENCOUNTER — Telehealth: Payer: Self-pay | Admitting: *Deleted

## 2018-04-06 NOTE — Telephone Encounter (Signed)
Spoke with pt about Beat HF, he said he would think about it and call us back.

## 2018-05-04 ENCOUNTER — Ambulatory Visit (HOSPITAL_COMMUNITY)
Admission: RE | Admit: 2018-05-04 | Discharge: 2018-05-04 | Disposition: A | Discharge status: Home / Self Care | Payer: Medicare Other | Source: Ambulatory Visit | Attending: Cardiology | Admitting: Cardiology

## 2018-05-04 ENCOUNTER — Encounter (HOSPITAL_COMMUNITY): Payer: Self-pay | Admitting: Cardiology

## 2018-05-04 VITALS — BP 132/92 | HR 85 | Wt 288.6 lb

## 2018-05-04 DIAGNOSIS — Z87891 Personal history of nicotine dependence: Secondary | ICD-10-CM | POA: Insufficient documentation

## 2018-05-04 DIAGNOSIS — G4733 Obstructive sleep apnea (adult) (pediatric): Secondary | ICD-10-CM | POA: Insufficient documentation

## 2018-05-04 DIAGNOSIS — Z79899 Other long term (current) drug therapy: Secondary | ICD-10-CM | POA: Diagnosis not present

## 2018-05-04 DIAGNOSIS — I428 Other cardiomyopathies: Secondary | ICD-10-CM | POA: Insufficient documentation

## 2018-05-04 DIAGNOSIS — I5042 Chronic combined systolic (congestive) and diastolic (congestive) heart failure: Secondary | ICD-10-CM | POA: Insufficient documentation

## 2018-05-04 DIAGNOSIS — I712 Thoracic aortic aneurysm, without rupture: Secondary | ICD-10-CM | POA: Diagnosis not present

## 2018-05-04 DIAGNOSIS — Z7982 Long term (current) use of aspirin: Secondary | ICD-10-CM | POA: Insufficient documentation

## 2018-05-04 DIAGNOSIS — Z8249 Family history of ischemic heart disease and other diseases of the circulatory system: Secondary | ICD-10-CM | POA: Diagnosis not present

## 2018-05-04 DIAGNOSIS — K7460 Unspecified cirrhosis of liver: Secondary | ICD-10-CM | POA: Insufficient documentation

## 2018-05-04 LAB — BASIC METABOLIC PANEL
Anion gap: 9 (ref 5–15)
BUN: 18 mg/dL (ref 8–23)
CO2: 23 mmol/L (ref 22–32)
CREATININE: 0.86 mg/dL (ref 0.61–1.24)
Calcium: 9.5 mg/dL (ref 8.9–10.3)
Chloride: 104 mmol/L (ref 98–111)
GFR calc Af Amer: >60 mL/min (ref >60)
GFR calc non Af Amer: >60 mL/min (ref >60)
Glucose, Bld: 84 mg/dL (ref 70–99)
Potassium: 4.2 mmol/L (ref 3.5–5.1)
Sodium: 136 mmol/L (ref 135–145)

## 2018-05-04 MED ORDER — FUROSEMIDE 40 MG PO TABS: ORAL_TABLET | ORAL | 6 refills | Status: DC

## 2018-05-04 MED ORDER — SACUBITRIL-VALSARTAN 24-26 MG PO TABS: 1.0000 | ORAL_TABLET | Freq: Two times a day (BID) | ORAL | 6 refills | Status: DC

## 2018-05-04 NOTE — Patient Instructions (Addendum)
STOP Losartan  INCREASE Lasix to 60mg  (1.5 tab) twice a day for 3 days, THEN take Lasix 60mg  (1.5 tab)  in the morning and 40mg  (1 tab) in the evening.  START Entresto 24/26mg  (1 tab) twice a day.  Labs today and REPEAT labs in 10 days We will only contact you if something comes back abnormal or we need to make some changes. Otherwise no news is good news!  Your physician recommends that you schedule a follow-up appointment in: 3 weeks with NP/PA and 2 months with Dr. Aundra Dubin

## 2018-05-06 NOTE — Progress Notes (Signed)
PCP: Patient, No Pcp Per Primary Cardiologist: Dr Aundra Dubin   HPI: David Castaneda is a 68 year old with history of NICM, smoking, cirrhosis, and systolic heart failure.   Admitted 10/13/2017 with increase SOB/CP. HF team consulted. RHC/LHC as noted below with normal cors and adequate cardiac output. SBP was soft so no BB was added. Placed on low dose losartn and spiro. Discharge weight 260 pounds.   Echo in 11/19 showed EF up to 35%, diffuse hypokinesis.    Today he returns for followup of CHF.  He is using CPAP.  He is short of breath carrying a heavy load, not short of breath walking on flat ground.  No orthopnea/PND. He has been fairly sedentary recently.  No lightheadedness, palpitations, or chest pain.  Weight is up about 12 lbs.    Labs (7/19): BNP 43, K 4.1, creatinine 0.91 Labs (11/19): K 4.2, creatinine 0.82  PMH: 1. Chronic systolic CHF: Nonischemic cardiomyopathy.  - LHC/RHC (7/19): No significant coronary disease.  Mean RA 13, PA 44/19, mean PCWP 31, CI 3.48.  - Cardiac MRI (7/19): EF 18%, mild-moderately decreased RV systolic function, ascending aorta 4.4 cm.  - Echo (11/19): EF 35%, diffuse hypokinesis, mild LVH, aortic root 4.3 cm, normal RV size and systolic function.  2. Ascending aortic aneurysm: 4.4 cm on cardiac MRI 7/19.  3. OSA: Severe on 8/19 sleep study. Uses CPAP.  4. Cirrhosis: Viral hepatitis labs negative, no history of heavy ETOH.  5. Nephrolithiasis.   ROS: All systems negative except as listed in HPI, PMH and Problem List.  Social History   Socioeconomic History  . Marital status: Divorced    Spouse name: Not on file  . Number of children: Not on file  . Years of education: Not on file  . Highest education level: Not on file  Occupational History  . Not on file  Social Needs  . Financial resource strain: Not on file  . Food insecurity:    Worry: Not on file    Inability: Not on file  . Transportation needs:    Medical: Not on file    Non-medical:  Not on file  Tobacco Use  . Smoking status: Former Research scientist (life sciences)  . Smokeless tobacco: Never Used  Substance and Sexual Activity  . Alcohol use: Not Currently  . Drug use: Never  . Sexual activity: Not on file  Lifestyle  . Physical activity:    Days per week: Not on file    Minutes per session: Not on file  . Stress: Not on file  Relationships  . Social connections:    Talks on phone: Not on file    Gets together: Not on file    Attends religious service: Not on file    Active member of club or organization: Not on file    Attends meetings of clubs or organizations: Not on file    Relationship status: Not on file  . Intimate partner violence:    Fear of current or ex partner: Not on file    Emotionally abused: Not on file    Physically abused: Not on file    Forced sexual activity: Not on file  Other Topics Concern  . Not on file  Social History Narrative  . Not on file    Family History  Problem Relation Age of Onset  . Dementia Mother   . Heart disease Father   . Lung cancer Brother        [facility]    Past Medical  History:  Diagnosis Date  . Former smoker   . Kidney stone   . Sucking chest wound     Current Outpatient Medications  Medication Sig Dispense Refill  . acetaminophen (TYLENOL) 500 MG tablet Take 500 mg by mouth every 6 (six) hours as needed.    Marland Kitchen aspirin 81 MG chewable tablet Chew 81 mg by mouth 2 (two) times daily.     . carvedilol (COREG) 3.125 MG tablet Take 1 tablet (3.125 mg total) by mouth 2 (two) times daily. 60 tablet 11  . famotidine (PEPCID) 20 MG tablet Take 1 tablet (20 mg total) by mouth at bedtime. 30 tablet 0  . furosemide (LASIX) 40 MG tablet Take 1.5 tablets (60 mg total) by mouth every morning AND 1 tablet (40 mg total) every evening. 80 tablet 6  . spironolactone (ALDACTONE) 25 MG tablet Take 1 tablet (25 mg total) by mouth daily. 30 tablet 6  . sacubitril-valsartan (ENTRESTO) 24-26 MG Take 1 tablet by mouth 2 (two) times daily. 60 tablet 6    No current facility-administered medications for this encounter.     Vitals:   05/04/18 1443  BP: (!) 132/92  Pulse: 85  SpO2: 96%  Weight: 130.9 kg (288 lb 9.6 oz)   Wt Readings from Last 3 Encounters:  05/04/18 130.9 kg (288 lb 9.6 oz)  03/14/18 127.5 kg (281 lb)  02/16/18 122.5 kg (270 lb)     PHYSICAL EXAM: General: NAD Neck: JVP 9-10 cm, no thyromegaly or thyroid nodule.  Lungs: Clear to auscultation bilaterally with normal respiratory effort. CV: Nondisplaced PMI.  Heart regular S1/S2, no S3/S4, no murmur.  1+ edema 1/2 up lower legs bilaterally.  No carotid bruit.  Normal pedal pulses.  Abdomen: Soft, nontender, no hepatosplenomegaly, no distention.  Skin: Intact without lesions or rashes.  Neurologic: Alert and oriented x 3.  Psych: Normal affect. Extremities: No clubbing or cyanosis.  HEENT: Normal.   ASSESSMENT & PLAN: 1. Chronic systolic CHF: Echo 3/61 with EF 15-20%. Nonischemic cardiomyopathy, no significant CAD on coronary angiography in 7/19. No strong family history of cardiomyopathy.  No ETOH or drug abuse. Cardiac MRI with EF 18%, no LGE (7/19). RHC (7/19) with preserved cardiac output and elevated filling pressures. Possible viral myocarditis.  Echo in 11/19 showed EF up to 35%.  Narrow QRS.  NYHA class II-III symptoms.  He is volume overloaded on exam with weight up.    - Increase Lasix to 60 mg bid x 3 days then 60 qam/40 qpm. BMET today and in 10 days.   - Continue spironolactone 25 mg daily.  - Stop losartan, start Entresto 24/26 bid. He did not tolerate Entresto while hospitalized but now appears more stable with higher BP.  - Continue carvedilol 3.125 mg twice a day.   - Echo in 5/20, if EF < 35%, will arrange for ICD.  2. Cirrhosis: Noted on CTA. Viral hepatitis labs negative.  No history of heavy ETOH. Liver MRI showed cirrhosis and liver cysts.   3. OSA: Severe.  Using CPAP.    Followup with NP/PA in 3 wks, followup with me in 2 months.    Loralie Champagne  05/06/2018

## 2018-05-07 NOTE — Telephone Encounter (Signed)
Patient has a 10 week follow up appointment scheduled for 05/29/2018. Patient understands he needs to keep this appointment for insurance compliance. Patient was grateful for the call and thanked me.

## 2018-05-07 NOTE — Telephone Encounter (Signed)
Upon patient request DME selection is [facility] Patient understands he will be contacted by [facility] to set up his cpap. Patient understands to call if [facility] does not contact him with new setup in a timely manner. Patient understands they will be called once confirmation has been received from [facility] that they have received their new machine to schedule 10 week follow up appointment.  [facility] notified of new cpap order  Please add to [facility] Patient was grateful for the call and thanked me.

## 2018-05-23 NOTE — Progress Notes (Signed)
PCP: Patient, No Pcp Per Primary Cardiologist: Dr Aundra Dubin   HPI: David Castaneda is a 68 year old with history of NICM, smoking, cirrhosis, and systolic heart failure.   Admitted 10/13/2017 with increase SOB/CP. HF team consulted. RHC/LHC as noted below with normal cors and adequate cardiac output. SBP was soft so no BB was added. Placed on low dose losartn and spiro. Discharge weight 260 pounds.   Echo in 11/19 showed EF up to 35%, diffuse hypokinesis.    He returns today for HF follow up. Last visit lasix was increased and he was started on Entresto. Overall doing better. SOB has improved. Wearing CPAP for about 5 hours/night. Has some swelling in his right knee, but peripheral edema much improved. Sleeps with 2 pillows at baseline, has improved from 3-4. No CP. Sometimes feels "wonky" after taking his medications, but denies dizziness. Weight down 10 lbs on our scale. Weights down 289 to 280 lbs at home, continue to trend down. Taking all medications. No problems getting Entresto.    Labs (7/19): BNP 43, K 4.1, creatinine 0.91 Labs (11/19): K 4.2, creatinine 0.82  PMH: 1. Chronic systolic CHF: Nonischemic cardiomyopathy.  - LHC/RHC (7/19): No significant coronary disease.  Mean RA 13, PA 44/19, mean PCWP 31, CI 3.48.  - Cardiac MRI (7/19): EF 18%, mild-moderately decreased RV systolic function, ascending aorta 4.4 cm.  - Echo (11/19): EF 35%, diffuse hypokinesis, mild LVH, aortic root 4.3 cm, normal RV size and systolic function.  2. Ascending aortic aneurysm: 4.4 cm on cardiac MRI 7/19.  3. OSA: Severe on 8/19 sleep study. Uses CPAP.  4. Cirrhosis: Viral hepatitis labs negative, no history of heavy ETOH.  5. Nephrolithiasis.   Review of systems complete and found to be negative unless listed in HPI.   Social History   Socioeconomic History  . Marital status: Divorced    Spouse name: Not on file  . Number of children: Not on file  . Years of education: Not on file  . Highest education  level: Not on file  Occupational History  . Not on file  Social Needs  . Financial resource strain: Not on file  . Food insecurity:    Worry: Not on file    Inability: Not on file  . Transportation needs:    Medical: Not on file    Non-medical: Not on file  Tobacco Use  . Smoking status: Former Research scientist (life sciences)  . Smokeless tobacco: Never Used  Substance and Sexual Activity  . Alcohol use: Not Currently  . Drug use: Never  . Sexual activity: Not on file  Lifestyle  . Physical activity:    Days per week: Not on file    Minutes per session: Not on file  . Stress: Not on file  Relationships  . Social connections:    Talks on phone: Not on file    Gets together: Not on file    Attends religious service: Not on file    Active member of club or organization: Not on file    Attends meetings of clubs or organizations: Not on file    Relationship status: Not on file  . Intimate partner violence:    Fear of current or ex partner: Not on file    Emotionally abused: Not on file    Physically abused: Not on file    Forced sexual activity: Not on file  Other Topics Concern  . Not on file  Social History Narrative  . Not on file  Family History  Problem Relation Age of Onset  . Dementia Mother   . Heart disease Father   . Lung cancer Brother        [facility]    Past Medical History:  Diagnosis Date  . Former smoker   . Kidney stone   . Sucking chest wound     Current Outpatient Medications  Medication Sig Dispense Refill  . acetaminophen (TYLENOL) 500 MG tablet Take 500 mg by mouth every 6 (six) hours as needed.    Marland Kitchen aspirin 81 MG chewable tablet Chew 81 mg by mouth 2 (two) times daily.     . carvedilol (COREG) 3.125 MG tablet Take 1 tablet (3.125 mg total) by mouth 2 (two) times daily. 60 tablet 11  . famotidine (PEPCID) 20 MG tablet Take 1 tablet (20 mg total) by mouth at bedtime. 30 tablet 0  . furosemide (LASIX) 40 MG tablet Take 1.5 tablets (60 mg total) by mouth every morning  AND 1 tablet (40 mg total) every evening. 80 tablet 6  . sacubitril-valsartan (ENTRESTO) 24-26 MG Take 1 tablet by mouth 2 (two) times daily. 60 tablet 6  . spironolactone (ALDACTONE) 25 MG tablet Take 1 tablet (25 mg total) by mouth daily. 30 tablet 6   No current facility-administered medications for this encounter.     Vitals:   05/24/18 1512  BP: 112/78  Pulse: 67  SpO2: 97%  Weight: 126.6 kg (279 lb 3.2 oz)   Wt Readings from Last 3 Encounters:  05/24/18 126.6 kg (279 lb 3.2 oz)  05/04/18 130.9 kg (288 lb 9.6 oz)  03/14/18 127.5 kg (281 lb)     PHYSICAL EXAM: General: Obese. No resp difficulty. HEENT: Normal Neck: Supple. JVP 7-8. Carotids 2+ bilat; no bruits. No thyromegaly or nodule noted. Cor: PMI nondisplaced. RRR, No M/G/R noted Lungs: CTAB, normal effort. Abdomen: obese, soft, non-tender, non-distended, no HSM. No bruits or masses. +BS  Extremities: No cyanosis, clubbing, or rash. R and LLE no edema.  Neuro: Alert & orientedx3, cranial nerves grossly intact. moves all 4 extremities w/o difficulty. Affect pleasant   ASSESSMENT & PLAN: 1. Chronic systolic CHF: Echo 9/76 with EF 15-20%. Nonischemic cardiomyopathy, no significant CAD on coronary angiography in 7/19. No strong family history of cardiomyopathy.  No ETOH or drug abuse. Cardiac MRI with EF 18%, no LGE (7/19). RHC (7/19) with preserved cardiac output and elevated filling pressures. Possible viral myocarditis.  Echo in 11/19 showed EF up to 35%.  Narrow QRS.  NYHA class II-III symptoms.   - Volume much improved. Weight continues to trend down at home.  - Continue Lasix 60 qam/40 qpm. BMET today.  - Continue spironolactone 25 mg daily.  - Continue Entresto 24/26 bid. - Continue carvedilol 3.125 mg twice a day. - We were planning on getting at echo in May to reassess EF (6 months from last echo), but they live an hour away. I will add an echo to his appointment in April with Dr Aundra Dubin.  We discussed that  he will need ICD if EF <35% on repeat.  2. Cirrhosis: Noted on CTA. Viral hepatitis labs negative.  No history of heavy ETOH. Liver MRI showed cirrhosis and liver cysts.  No change.   3. OSA: Severe.  Continue CPAP.    BMET Has f/u with Dr Aundra Dubin in 2 months. Add echo to that appointment.  Renewed handicap sticker today.   Georgiana Shore, NP 05/24/2018  Greater than 50% of the 25 minute visit was  spent in counseling/coordination of care regarding disease state education, salt/fluid restriction, sliding scale diuretics, and medication compliance.

## 2018-05-24 ENCOUNTER — Ambulatory Visit (HOSPITAL_COMMUNITY)
Admission: RE | Admit: 2018-05-24 | Discharge: 2018-05-24 | Disposition: A | Discharge status: Home / Self Care | Payer: Medicare Other | Source: Ambulatory Visit | Attending: Cardiology | Admitting: Cardiology

## 2018-05-24 VITALS — BP 112/78 | HR 67 | Wt 279.2 lb

## 2018-05-24 DIAGNOSIS — Z79899 Other long term (current) drug therapy: Secondary | ICD-10-CM | POA: Insufficient documentation

## 2018-05-24 DIAGNOSIS — Z801 Family history of malignant neoplasm of trachea, bronchus and lung: Secondary | ICD-10-CM | POA: Diagnosis not present

## 2018-05-24 DIAGNOSIS — K7460 Unspecified cirrhosis of liver: Secondary | ICD-10-CM | POA: Insufficient documentation

## 2018-05-24 DIAGNOSIS — I5022 Chronic systolic (congestive) heart failure: Secondary | ICD-10-CM

## 2018-05-24 DIAGNOSIS — I428 Other cardiomyopathies: Secondary | ICD-10-CM | POA: Diagnosis not present

## 2018-05-24 DIAGNOSIS — Z87891 Personal history of nicotine dependence: Secondary | ICD-10-CM | POA: Diagnosis not present

## 2018-05-24 DIAGNOSIS — E669 Obesity, unspecified: Secondary | ICD-10-CM

## 2018-05-24 DIAGNOSIS — G4733 Obstructive sleep apnea (adult) (pediatric): Secondary | ICD-10-CM | POA: Insufficient documentation

## 2018-05-24 DIAGNOSIS — Z9989 Dependence on other enabling machines and devices: Secondary | ICD-10-CM | POA: Diagnosis not present

## 2018-05-24 DIAGNOSIS — Z8249 Family history of ischemic heart disease and other diseases of the circulatory system: Secondary | ICD-10-CM | POA: Diagnosis not present

## 2018-05-24 DIAGNOSIS — I5032 Chronic diastolic (congestive) heart failure: Secondary | ICD-10-CM

## 2018-05-24 DIAGNOSIS — Z7982 Long term (current) use of aspirin: Secondary | ICD-10-CM | POA: Insufficient documentation

## 2018-05-24 LAB — BASIC METABOLIC PANEL
Anion gap: 11 (ref 5–15)
BUN: 29 mg/dL — ABNORMAL HIGH (ref 8–23)
CO2: 23 mmol/L (ref 22–32)
Calcium: 9.1 mg/dL (ref 8.9–10.3)
Chloride: 103 mmol/L (ref 98–111)
Creatinine, Ser: 1.11 mg/dL (ref 0.61–1.24)
GFR calc Af Amer: >60 mL/min (ref >60)
GFR calc non Af Amer: >60 mL/min (ref >60)
Glucose, Bld: 90 mg/dL (ref 70–99)
Potassium: 4.1 mmol/L (ref 3.5–5.1)
Sodium: 137 mmol/L (ref 135–145)

## 2018-05-24 NOTE — Patient Instructions (Signed)
Lab work done today. We will notify you of any abnormal lab work.  Your physician has requested that you have an echocardiogram. Echocardiography is a painless test that uses sound waves to create images of your heart. It provides your doctor with information about the size and shape of your heart and how well your heart's chambers and valves are working. This procedure takes approximately one hour. There are no restrictions for this procedure.  Follow up with Dr. Aundra Dubin in April with an echocardiogram.

## 2018-05-29 ENCOUNTER — Ambulatory Visit: Payer: PRIVATE HEALTH INSURANCE | Admitting: Cardiology

## 2018-05-29 ENCOUNTER — Encounter

## 2018-06-01 NOTE — Addendum Note (Signed)
Encounter addended by: Georgiana Shore, NP on: 06/01/2018 3:18 PM  Actions taken: LOS modified, Visit diagnoses modified

## 2018-06-04 ENCOUNTER — Encounter: Payer: Self-pay | Admitting: Cardiology

## 2018-06-04 ENCOUNTER — Ambulatory Visit (INDEPENDENT_AMBULATORY_CARE_PROVIDER_SITE_OTHER): Payer: Medicare Other | Admitting: Cardiology

## 2018-06-04 DIAGNOSIS — G4733 Obstructive sleep apnea (adult) (pediatric): Secondary | ICD-10-CM

## 2018-06-04 HISTORY — DX: Obstructive sleep apnea (adult) (pediatric): G47.33

## 2018-06-04 NOTE — Patient Instructions (Signed)

## 2018-06-04 NOTE — Progress Notes (Signed)
Cardiology Office Note:    Date:  06/04/2018   ID:  David Castaneda, Speed Nov 22, 1950, MRN [id number]  PCP:  Patient, No Pcp Per  Cardiologist:  No primary care provider on file.    Referring MD: Conrad [place], NP   Chief Complaint  Patient presents with  . Sleep Apnea  . Hypertension    History of Present Illness:    David Castaneda is a 68 y.o. male with a hx of chronic systolic CHF who was referred by Dr. Algernon Huxley for sleep study.  His Epworth sleepiness score was 14 and he was complaining of fatigue, snoring, witnessed apnea and waking gasping for breath at night.  Study showed severe obstructive sleep apnea with an AHI of 93/h with oxygen desaturations as low as 81%.  Underwent CPAP titration to 11 cm H2O.  He is doing well with his CPAP device and thinks that he has gotten used to it.  He tolerates the mask and feels the pressure is adequate.  Since going on CPAP he feels rested in the am and has no significant daytime sleepiness.  He denies any significant mouth or nasal dryness or nasal congestion.  He does not think that he snores.    meds and is tolerating meds with no SE.    Past Medical History:  Diagnosis Date  . Former smoker   . Kidney stone   . OSA (obstructive sleep apnea) 06/04/2018   Severe obstructive sleep apnea with an AHI of 93/h with oxygen desaturations as low as 81%. Now on CPAP at 11 cm H2O.   . Sucking chest wound     Past Surgical History:  Procedure Laterality Date  . wound     wound in back in war    Current Medications: Current Meds  Medication Sig  . acetaminophen (TYLENOL) 500 MG tablet Take 500 mg by mouth every 6 (six) hours as needed.  Marland Kitchen aspirin 81 MG chewable tablet Chew 81 mg by mouth 2 (two) times daily.   . carvedilol (COREG) 3.125 MG tablet Take 1 tablet (3.125 mg total) by mouth 2 (two) times daily.  . famotidine (PEPCID) 20 MG tablet Take 1 tablet (20 mg total) by mouth at bedtime.  . furosemide (LASIX) 40 MG tablet Take 1.5 tablets (60 mg  total) by mouth every morning AND 1 tablet (40 mg total) every evening.  . sacubitril-valsartan (ENTRESTO) 24-26 MG Take 1 tablet by mouth 2 (two) times daily.  Marland Kitchen spironolactone (ALDACTONE) 25 MG tablet Take 1 tablet (25 mg total) by mouth daily.     Allergies:   Patient has no known allergies.   Social History   Socioeconomic History  . Marital status: Divorced    Spouse name: Not on file  . Number of children: Not on file  . Years of education: Not on file  . Highest education level: Not on file  Occupational History  . Not on file  Social Needs  . Financial resource strain: Not on file  . Food insecurity:    Worry: Not on file    Inability: Not on file  . Transportation needs:    Medical: Not on file    Non-medical: Not on file  Tobacco Use  . Smoking status: Former Research scientist (life sciences)  . Smokeless tobacco: Never Used  Substance and Sexual Activity  . Alcohol use: Not Currently  . Drug use: Never  . Sexual activity: Not on file  Lifestyle  . Physical activity:    Days per week: Not  on file    Minutes per session: Not on file  . Stress: Not on file  Relationships  . Social connections:    Talks on phone: Not on file    Gets together: Not on file    Attends religious service: Not on file    Active member of club or organization: Not on file    Attends meetings of clubs or organizations: Not on file    Relationship status: Not on file  Other Topics Concern  . Not on file  Social History Narrative  . Not on file     Family History: The patient's family history includes Dementia in his mother; Heart disease in his father; Lung cancer in his brother.  ROS:   Please see the history of present illness.    ROS  All other systems reviewed and negative.   EKGs/Labs/Other Studies Reviewed:    The following studies were reviewed today: PAP download  EKG:  EKG is not ordered today.   Recent Labs: 10/16/2017: ALT 30; Hemoglobin 13.4; Platelets 220; TSH 0.750 10/17/2017:  Magnesium 2.1 10/31/2017: B Natriuretic Peptide 43.3 05/24/2018: BUN 29; Creatinine, Ser 1.11; Potassium 4.1; Sodium 137   Recent Lipid Panel    Component Value Date/Time   CHOL 141 10/14/2017 0637   TRIG 44 10/14/2017 0637   HDL 47 10/14/2017 0637   CHOLHDL 3.0 10/14/2017 0637   VLDL 9 10/14/2017 0637   LDLCALC 85 10/14/2017 0637    Physical Exam:    VS:  Ht 5\' 8"  (1.727 m)   Wt 283 lb (128.4 kg)   BMI 43.03 kg/m     Wt Readings from Last 3 Encounters:  06/04/18 283 lb (128.4 kg)  05/24/18 279 lb 3.2 oz (126.6 kg)  05/04/18 288 lb 9.6 oz (130.9 kg)     GEN:  Well nourished, well developed in no acute distress HEENT: Normal NECK: No JVD; No carotid bruits LYMPHATICS: No lymphadenopathy CARDIAC: RRR, no murmurs, rubs, gallops RESPIRATORY:  Clear to auscultation without rales, wheezing or rhonchi  ABDOMEN: Soft, non-tender, non-distended MUSCULOSKELETAL:  No edema; No deformity  SKIN: Warm and dry NEUROLOGIC:  Alert and oriented x 3 PSYCHIATRIC:  Normal affect   ASSESSMENT:    1. OSA (obstructive sleep apnea)   2. Morbid (severe) obesity due to excess calories ([place])    PLAN:    In order of problems listed above:  1.  OSA - the patient is tolerating PAP therapy well without any problems. The PAP download was reviewed today and showed an AHI of 3/hr on 11 cm H2O with 100% compliance in using more than 4 hours nightly.  The patient has been using and benefiting from PAP use and will continue to benefit from therapy.   2.  Morbid Obesity - I have encouraged him to get into a routine exercise program and cut back on carbs and portions.    Medication Adjustments/Labs and Tests Ordered: Current medicines are reviewed at length with the patient today.  Concerns regarding medicines are outlined above.  No orders of the defined types were placed in this encounter.  No orders of the defined types were placed in this encounter.   Signed, Fransico Him, MD  06/04/2018 8:14  AM    [place]

## 2018-06-29 ENCOUNTER — Telehealth (HOSPITAL_COMMUNITY): Payer: Self-pay | Admitting: Cardiology

## 2018-06-29 NOTE — Telephone Encounter (Signed)
Left message for patient to call back.  Per Adolm Joseph, CMA, ok to reschedule pt's Echo and f/u with Dr. Aundra Dubin.  If pt is having problems, we can schedule telehealth/telephone visit.

## 2018-07-06 ENCOUNTER — Encounter (HOSPITAL_COMMUNITY): Payer: PRIVATE HEALTH INSURANCE | Admitting: Cardiology

## 2018-07-06 ENCOUNTER — Ambulatory Visit (HOSPITAL_COMMUNITY): Payer: Medicare Other

## 2018-08-23 ENCOUNTER — Other Ambulatory Visit (HOSPITAL_COMMUNITY): Payer: Self-pay | Admitting: Cardiology

## 2018-10-13 ENCOUNTER — Other Ambulatory Visit (HOSPITAL_COMMUNITY): Payer: Self-pay | Admitting: Adult Health

## 2018-11-17 ENCOUNTER — Other Ambulatory Visit (HOSPITAL_COMMUNITY): Payer: Self-pay | Admitting: Cardiology

## 2018-12-18 ENCOUNTER — Other Ambulatory Visit (HOSPITAL_COMMUNITY): Payer: Self-pay | Admitting: Cardiology

## 2019-01-25 ENCOUNTER — Telehealth (HOSPITAL_COMMUNITY): Payer: Self-pay

## 2019-01-25 NOTE — Telephone Encounter (Signed)
Attempted to contact Maudie Mercury (PT's friend) to schedule an appt for an Echo and an appt for Dr. Aundra Dubin [facility]).  No answer/ left v/m asking Kim to contact our office to schedule appts.

## 2019-02-11 ENCOUNTER — Other Ambulatory Visit: Payer: Self-pay

## 2019-02-11 ENCOUNTER — Ambulatory Visit (HOSPITAL_COMMUNITY)
Admission: RE | Admit: 2019-02-11 | Discharge: 2019-02-11 | Disposition: A | Discharge status: Home / Self Care | Payer: Medicare Other | Source: Ambulatory Visit | Attending: Cardiology | Admitting: Cardiology

## 2019-02-11 ENCOUNTER — Ambulatory Visit (HOSPITAL_BASED_OUTPATIENT_CLINIC_OR_DEPARTMENT_OTHER)
Admission: RE | Admit: 2019-02-11 | Discharge: 2019-02-11 | Disposition: A | Discharge status: Home / Self Care | Payer: Medicare Other | Source: Ambulatory Visit | Attending: Internal Medicine | Admitting: Internal Medicine

## 2019-02-11 ENCOUNTER — Encounter (HOSPITAL_COMMUNITY): Payer: Self-pay | Admitting: Cardiology

## 2019-02-11 VITALS — BP 119/59 | HR 61 | Wt 279.8 lb

## 2019-02-11 DIAGNOSIS — I5032 Chronic diastolic (congestive) heart failure: Secondary | ICD-10-CM

## 2019-02-11 DIAGNOSIS — G4733 Obstructive sleep apnea (adult) (pediatric): Secondary | ICD-10-CM | POA: Insufficient documentation

## 2019-02-11 DIAGNOSIS — K7689 Other specified diseases of liver: Secondary | ICD-10-CM | POA: Diagnosis not present

## 2019-02-11 DIAGNOSIS — I428 Other cardiomyopathies: Secondary | ICD-10-CM | POA: Diagnosis not present

## 2019-02-11 DIAGNOSIS — I7121 Aneurysm of the ascending aorta, without rupture: Secondary | ICD-10-CM

## 2019-02-11 DIAGNOSIS — Z7982 Long term (current) use of aspirin: Secondary | ICD-10-CM | POA: Diagnosis not present

## 2019-02-11 DIAGNOSIS — K7460 Unspecified cirrhosis of liver: Secondary | ICD-10-CM | POA: Diagnosis not present

## 2019-02-11 DIAGNOSIS — Z8249 Family history of ischemic heart disease and other diseases of the circulatory system: Secondary | ICD-10-CM | POA: Diagnosis not present

## 2019-02-11 DIAGNOSIS — Z79899 Other long term (current) drug therapy: Secondary | ICD-10-CM | POA: Diagnosis not present

## 2019-02-11 DIAGNOSIS — I712 Thoracic aortic aneurysm, without rupture: Secondary | ICD-10-CM

## 2019-02-11 DIAGNOSIS — Z801 Family history of malignant neoplasm of trachea, bronchus and lung: Secondary | ICD-10-CM | POA: Diagnosis not present

## 2019-02-11 DIAGNOSIS — Z87891 Personal history of nicotine dependence: Secondary | ICD-10-CM | POA: Diagnosis not present

## 2019-02-11 DIAGNOSIS — I5022 Chronic systolic (congestive) heart failure: Secondary | ICD-10-CM | POA: Diagnosis not present

## 2019-02-11 DIAGNOSIS — I5042 Chronic combined systolic (congestive) and diastolic (congestive) heart failure: Secondary | ICD-10-CM

## 2019-02-11 DIAGNOSIS — R490 Dysphonia: Secondary | ICD-10-CM | POA: Diagnosis not present

## 2019-02-11 LAB — BASIC METABOLIC PANEL
Anion gap: 11 (ref 5–15)
BUN: 15 mg/dL (ref 8–23)
CO2: 22 mmol/L (ref 22–32)
Calcium: 9.1 mg/dL (ref 8.9–10.3)
Chloride: 104 mmol/L (ref 98–111)
Creatinine, Ser: 0.9 mg/dL (ref 0.61–1.24)
GFR calc Af Amer: >60 mL/min (ref >60)
GFR calc non Af Amer: >60 mL/min (ref >60)
Glucose, Bld: 85 mg/dL (ref 70–99)
Potassium: 4.3 mmol/L (ref 3.5–5.1)
Sodium: 137 mmol/L (ref 135–145)

## 2019-02-11 MED ORDER — CARVEDILOL 6.25 MG PO TABS: 6.2500 mg | ORAL_TABLET | Freq: Two times a day (BID) | ORAL | 11 refills | Status: DC

## 2019-02-11 NOTE — Progress Notes (Signed)
  Echocardiogram 2D Echocardiogram has been performed.  David Castaneda 02/11/2019, 1:47 PM

## 2019-02-11 NOTE — Patient Instructions (Signed)
INCREASE Coreg to 6.25mg  (1 tab) twice a day  You have been referred Dr Lucia Gaskins of [facility].  His office will call you to schedule this appointment  You have been ordered for MR Angiogram of your chest. You will get a call from radiology to schedule your imaging  Labs today We will only contact you if something comes back abnormal or we need to make some changes. Otherwise no news is good news!  Your physician recommends that you schedule a follow-up appointment in: 6 months with Dr Aundra Dubin.  We will call you to schedule this appointment.   At the [facility], you and your health needs are our priority. As part of our continuing mission to provide you with exceptional heart care, we have created designated Provider Care Teams. These Care Teams include your primary Cardiologist (physician) and Advanced Practice Providers (APPs- Physician Assistants and Nurse Practitioners) who all work together to provide you with the care you need, when you need it.   You may see any of the following providers on your designated Care Team at your next follow up: Marland Kitchen Dr Glori Bickers . Dr Loralie Champagne . Darrick Grinder, NP . Lyda Jester, PA   Please be sure to bring in all your medications bottles to every appointment.

## 2019-02-12 NOTE — Progress Notes (Signed)
PCP: Patient, No Pcp Per Primary Cardiologist: Dr Aundra Dubin   HPI: [name] is a 68 y.o. with history of NICM, smoking, cirrhosis, and systolic heart failure.   Admitted 10/13/2017 with increase SOB/CP. HF team consulted. RHC/LHC as noted below with normal cors and adequate cardiac output. SBP was soft so no BB was added. Placed on low dose losartn and spiro. Discharge weight 260 pounds.   Echo in 11/19 showed EF up to 35%, diffuse hypokinesis.  Echo was done today and reviewed, EF up to 45-50% with normal RV.   Today he returns for followup of CHF.  He is using CPAP.  No significant exertional dyspnea.  No chest pain.  No orthopnea/PND.  Weight is stable.  Main complaint is hoarseness.  He has been hoarse for 5 wks.  No congestion/fever, no cough, no GERD symptoms and he takes Pepcid.  He is a prior smoker. No dysphagia.     Labs (7/19): BNP 43, K 4.1, creatinine 0.91 Labs (11/19): K 4.2, creatinine 0.82 Labs (2/20): K 4.1, creatinine 1.11  PMH: 1. Chronic systolic CHF: Nonischemic cardiomyopathy.  - LHC/RHC (7/19): No significant coronary disease.  Mean RA 13, PA 44/19, mean PCWP 31, CI 3.48.  - Cardiac MRI (7/19): EF 18%, mild-moderately decreased RV systolic function, ascending aorta 4.4 cm.  - Echo (11/19): EF 35%, diffuse hypokinesis, mild LVH, aortic root 4.3 cm, normal RV size and systolic function.  - Echo (11/20): EF 45-50%, mild LV dilation, normal RV size and systolic function.  2. Ascending aortic aneurysm: 4.4 cm on cardiac MRI 7/19.  3. OSA: Severe on 8/19 sleep study. Uses CPAP.  4. Cirrhosis: Viral hepatitis labs negative, no history of heavy ETOH.  5. Nephrolithiasis.   ROS: All systems negative except as listed in HPI, PMH and Problem List.  Social History   Socioeconomic History  . Marital status: Divorced    Spouse name: Not on file  . Number of children: Not on file  . Years of education: Not on file  . Highest education level: Not on file  Occupational  History  . Not on file  Social Needs  . Financial resource strain: Not on file  . Food insecurity    Worry: Not on file    Inability: Not on file  . Transportation needs    Medical: Not on file    Non-medical: Not on file  Tobacco Use  . Smoking status: Former Research scientist (life sciences)  . Smokeless tobacco: Never Used  Substance and Sexual Activity  . Alcohol use: Not Currently  . Drug use: Never  . Sexual activity: Not on file  Lifestyle  . Physical activity    Days per week: Not on file    Minutes per session: Not on file  . Stress: Not on file  Relationships  . Social Herbalist on phone: Not on file    Gets together: Not on file    Attends religious service: Not on file    Active member of club or organization: Not on file    Attends meetings of clubs or organizations: Not on file    Relationship status: Not on file  . Intimate partner violence    Fear of current or ex partner: Not on file    Emotionally abused: Not on file    Physically abused: Not on file    Forced sexual activity: Not on file  Other Topics Concern  . Not on file  Social History Narrative  . Not  on file    Family History  Problem Relation Age of Onset  . Dementia Mother   . Heart disease Father   . Lung cancer Brother        SCLC    Past Medical History:  Diagnosis Date  . Former smoker   . Kidney stone   . OSA (obstructive sleep apnea) 06/04/2018   Severe obstructive sleep apnea with an AHI of 93/h with oxygen desaturations as low as 81%. Now on CPAP at 11 cm H2O.   . Sucking chest wound     Current Outpatient Medications  Medication Sig Dispense Refill  . acetaminophen (TYLENOL) 500 MG tablet Take 500 mg by mouth every 6 (six) hours as needed.    Marland Kitchen aspirin 81 MG chewable tablet Chew 81 mg by mouth 2 (two) times daily.     . carvedilol (COREG) 6.25 MG tablet Take 1 tablet (6.25 mg total) by mouth 2 (two) times daily. 60 tablet 11  . ENTRESTO 24-26 MG TAKE 1 TABLET BY MOUTH TWICE A DAY 60  tablet 6  . famotidine (PEPCID) 20 MG tablet Take 1 tablet (20 mg total) by mouth at bedtime. 30 tablet 0  . furosemide (LASIX) 40 MG tablet TAKE 1.5 TABLET BY MOUTH EVERY MORNING AND 1 TABLET EVERY EVENING. 80 tablet 6  . spironolactone (ALDACTONE) 25 MG tablet TAKE 1 TABLET BY MOUTH EVERY DAY 30 tablet 6   No current facility-administered medications for this encounter.     Vitals:   02/11/19 1411  BP: (!) 119/59  Pulse: 61  SpO2: 97%  Weight: 126.9 kg (279 lb 12.8 oz)   Wt Readings from Last 3 Encounters:  02/11/19 126.9 kg (279 lb 12.8 oz)  06/04/18 128.4 kg (283 lb)  05/24/18 126.6 kg (279 lb 3.2 oz)     PHYSICAL EXAM: General: NAD Neck: No JVD, no thyromegaly or thyroid nodule.  Lungs: Clear to auscultation bilaterally with normal respiratory effort. CV: Nondisplaced PMI.  Heart regular S1/S2, no S3/S4, no murmur.  No peripheral edema.  No carotid bruit.  Normal pedal pulses.  Abdomen: Soft, nontender, no hepatosplenomegaly, no distention.  Skin: Intact without lesions or rashes.  Neurologic: Alert and oriented x 3.  Psych: Normal affect. Extremities: No clubbing or cyanosis.  HEENT: Normal.   ASSESSMENT & PLAN: 1. Chronic systolic CHF: Echo [id number] with EF 15-20%. Nonischemic cardiomyopathy, no significant CAD on coronary angiography in 7/19. No strong family history of cardiomyopathy.  No ETOH or drug abuse. Cardiac MRI with EF 18%, no LGE (7/19). RHC (7/19) with preserved cardiac output and elevated filling pressures. Possible viral myocarditis.  Echo in 11/19 showed EF up to 35%.  Echo was done today and reviewed, EF now 45-50%.  Narrow QRS.  NYHA class I-II symptoms.  He is not volume overloaded on exam with stable weight.    - Continue Lasix 60 qam/40 qpm. BMET today.    - Continue spironolactone 25 mg daily.  - Continue Entresto 24/26 bid.  - Increase Coreg to 6.25 mg bid.    - EF is now out of ICD range.  2. Cirrhosis: Noted on CTA. Viral hepatitis labs  negative.  No history of heavy ETOH. Liver MRI showed cirrhosis and liver cysts.   3. OSA: Severe.  Using CPAP.   4. Ascending aortic aneurysm: I will arrange for MRA chest to followup on this.  5. Hoarseness: x 5 weeks, no GERD-like symptoms, no dysphagia, no upper respiratory symptoms.  He is a prior  smoker.  Worst case scenario would be laryngeal cancer, this needs to be rule out.  - I will arrange for ENT appointment with Dr. Lucia Gaskins.   Followup 6 months.   Loralie Champagne  02/12/2019

## 2019-02-15 ENCOUNTER — Telehealth (HOSPITAL_COMMUNITY): Payer: Self-pay | Admitting: Vascular Surgery

## 2019-02-15 NOTE — Telephone Encounter (Signed)
Left pt message giving appt date and time for MRA of chest 03/01/19 @ 11am, asked pt to call back to confirm

## 2019-03-01 ENCOUNTER — Other Ambulatory Visit: Payer: Self-pay

## 2019-03-01 ENCOUNTER — Ambulatory Visit (HOSPITAL_COMMUNITY)
Admission: RE | Admit: 2019-03-01 | Discharge: 2019-03-01 | Disposition: A | Discharge status: Home / Self Care | Payer: Medicare Other | Source: Ambulatory Visit | Attending: Cardiology | Admitting: Cardiology

## 2019-03-01 DIAGNOSIS — I712 Thoracic aortic aneurysm, without rupture: Secondary | ICD-10-CM | POA: Insufficient documentation

## 2019-03-01 DIAGNOSIS — I7121 Aneurysm of the ascending aorta, without rupture: Secondary | ICD-10-CM

## 2019-03-01 MED ORDER — GADOBUTROL 1 MMOL/ML IV SOLN
12.0000 mL | Freq: Once | INTRAVENOUS | Status: AC | PRN
  Administered 2019-03-01: 12 mL via INTRAVENOUS

## 2019-03-05 ENCOUNTER — Telehealth (HOSPITAL_COMMUNITY): Payer: Self-pay

## 2019-03-05 NOTE — Telephone Encounter (Signed)
Pt aware of results, verbalized understanding

## 2019-03-05 NOTE — Telephone Encounter (Signed)
-----   Message from Larey Dresser, MD sent at 03/04/2019  1:12 PM EST ----- 4.4 cm aortic root, stable, repeat MRA in 1 year.

## 2019-03-08 ENCOUNTER — Other Ambulatory Visit: Payer: Self-pay

## 2019-03-08 ENCOUNTER — Encounter (INDEPENDENT_AMBULATORY_CARE_PROVIDER_SITE_OTHER): Payer: Self-pay | Admitting: Otolaryngology

## 2019-03-08 ENCOUNTER — Ambulatory Visit (INDEPENDENT_AMBULATORY_CARE_PROVIDER_SITE_OTHER): Payer: Medicare Other | Admitting: Otolaryngology

## 2019-03-08 VITALS — Temp 97.5°F

## 2019-03-08 DIAGNOSIS — R490 Dysphonia: Secondary | ICD-10-CM

## 2019-03-08 DIAGNOSIS — H6122 Impacted cerumen, left ear: Secondary | ICD-10-CM | POA: Diagnosis not present

## 2019-03-08 NOTE — Progress Notes (Signed)
HPI: David Castaneda is a 68 y.o. male who presents is referred by Dr. Aundra Dubin for evaluation of hoarseness.  He is followed by Dr. Aundra Dubin for congestive heart failure.  He presents today in the office with a friend.  He has been hoarse now for over 2 months.  He denies sore throat.  He has had some left ear discomfort. He apparently quit smoking over 2 years ago. Has history of severe obstructive sleep apnea on CPAP.Marland Kitchen  Past Medical History:  Diagnosis Date  . Former smoker   . Kidney stone   . OSA (obstructive sleep apnea) 06/04/2018   Severe obstructive sleep apnea with an AHI of 93/h with oxygen desaturations as low as 81%. Now on CPAP at 11 cm H2O.   . Sucking chest wound    Past Surgical History:  Procedure Laterality Date  . wound     wound in back in war   Social History   Socioeconomic History  . Marital status: Divorced    Spouse name: Not on file  . Number of children: Not on file  . Years of education: Not on file  . Highest education level: Not on file  Occupational History  . Not on file  Social Needs  . Financial resource strain: Not on file  . Food insecurity    Worry: Not on file    Inability: Not on file  . Transportation needs    Medical: Not on file    Non-medical: Not on file  Tobacco Use  . Smoking status: Former Smoker    Packs/day: 1.00    Years: 50.00    Pack years: 50.00    Start date: 1968    Quit date: 2018    Years since quitting: 2.9  . Smokeless tobacco: Never Used  Substance and Sexual Activity  . Alcohol use: Not Currently  . Drug use: Never  . Sexual activity: Not on file  Lifestyle  . Physical activity    Days per week: Not on file    Minutes per session: Not on file  . Stress: Not on file  Relationships  . Social Herbalist on phone: Not on file    Gets together: Not on file    Attends religious service: Not on file    Active member of club or organization: Not on file    Attends meetings of clubs or organizations: Not  on file    Relationship status: Not on file  Other Topics Concern  . Not on file  Social History Narrative  . Not on file   Family History  Problem Relation Age of Onset  . Dementia Mother   . Heart disease Father   . Lung cancer Brother        SCLC   No Known Allergies Prior to Admission medications   Medication Sig Start Date End Date Taking? Authorizing Provider  acetaminophen (TYLENOL) 500 MG tablet Take 500 mg by mouth every 6 (six) hours as needed.   Yes [provider]  aspirin 81 MG chewable tablet Chew 81 mg by mouth 2 (two) times daily.    Yes [provider]  carvedilol (COREG) 6.25 MG tablet Take 1 tablet (6.25 mg total) by mouth 2 (two) times daily. 02/11/19 02/11/20 Yes Larey Dresser, MD  ENTRESTO 24-26 MG TAKE 1 TABLET BY MOUTH TWICE A DAY 11/19/18  Yes Larey Dresser, MD  famotidine (PEPCID) 20 MG tablet Take 1 tablet (20 mg total) by mouth  at bedtime. 10/19/17  Yes Regalado, Belkys A, MD  furosemide (LASIX) 40 MG tablet TAKE 1.5 TABLET BY MOUTH EVERY MORNING AND 1 TABLET EVERY EVENING. 12/18/18  Yes Larey Dresser, MD  spironolactone (ALDACTONE) 25 MG tablet TAKE 1 TABLET BY MOUTH EVERY DAY 08/23/18  Yes Larey Dresser, MD     Positive ROS: Otherwise negative  All other systems have been reviewed and were otherwise negative with the exception of those mentioned in the HPI and as above.  Physical Exam: Constitutional: Alert, well-appearing, obese gentleman in no acute distress.  He does have moderate hoarseness but no difficulty breathing. Ears: External ears without lesions or tenderness.  Right ear canal was clear.  Left ear canal had a large amount of wax that was removed in the office using forceps., clear TMs bilaterally. Nasal: External nose without lesions. Septum relatively midline.. Clear nasal passages.  No signs of infection. Oral: Lips and gums without lesions. Tongue and palate mucosa without lesions. Posterior oropharynx clear..   Indirect laryngoscopy was difficult because of strong gag reflex but base of tongue was clear as was the epiglottis. Fiberoptic laryngoscopy was performed through the right nostril.  Nasopharynx was clear.  Base of tongue vallecula and epiglottis were normal.  Patient had a moderate size pedunculated lesion on the left vocal cord.  Vocal cords had symmetric mobility. Neck: No palpable adenopathy or masses Respiratory: Breathing comfortably  Skin: No facial/neck lesions or rash noted.  Cerumen impaction removal  Date/Time: 03/08/2019 5:16 PM Performed by: Rozetta Nunnery, MD Authorized by: Rozetta Nunnery, MD   Consent:    Consent obtained:  Verbal   Consent given by:  Patient   Risks discussed:  Pain and bleeding Procedure details:    Location:  L ear   Procedure type: curette and forceps   Post-procedure details:    Inspection:  TM intact and canal normal   Hearing quality:  Improved   Patient tolerance of procedure:  Tolerated well, no immediate complications Laryngoscopy  Date/Time: 03/08/2019 5:16 PM Performed by: Rozetta Nunnery, MD Authorized by: Rozetta Nunnery, MD   Consent:    Consent obtained:  Verbal   Consent given by:  Patient   Risks discussed:  Pain Procedure details:    Indications: hoarseness, dysphagia, or aspiration     Medication:  Afrin   Instrument: flexible fiberoptic laryngoscope     Scope location: right nare   Sinus:    Right nasopharynx: normal   Mouth:    Oropharynx: normal     Vallecula: normal     Base of tongue: normal     Epiglottis: normal   Throat:    Pyriform sinus: normal     False vocal cords: normal     Normal: Lesion was noted on the left true vocal cord.   Comments:     Patient has a slightly pedunculated mass arising from the mid left true vocal cord.  Vocal cord appears to have adequate mobility.    Assessment: Hoarseness secondary to left vocal cord lesion.  Questionable neoplasm. Congestive heart  failure followed by Dr. Loralie Champagne Severe obstructive sleep apnea on CPAP  Plan: Patient will require microlaryngoscopy and excisional biopsy of left vocal cord lesion. This will need to be scheduled in the hospital and will need cardiac clearance.   Radene Journey, MD   CC:

## 2019-03-12 NOTE — Anesthesia Preprocedure Evaluation (Addendum)
Anesthesia Evaluation  Patient identified by MRN, date of birth, ID band Patient awake    Reviewed: Allergy & Precautions, H&P , NPO status , Patient's Chart, lab work & pertinent test results, reviewed documented beta blocker date and time   Airway Mallampati: II  TM Distance: >3 FB Neck ROM: full    Dental no notable dental hx. (+) Poor Dentition, Chipped, Missing   Pulmonary sleep apnea and Continuous Positive Airway Pressure Ventilation , former smoker,    Pulmonary exam normal breath sounds clear to auscultation       Cardiovascular Exercise Tolerance: Good +CHF   Rhythm:regular Rate:Normal     Neuro/Psych negative neurological ROS  negative psych ROS   GI/Hepatic negative GI ROS, (+) Cirrhosis       , Hepatitis -  Endo/Other  Morbid obesity  Renal/GU negative Renal ROS  negative genitourinary   Musculoskeletal   Abdominal   Peds  Hematology negative hematology ROS (+)   Anesthesia Other Findings   Reproductive/Obstetrics negative OB ROS                            Anesthesia Physical Anesthesia Plan  ASA: III  Anesthesia Plan: General   Post-op Pain Management:    Induction: Intravenous  PONV Risk Score and Plan: Ondansetron, Dexamethasone and Treatment may vary due to age or medical condition  Airway Management Planned: Oral ETT and Video Laryngoscope Planned  Additional Equipment:   Intra-op Plan:   Post-operative Plan: Extubation in OR  Informed Consent: I have reviewed the patients History and Physical, chart, labs and discussed the procedure including the risks, benefits and alternatives for the proposed anesthesia with the patient or authorized representative who has indicated his/her understanding and acceptance.     Dental Advisory Given  Plan Discussed with: CRNA, Anesthesiologist and Surgeon  Anesthesia Plan Comments: (Follows with cardiology for hx of  NICM, HFrEF (EF 45-50% by echo 11/20). Last sen by Dr. Marigene Ehlers 02/11/19, noted EF had improved and was out of ICD range. Also following thoracic aortic aneurysm, stable per MRA 03/01/19. Referred pt to ENT for eval of persistent hoarseness.   Severe OSA per sleep study 11/24/17. AHI 93.  Hx of Cirrhosis: Noted on CTA.Viral hepatitis labs negative. No history of heavy ETOH. Liver MRI showed cirrhosis and liver cysts.   Will need DOS labs, EKG, and Eval.   EKG 10/14/17: Sinus rhythm with occasional Premature ventricular complexes. Rate 99. Abnormal QRS-T angle, consider primary T wave abnormality. Prolonged QT (QTc 462).  MRA chest 03/01/19: IMPRESSION: 1. Stable dilatation of the aortic root measuring up to 4.4 cm. 2. Stable fusiform aneurysm of the ascending thoracic aorta measuring up to 4.1 cm. Recommend annual imaging followup by CTA or MRA. This recommendation follows 2010 ACCF/AHA/AATS/ACR/ASA/SCA/SCAI/SIR/STS/SVM Guidelines for the Diagnosis and Management of Patients with Thoracic Aortic Disease. Circulation. 2010; 121JN:9224643. Aortic aneurysm NOS (ICD10-I71.9) 3. Hepatic cysts.  TTE 02/11/19:  1. Mildly dilated left ventricular internal cavity size.  2. Left ventricular ejection fraction, by visual estimation, is 45 to 50%, mildly decreased. There is no left ventricular hypertrophy. GLS -20.7%, normal range.  3. The left ventricle demonstrates global hypokinesis.  4. Left ventricular diastolic parameters are consistent with Grade I diastolic dysfunction (impaired relaxation).  5. Left atrial size was normal.  6. Right atrial size was normal.  7. The aortic valve is tricuspid. Aortic valve regurgitation is not visualized. No evidence of aortic valve sclerosis or stenosis.  8. There is mild dilatation of the aortic root measuring 39 mm.  9. Global right ventricle has normal systolic function.The right ventricular size is normal. No increase in right ventricular wall  thickness. 10. The mitral valve is normal in structure. No evidence of mitral valve regurgitation. No evidence of mitral stenosis. 11. The tricuspid valve is normal in structure. Tricuspid valve regurgitation is not demonstrated. 12. TR signal is inadequate for assessing pulmonary artery systolic pressure. 13. The inferior vena cava is normal in size with greater than 50% respiratory variability, suggesting right atrial pressure of 3 mmHg.  R/L Heart Cath 10/16/17: 1. Elevated right and left heart filling pressures.   2. Pulmonary venous hypertension.  3. Preserved cardiac output.  4. No significant coronary disease, nonischemic cardiomyopathy.)       Anesthesia Quick Evaluation

## 2019-03-12 NOTE — Progress Notes (Signed)
Anesthesia Chart Review: Same day workup  Follows with cardiology for hx of NICM, HFrEF (EF 45-50% by echo 11/20). Last sen by Dr. Marigene Ehlers 02/11/19, noted EF had improved and was out of ICD range. Also following thoracic aortic aneurysm, stable per MRA 03/01/19. Referred pt to ENT for eval of persistent hoarseness.   Severe OSA per sleep study 11/24/17. AHI 93.  Hx of Cirrhosis: Noted on CTA.Viral hepatitis labs negative. No history of heavy ETOH. Liver MRI showed cirrhosis and liver cysts.   Will need DOS labs, EKG, and Eval.   EKG 10/14/17: Sinus rhythm with occasional Premature ventricular complexes. Rate 99. Abnormal QRS-T angle, consider primary T wave abnormality. Prolonged QT (QTc 462).  MRA chest 03/01/19: IMPRESSION: 1. Stable dilatation of the aortic root measuring up to 4.4 cm. 2. Stable fusiform aneurysm of the ascending thoracic aorta measuring up to 4.1 cm. Recommend annual imaging followup by CTA or MRA. This recommendation follows 2010 ACCF/AHA/AATS/ACR/ASA/SCA/SCAI/SIR/STS/SVM Guidelines for the Diagnosis and Management of Patients with Thoracic Aortic Disease. Circulation. 2010; [id number]. Aortic aneurysm NOS (ICD10-I71.9) 3. Hepatic cysts.  TTE 02/11/19:  1. Mildly dilated left ventricular internal cavity size.  2. Left ventricular ejection fraction, by visual estimation, is 45 to 50%, mildly decreased. There is no left ventricular hypertrophy. GLS -20.7%, normal range.  3. The left ventricle demonstrates global hypokinesis.  4. Left ventricular diastolic parameters are consistent with Grade I diastolic dysfunction (impaired relaxation).  5. Left atrial size was normal.  6. Right atrial size was normal.  7. The aortic valve is tricuspid. Aortic valve regurgitation is not visualized. No evidence of aortic valve sclerosis or stenosis.  8. There is mild dilatation of the aortic root measuring 39 mm.  9. Global right ventricle has normal systolic function.The right  ventricular size is normal. No increase in right ventricular wall thickness. 10. The mitral valve is normal in structure. No evidence of mitral valve regurgitation. No evidence of mitral stenosis. 11. The tricuspid valve is normal in structure. Tricuspid valve regurgitation is not demonstrated. 12. TR signal is inadequate for assessing pulmonary artery systolic pressure. 13. The inferior vena cava is normal in size with greater than 50% respiratory variability, suggesting right atrial pressure of 3 mmHg.  R/L Heart Cath 10/16/17: 1. Elevated right and left heart filling pressures.   2. Pulmonary venous hypertension.  3. Preserved cardiac output.  4. No significant coronary disease, nonischemic cardiomyopathy.

## 2019-03-13 ENCOUNTER — Other Ambulatory Visit (HOSPITAL_COMMUNITY)
Admission: RE | Admit: 2019-03-13 | Discharge: 2019-03-13 | Disposition: A | Discharge status: Home / Self Care | Payer: Medicare Other | Source: Ambulatory Visit | Attending: Otolaryngology | Admitting: Otolaryngology

## 2019-03-13 ENCOUNTER — Ambulatory Visit (INDEPENDENT_AMBULATORY_CARE_PROVIDER_SITE_OTHER): Payer: Self-pay | Admitting: Otolaryngology

## 2019-03-13 ENCOUNTER — Encounter (HOSPITAL_COMMUNITY): Payer: Self-pay | Admitting: *Deleted

## 2019-03-13 ENCOUNTER — Other Ambulatory Visit: Payer: Self-pay

## 2019-03-13 ENCOUNTER — Other Ambulatory Visit (HOSPITAL_COMMUNITY): Payer: Self-pay | Admitting: Cardiology

## 2019-03-13 DIAGNOSIS — Z01812 Encounter for preprocedural laboratory examination: Secondary | ICD-10-CM | POA: Diagnosis present

## 2019-03-13 DIAGNOSIS — Z20828 Contact with and (suspected) exposure to other viral communicable diseases: Secondary | ICD-10-CM | POA: Diagnosis not present

## 2019-03-13 DIAGNOSIS — R490 Dysphonia: Secondary | ICD-10-CM

## 2019-03-13 LAB — SARS CORONAVIRUS 2 (TAT 6-24 HRS): SARS Coronavirus 2: NEGATIVE

## 2019-03-13 NOTE — Progress Notes (Signed)
Pre-op instructions given to patients friend Maudie Mercury (who is is emergency contact) kim verbalized understanding of instructions  Dr. Aundra Dubin is patients primary Cardiologist Maudie Mercury to call his office for aspirin instructions

## 2019-03-13 NOTE — H&P (Signed)
PREOPERATIVE H&P  Chief Complaint: Hoarseness for 2 months  HPI: David Castaneda is a 68 y.o. male who presents for evaluation of hoarseness for the past 2 to 3 months.  No real pain or difficulty swallowing.  On fiberoptic laryngoscopy in the office patient has a large pedunculated lesion arising from the left true vocal cord.  He is taken to the operating room at this time for direct laryngoscopy and biopsy.  Past Medical History:  Diagnosis Date  . Former smoker   . Kidney stone   . OSA (obstructive sleep apnea) 06/04/2018   Severe obstructive sleep apnea with an AHI of 93/h with oxygen desaturations as low as 81%. Now on CPAP at 11 cm H2O.   . Sucking chest wound    Past Surgical History:  Procedure Laterality Date  . wound     wound in back in war   Social History   Socioeconomic History  . Marital status: Divorced    Spouse name: Not on file  . Number of children: Not on file  . Years of education: Not on file  . Highest education level: Not on file  Occupational History  . Not on file  Social Needs  . Financial resource strain: Not on file  . Food insecurity    Worry: Not on file    Inability: Not on file  . Transportation needs    Medical: Not on file    Non-medical: Not on file  Tobacco Use  . Smoking status: Former Smoker    Packs/day: 1.00    Years: 50.00    Pack years: 50.00    Start date: 1968    Quit date: 2018    Years since quitting: 2.9  . Smokeless tobacco: Never Used  Substance and Sexual Activity  . Alcohol use: Not Currently  . Drug use: Never  . Sexual activity: Not on file  Lifestyle  . Physical activity    Days per week: Not on file    Minutes per session: Not on file  . Stress: Not on file  Relationships  . Social Herbalist on phone: Not on file    Gets together: Not on file    Attends religious service: Not on file    Active member of club or organization: Not on file    Attends meetings of clubs or organizations: Not on  file    Relationship status: Not on file  Other Topics Concern  . Not on file  Social History Narrative  . Not on file   Family History  Problem Relation Age of Onset  . Dementia Mother   . Heart disease Father   . Lung cancer Brother        SCLC   No Known Allergies Prior to Admission medications   Medication Sig Start Date End Date Taking? Authorizing Provider  acetaminophen (TYLENOL) 500 MG tablet Take 500 mg by mouth every 6 (six) hours as needed for moderate pain or headache.    Yes [provider]  aspirin 81 MG chewable tablet Chew 81 mg by mouth 2 (two) times daily.   Yes [provider]  carvedilol (COREG) 6.25 MG tablet Take 1 tablet (6.25 mg total) by mouth 2 (two) times daily. 02/11/19 02/11/20 Yes Larey Dresser, MD  ENTRESTO 24-26 MG TAKE 1 TABLET BY MOUTH TWICE A DAY Patient taking differently: Take 1 tablet by mouth 2 (two) times daily.  11/19/18  Yes Larey Dresser, MD  famotidine (  PEPCID) 20 MG tablet Take 1 tablet (20 mg total) by mouth at bedtime. Patient taking differently: Take 20 mg by mouth 2 (two) times daily.  10/19/17  Yes Regalado, Belkys A, MD  furosemide (LASIX) 40 MG tablet TAKE 1.5 TABLET BY MOUTH EVERY MORNING AND 1 TABLET EVERY EVENING. Patient taking differently: Take 40-60 mg by mouth See admin instructions. Take 60 mg in the morning and 40 mg at night 12/18/18  Yes Larey Dresser, MD  oxymetazoline (AFRIN) 0.05 % nasal spray Place 1 spray into both nostrils 2 (two) times daily as needed for congestion.   Yes [provider]  spironolactone (ALDACTONE) 25 MG tablet TAKE 1 TABLET BY MOUTH EVERY DAY Patient taking differently: Take 25 mg by mouth daily.  08/23/18  Yes Larey Dresser, MD     Positive ROS: Otherwise negative  All other systems have been reviewed and were otherwise negative with the exception of those mentioned in the HPI and as above.  Physical Exam: There were no vitals filed for this  visit.  General: Alert, no acute distress Oral: Normal oral mucosa and tonsils Fiberoptic laryngoscopy reveals a large left vocal cord pedunculated lesion.  Vocal cords with symmetric mobility. Nasal: Clear nasal passages Neck: No palpable adenopathy or thyroid nodules Ear: Ear canal is clear with normal appearing TMs Cardiovascular: Regular rate and rhythm, no murmur.  Respiratory: Clear to auscultation Neurologic: Alert and oriented x 3   Assessment/Plan: HOARSENESS Plan for Procedure(s): MICROLARYNGOSCOPY WITH POSSIBLE CO2 LASER AND EXCISION OF VOCAL CORD LESION   Melony Overly, MD 03/13/2019 1:24 PM

## 2019-03-14 ENCOUNTER — Telehealth (HOSPITAL_COMMUNITY): Payer: Self-pay | Admitting: *Deleted

## 2019-03-14 MED ORDER — DEXTROSE 5 % IV SOLN
3.0000 g | INTRAVENOUS | Status: AC
  Administered 2019-03-15: 3 g via INTRAVENOUS
  Filled 2019-03-14: qty 3000
  Filled 2019-03-14: qty 3

## 2019-03-14 NOTE — Telephone Encounter (Signed)
David Castaneda, called concerned about pt's meds.  She states he is sch for surgery tomorrow for something on his vocal chords.  They told her to check with [place] about what to do with his ASA.    Per David Jester, PA pt can hold tomorrows dose of ASA and then restart Sat.   David Castaneda is aware and agreeable, she states pt has to be at hospital at 5:30 AM so she may just wait and hold all of his meds tomorrow morning.  She is thankful for call back.

## 2019-03-15 ENCOUNTER — Encounter (HOSPITAL_COMMUNITY)
Admission: RE | Disposition: A | Discharge status: Home / Self Care | Payer: Self-pay | Source: Home / Self Care | Attending: Otolaryngology

## 2019-03-15 ENCOUNTER — Ambulatory Visit (HOSPITAL_COMMUNITY): Payer: Medicare Other | Admitting: Physician Assistant

## 2019-03-15 ENCOUNTER — Ambulatory Visit (HOSPITAL_COMMUNITY)
Admission: RE | Admit: 2019-03-15 | Discharge: 2019-03-15 | Disposition: A | Discharge status: Home / Self Care | Payer: Medicare Other | Attending: Otolaryngology | Admitting: Otolaryngology

## 2019-03-15 ENCOUNTER — Other Ambulatory Visit: Payer: Self-pay

## 2019-03-15 ENCOUNTER — Encounter (HOSPITAL_COMMUNITY): Payer: Self-pay | Admitting: Otolaryngology

## 2019-03-15 DIAGNOSIS — I110 Hypertensive heart disease with heart failure: Secondary | ICD-10-CM | POA: Diagnosis not present

## 2019-03-15 DIAGNOSIS — C320 Malignant neoplasm of glottis: Secondary | ICD-10-CM | POA: Insufficient documentation

## 2019-03-15 DIAGNOSIS — Z820 Family history of epilepsy and other diseases of the nervous system: Secondary | ICD-10-CM | POA: Diagnosis not present

## 2019-03-15 DIAGNOSIS — I712 Thoracic aortic aneurysm, without rupture: Secondary | ICD-10-CM | POA: Diagnosis not present

## 2019-03-15 DIAGNOSIS — I509 Heart failure, unspecified: Secondary | ICD-10-CM | POA: Insufficient documentation

## 2019-03-15 DIAGNOSIS — K7689 Other specified diseases of liver: Secondary | ICD-10-CM | POA: Insufficient documentation

## 2019-03-15 DIAGNOSIS — Z87442 Personal history of urinary calculi: Secondary | ICD-10-CM | POA: Diagnosis not present

## 2019-03-15 DIAGNOSIS — Z79899 Other long term (current) drug therapy: Secondary | ICD-10-CM | POA: Diagnosis not present

## 2019-03-15 DIAGNOSIS — Z8249 Family history of ischemic heart disease and other diseases of the circulatory system: Secondary | ICD-10-CM | POA: Insufficient documentation

## 2019-03-15 DIAGNOSIS — Z801 Family history of malignant neoplasm of trachea, bronchus and lung: Secondary | ICD-10-CM | POA: Insufficient documentation

## 2019-03-15 DIAGNOSIS — Z6841 Body Mass Index (BMI) 40.0 and over, adult: Secondary | ICD-10-CM | POA: Insufficient documentation

## 2019-03-15 DIAGNOSIS — K7460 Unspecified cirrhosis of liver: Secondary | ICD-10-CM | POA: Diagnosis not present

## 2019-03-15 DIAGNOSIS — I87009 Postthrombotic syndrome without complications of unspecified extremity: Secondary | ICD-10-CM | POA: Diagnosis not present

## 2019-03-15 DIAGNOSIS — R490 Dysphonia: Secondary | ICD-10-CM | POA: Insufficient documentation

## 2019-03-15 DIAGNOSIS — Z87891 Personal history of nicotine dependence: Secondary | ICD-10-CM | POA: Diagnosis not present

## 2019-03-15 DIAGNOSIS — Z7982 Long term (current) use of aspirin: Secondary | ICD-10-CM | POA: Diagnosis not present

## 2019-03-15 DIAGNOSIS — G4733 Obstructive sleep apnea (adult) (pediatric): Secondary | ICD-10-CM | POA: Diagnosis not present

## 2019-03-15 HISTORY — PX: MICROLARYNGOSCOPY WITH CO2 LASER AND EXCISION OF VOCAL CORD LESION: SHX5970

## 2019-03-15 HISTORY — DX: Personal history of urinary calculi: Z87.442

## 2019-03-15 HISTORY — DX: Heart failure, unspecified: I50.9

## 2019-03-15 LAB — CBC
HCT: 39.1 % (ref 39.0–52.0)
Hemoglobin: 13.1 g/dL (ref 13.0–17.0)
MCH: 30.4 pg (ref 26.0–34.0)
MCHC: 33.5 g/dL (ref 30.0–36.0)
MCV: 90.7 fL (ref 80.0–100.0)
Platelets: 220 10*3/uL (ref 150–400)
RBC: 4.31 MIL/uL (ref 4.22–5.81)
RDW: 13.1 % (ref 11.5–15.5)
WBC: 7.2 10*3/uL (ref 4.0–10.5)
nRBC: 0 % (ref 0.0–0.2)

## 2019-03-15 LAB — BASIC METABOLIC PANEL
Anion gap: 10 (ref 5–15)
BUN: 9 mg/dL (ref 8–23)
CO2: 23 mmol/L (ref 22–32)
Calcium: 9.1 mg/dL (ref 8.9–10.3)
Chloride: 105 mmol/L (ref 98–111)
Creatinine, Ser: 0.89 mg/dL (ref 0.61–1.24)
GFR calc Af Amer: >60 mL/min (ref >60)
GFR calc non Af Amer: >60 mL/min (ref >60)
Glucose, Bld: 119 mg/dL — ABNORMAL HIGH (ref 70–99)
Potassium: 3.8 mmol/L (ref 3.5–5.1)
Sodium: 138 mmol/L (ref 135–145)

## 2019-03-15 SURGERY — MICROLARYNGOSCOPY WITH CO2 LASER AND EXCISION OF VOCAL CORD LESION
Anesthesia: General

## 2019-03-15 MED ORDER — PROPOFOL 10 MG/ML IV BOLUS
INTRAVENOUS | Status: DC | PRN
  Administered 2019-03-15: 150 mg via INTRAVENOUS

## 2019-03-15 MED ORDER — PROPOFOL 10 MG/ML IV BOLUS
INTRAVENOUS | Status: AC
  Filled 2019-03-15: qty 40

## 2019-03-15 MED ORDER — EPHEDRINE SULFATE-NACL 50-0.9 MG/10ML-% IV SOSY
PREFILLED_SYRINGE | INTRAVENOUS | Status: DC | PRN
  Administered 2019-03-15: 10 mg via INTRAVENOUS

## 2019-03-15 MED ORDER — FENTANYL CITRATE (PF) 250 MCG/5ML IJ SOLN
INTRAMUSCULAR | Status: AC
  Filled 2019-03-15: qty 5

## 2019-03-15 MED ORDER — 0.9 % SODIUM CHLORIDE (POUR BTL) OPTIME
TOPICAL | Status: DC | PRN
  Administered 2019-03-15: 1000 mL

## 2019-03-15 MED ORDER — DEXAMETHASONE SODIUM PHOSPHATE 10 MG/ML IJ SOLN
INTRAMUSCULAR | Status: DC | PRN
  Administered 2019-03-15: 10 mg via INTRAVENOUS

## 2019-03-15 MED ORDER — LACTATED RINGERS IV SOLN
INTRAVENOUS | Status: DC | PRN
  Administered 2019-03-15: 10:00:00 via INTRAVENOUS

## 2019-03-15 MED ORDER — MIDAZOLAM HCL 2 MG/2ML IJ SOLN
INTRAMUSCULAR | Status: AC
  Filled 2019-03-15: qty 2

## 2019-03-15 MED ORDER — ROCURONIUM BROMIDE 50 MG/5ML IV SOSY
PREFILLED_SYRINGE | INTRAVENOUS | Status: DC | PRN
  Administered 2019-03-15: 40 mg via INTRAVENOUS
  Administered 2019-03-15: 60 mg via INTRAVENOUS
  Administered 2019-03-15: 40 mg via INTRAVENOUS

## 2019-03-15 MED ORDER — MIDAZOLAM HCL 5 MG/5ML IJ SOLN
INTRAMUSCULAR | Status: DC | PRN
  Administered 2019-03-15: 2 mg via INTRAVENOUS

## 2019-03-15 MED ORDER — CHLORHEXIDINE GLUCONATE CLOTH 2 % EX PADS: 6.0000 | MEDICATED_PAD | Freq: Once | CUTANEOUS | Status: DC

## 2019-03-15 MED ORDER — ONDANSETRON HCL 4 MG/2ML IJ SOLN
INTRAMUSCULAR | Status: AC
  Filled 2019-03-15: qty 2

## 2019-03-15 MED ORDER — ROCURONIUM BROMIDE 10 MG/ML (PF) SYRINGE
PREFILLED_SYRINGE | INTRAVENOUS | Status: AC
  Filled 2019-03-15: qty 10

## 2019-03-15 MED ORDER — LIDOCAINE 2% (20 MG/ML) 5 ML SYRINGE
INTRAMUSCULAR | Status: DC | PRN
  Administered 2019-03-15: 25 mg via INTRAVENOUS
  Administered 2019-03-15: 80 mg via INTRAVENOUS

## 2019-03-15 MED ORDER — DEXAMETHASONE SODIUM PHOSPHATE 10 MG/ML IJ SOLN
INTRAMUSCULAR | Status: AC
  Filled 2019-03-15: qty 1

## 2019-03-15 MED ORDER — EPINEPHRINE HCL (NASAL) 0.1 % NA SOLN
NASAL | Status: AC
  Filled 2019-03-15: qty 30

## 2019-03-15 MED ORDER — LIDOCAINE 2% (20 MG/ML) 5 ML SYRINGE
INTRAMUSCULAR | Status: AC
  Filled 2019-03-15: qty 5

## 2019-03-15 MED ORDER — SUGAMMADEX SODIUM 200 MG/2ML IV SOLN
INTRAVENOUS | Status: DC | PRN
  Administered 2019-03-15: 300 mg via INTRAVENOUS

## 2019-03-15 MED ORDER — PHENYLEPHRINE 40 MCG/ML (10ML) SYRINGE FOR IV PUSH (FOR BLOOD PRESSURE SUPPORT)
PREFILLED_SYRINGE | INTRAVENOUS | Status: DC | PRN
  Administered 2019-03-15 (×4): 80 ug via INTRAVENOUS

## 2019-03-15 MED ORDER — ONDANSETRON HCL 4 MG/2ML IJ SOLN
INTRAMUSCULAR | Status: DC | PRN
  Administered 2019-03-15: 4 mg via INTRAVENOUS

## 2019-03-15 MED ORDER — EPINEPHRINE PF 1 MG/ML IJ SOLN
INTRAMUSCULAR | Status: DC | PRN
  Administered 2019-03-15: 30 mL

## 2019-03-15 MED ORDER — FENTANYL CITRATE (PF) 100 MCG/2ML IJ SOLN
INTRAMUSCULAR | Status: DC | PRN
  Administered 2019-03-15: 100 ug via INTRAVENOUS

## 2019-03-15 SURGICAL SUPPLY — 30 items
BLADE SURG 15 STRL LF DISP TIS (BLADE) ×1 IMPLANT
BLADE SURG 15 STRL SS (BLADE) ×2
BNDG EYE OVAL (GAUZE/BANDAGES/DRESSINGS) IMPLANT
CANISTER SUCT 1200ML W/VALVE (MISCELLANEOUS) ×3 IMPLANT
CONT SPEC 4OZ CLIKSEAL STRL BL (MISCELLANEOUS) ×6 IMPLANT
COVER WAND RF STERILE (DRAPES) IMPLANT
DEPRESSOR TONGUE BLADE STERILE (MISCELLANEOUS) ×3 IMPLANT
DRAPE HALF SHEET 40X57 (DRAPES) ×3 IMPLANT
FILTER 7/8 IN (FILTER) IMPLANT
GAUZE SPONGE 4X4 12PLY STRL LF (GAUZE/BANDAGES/DRESSINGS) IMPLANT
GAUZE SPONGE 4X4 16PLY XRAY LF (GAUZE/BANDAGES/DRESSINGS) ×3 IMPLANT
GLOVE SS BIOGEL STRL SZ 7.5 (GLOVE) ×1 IMPLANT
GLOVE SUPERSENSE BIOGEL SZ 7.5 (GLOVE) ×2
GOWN STRL REUS W/ TWL LRG LVL3 (GOWN DISPOSABLE) ×1 IMPLANT
GOWN STRL REUS W/TWL LRG LVL3 (GOWN DISPOSABLE) ×2
GUARD TEETH (MISCELLANEOUS) IMPLANT
KIT BASIN OR (CUSTOM PROCEDURE TRAY) ×3 IMPLANT
MARKER SKIN DUAL TIP RULER LAB (MISCELLANEOUS) ×3 IMPLANT
NEEDLE HYPO 25GX1X1/2 BEV (NEEDLE) ×3 IMPLANT
NEEDLE SPNL 22GX3.5 QUINCKE BK (NEEDLE) ×3 IMPLANT
NS IRRIG 1000ML POUR BTL (IV SOLUTION) ×3 IMPLANT
PACK SURGICAL SETUP 50X90 (CUSTOM PROCEDURE TRAY) ×3 IMPLANT
PATTIES SURGICAL .5 X3 (DISPOSABLE) ×3 IMPLANT
SOLUTION BUTLER CLEAR DIP (MISCELLANEOUS) IMPLANT
SYR CONTROL 10ML LL (SYRINGE) ×3 IMPLANT
TOWEL GREEN STERILE FF (TOWEL DISPOSABLE) ×6 IMPLANT
TUBE CONNECTING 12'X1/4 (SUCTIONS) ×1
TUBE CONNECTING 12X1/4 (SUCTIONS) ×2 IMPLANT
TUBE CONNECTING 20'X1/4 (TUBING)
TUBE CONNECTING 20X1/4 (TUBING) IMPLANT

## 2019-03-15 NOTE — Anesthesia Procedure Notes (Addendum)
Procedure Name: Intubation Date/Time: 03/15/2019 10:21 AM Performed by: Junie Bame, RN Pre-anesthesia Checklist: Patient identified, Emergency Drugs available, Suction available and Patient being monitored Patient Re-evaluated:Patient Re-evaluated prior to induction Oxygen Delivery Method: Circle System Utilized Preoxygenation: Pre-oxygenation with 100% oxygen Induction Type: IV induction Ventilation: Mask ventilation without difficulty Laryngoscope Size: Mac and 4 Grade View: Grade II Laser Tube: Laser Tube Tube size: 6.0 mm Number of attempts: 1 Airway Equipment and Method: Stylet Placement Confirmation: ETT inserted through vocal cords under direct vision,  positive ETCO2 and breath sounds checked- equal and bilateral Tube secured with: Tape Dental Injury: Teeth and Oropharynx as per pre-operative assessment

## 2019-03-15 NOTE — Discharge Instructions (Signed)
Diet as tolerated Tylenol, ibuprofen or throat lozenges for sore throat as needed Rest voice but OK to talk Return to see Dr Lucia Gaskins nest Thursday at 4:30 Call office if any problems or questions      [phone]

## 2019-03-15 NOTE — Interval H&P Note (Signed)
History and Physical Interval Note:  03/15/2019 9:36 AM  David Castaneda  has presented today for surgery, with the diagnosis of HOARSENESS.  The various methods of treatment have been discussed with the patient and family. After consideration of risks, benefits and other options for treatment, the patient has consented to  Procedure(s): MICROLARYNGOSCOPY WITH POSSIBLE CO2 LASER AND EXCISION OF VOCAL CORD LESION (N/A) as a surgical intervention.  The patient's history has been reviewed, patient examined, no change in status, stable for surgery.  I have reviewed the patient's chart and labs.  Questions were answered to the patient's satisfaction.     Melony Overly

## 2019-03-15 NOTE — Op Note (Signed)
David Castaneda, David Castaneda MEDICAL RECORD [id number] ACCOUNT [id number] DATE OF BIRTH:03/31/1951 FACILITY: [facility] LOCATION: [facility] PHYSICIAN:Divit Stipp Lincoln Maxin, MD  OPERATIVE REPORT  DATE OF PROCEDURE:  03/15/2019  PREOPERATIVE DIAGNOSIS:  Chronic hoarseness.  POSTOPERATIVE DIAGNOSIS:  Chronic hoarseness.  OPERATIONS PERFORMED:   1.  Microlaryngoscopy with excision of left true vocal cord lesion with frozen section.   2.  Use of CO2 laser.  SURGEON:  Melony Overly, MD  ANESTHESIA:  General endotracheal.  COMPLICATIONS:  None.  BRIEF CLINICAL NOTE:  The patient is a 68 year old gentleman who has had chronic hoarseness for several months now.  On exam in the office, the patient has a pedunculated lesion arising from the left true vocal cord.  Both vocal cords seemed to have  normal mobility.  The lesion appears exophytic.  It occupies a large amount of the glottis.  The patient is taken to the operating room at this time for a direct laryngoscopy and biopsy.  DESCRIPTION OF PROCEDURE:  After adequate endotracheal anesthesia, a direct laryngoscopy was performed.  Of note, on initially performing the laryngoscopy, the patient had a fair amount of blood and blood clot in the oropharynx.  The patient apparently  had trauma to the vocal cords on intubation.  After cleaning the blood from the supraglottic and hypopharynx, the laryngoscope was used to visualize the vocal cords.  It was suspended.  A cotton pledget soaked in adrenaline was placed for hemostasis.   The patient had an exophytic lesion sitting now just in the subglottic area with trauma to the superior portion of the left true vocal cord with some mild hematoma around the vocal cord.  The right true vocal cord was normal in appearance.  Photos were  obtained.  The tissue was removed and sent for frozen section.  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  Additional tissue was sent as a permanent specimen.   The edges of the mucosa that had some papillomatous changes was removed  with scissors as well as a CO2 laser.  Adrenaline-soaked pledgets were used to help control hemostasis.  Subglottic area was clear.  Epiglottis, AE folds, supraglottic false cords were all clear.  This completed the procedure.  The patient was  subsequently awoken from anesthesia and transferred to recovery room postoperatively doing well.  DISPOSITION:  The patient is to be discharged home later this morning or this afternoon and will follow up in my office in 1 week for recheck.  He was instructed to take a medicine that he normally takes and rest his voice.  Recommended taking throat  lozenges for sore throat, as well as ibuprofen and Tylenol p.r.n. pain.  VN/NUANCE  D:03/15/2019 T:03/15/2019 JOB:[id number]

## 2019-03-15 NOTE — Progress Notes (Signed)
Pt. Reported he ate a Junior Mint this morning at 0400. Notified Dr. Ermalene Postin At [phone], stated surgery will be delayed 6 hours. Notified Dr. Loletha Grayer. Newman of above.

## 2019-03-15 NOTE — Brief Op Note (Signed)
03/15/2019  11:47 AM  PATIENT:  David Castaneda  68 y.o. male  PRE-OPERATIVE DIAGNOSIS:  HOARSENESS  POST-OPERATIVE DIAGNOSIS:  HOARSENESS  PROCEDURE:  Procedure(s): MICROLARYNGOSCOPY WITH POSSIBLE CO2 LASER AND EXCISION OF VOCAL CORD LESION (N/A) LEFT  SURGEON:  Surgeon(s) and Role:    Rozetta Nunnery, MD - Primary  PHYSICIAN ASSISTANT:   ASSISTANTS: none   ANESTHESIA:   general  EBL:  25 cc   BLOOD ADMINISTERED:none  DRAINS: none   LOCAL MEDICATIONS USED:  NONE  SPECIMEN:  Source of Specimen:  Left true vocal cord  DISPOSITION OF SPECIMEN:  PATHOLOGY  COUNTS:  YES  TOURNIQUET:  * No tourniquets in log *  DICTATION: .Other Dictation: Dictation Number [id number]  PLAN OF CARE: Discharge to home after PACU  PATIENT DISPOSITION:  PACU - hemodynamically stable.   Delay start of Pharmacological VTE agent (>24hrs) due to surgical blood loss or risk of bleeding: yes

## 2019-03-15 NOTE — Transfer of Care (Signed)
Immediate Anesthesia Transfer of Care Note  Patient: David Castaneda  Procedure(s) Performed: MICROLARYNGOSCOPY WITH POSSIBLE CO2 LASER AND EXCISION OF VOCAL CORD LESION (N/A )  Patient Location: PACU  Anesthesia Type:General  Level of Consciousness: awake, alert  and patient cooperative  Airway & Oxygen Therapy: Patient Spontanous Breathing  Post-op Assessment: Report given to RN and Post -op Vital signs reviewed and stable  Post vital signs: Reviewed and stable  Last Vitals:  Vitals Value Taken Time  BP 103/62 03/15/19 1154  Temp    Pulse 72 03/15/19 1156  Resp 17 03/15/19 1156  SpO2 99 % 03/15/19 1156  Vitals shown include unvalidated device data.  Last Pain:  Vitals:   03/15/19 0634  PainSc: 0-No pain      Patients Stated Pain Goal: 4 ([id number])  Complications: No apparent anesthesia complications

## 2019-03-17 NOTE — Anesthesia Postprocedure Evaluation (Signed)
Anesthesia Post Note  Patient: David Castaneda  Procedure(s) Performed: MICROLARYNGOSCOPY WITH POSSIBLE CO2 LASER AND EXCISION OF VOCAL CORD LESION (N/A )     Anesthesia Post Evaluation  Last Vitals:  Vitals:   03/15/19 1242 03/15/19 1256  BP: 96/62 103/74  Pulse: 65 63  Resp: 16 15  Temp: (!) 36.3 C   SpO2: 97% 97%    Last Pain:  Vitals:   03/15/19 1256  PainSc: 0-No pain                 Amour Cutrone

## 2019-03-19 LAB — SURGICAL PATHOLOGY

## 2019-03-21 ENCOUNTER — Encounter (INDEPENDENT_AMBULATORY_CARE_PROVIDER_SITE_OTHER): Payer: Self-pay | Admitting: Otolaryngology

## 2019-03-21 ENCOUNTER — Other Ambulatory Visit: Payer: Self-pay

## 2019-03-21 ENCOUNTER — Telehealth: Payer: Self-pay | Admitting: *Deleted

## 2019-03-21 ENCOUNTER — Ambulatory Visit (INDEPENDENT_AMBULATORY_CARE_PROVIDER_SITE_OTHER): Payer: Medicare Other | Admitting: Otolaryngology

## 2019-03-21 VITALS — Temp 97.9°F

## 2019-03-21 DIAGNOSIS — Z4889 Encounter for other specified surgical aftercare: Secondary | ICD-10-CM

## 2019-03-21 NOTE — Telephone Encounter (Signed)
Oncology Nurse Navigator Documentation  Rec'd call from ENT Dr. Lucia Gaskins.  He indicated he spoke with pt s/p [facility] ENT Conference, pt favoring surgery but Dr. Lucia Gaskins encouraged discussion with Dr. Isidore Moos re XRT. EBS sent to [place] office.  Gayleen Orem, RN, BSN Head & Neck Oncology Nurse [place] at [place] [phone]

## 2019-03-21 NOTE — Progress Notes (Signed)
HPI: David Castaneda is a 68 y.o. male who presents 6 days s/p microlaryngoscopy and excision of left vocal cord lesion.  Although frozen section revealed benign pathology final pathology report revealed squamous cell carcinoma with positive margins..   Past Medical History:  Diagnosis Date  . CHF (congestive heart failure) ([place])   . Former smoker   . History of kidney stones   . Kidney stone   . OSA (obstructive sleep apnea) 06/04/2018   Severe obstructive sleep apnea with an AHI of 93/h with oxygen desaturations as low as 81%. Now on CPAP at 11 cm H2O.   . Sucking chest wound    Past Surgical History:  Procedure Laterality Date  . CARDIAC CATHETERIZATION    . DENTAL SURGERY    . MICROLARYNGOSCOPY WITH CO2 LASER AND EXCISION OF VOCAL CORD LESION N/A 03/15/2019   Procedure: MICROLARYNGOSCOPY WITH POSSIBLE CO2 LASER AND EXCISION OF VOCAL CORD LESION;  Surgeon: Rozetta Nunnery, MD;  Location: [facility];  Service: ENT;  Laterality: N/A;  . wound     wound in back in war   Social History   Socioeconomic History  . Marital status: Divorced    Spouse name: Not on file  . Number of children: Not on file  . Years of education: Not on file  . Highest education level: Not on file  Occupational History  . Not on file  Tobacco Use  . Smoking status: Former Smoker    Packs/day: 1.00    Years: 50.00    Pack years: 50.00    Start date: 1968    Quit date: 2018    Years since quitting: 2.9  . Smokeless tobacco: Never Used  Substance and Sexual Activity  . Alcohol use: Not Currently  . Drug use: Never  . Sexual activity: Not on file  Other Topics Concern  . Not on file  Social History Narrative  . Not on file   Social Determinants of Health   Financial Resource Strain:   . Difficulty of Paying Living Expenses: Not on file  Food Insecurity:   . Worried About Charity fundraiser in the Last Year: Not on file  . Ran Out of Food in the Last Year: Not on file  Transportation Needs:    . Lack of Transportation (Medical): Not on file  . Lack of Transportation (Non-Medical): Not on file  Physical Activity:   . Days of Exercise per Week: Not on file  . Minutes of Exercise per Session: Not on file  Stress:   . Feeling of Stress : Not on file  Social Connections:   . Frequency of Communication with Friends and Family: Not on file  . Frequency of Social Gatherings with Friends and Family: Not on file  . Attends Religious Services: Not on file  . Active Member of Clubs or Organizations: Not on file  . Attends Archivist Meetings: Not on file  . Marital Status: Not on file   Family History  Problem Relation Age of Onset  . Dementia Mother   . Heart disease Father   . Lung cancer Brother        SCLC   No Known Allergies Prior to Admission medications   Medication Sig Start Date End Date Taking? Authorizing Provider  acetaminophen (TYLENOL) 500 MG tablet Take 500 mg by mouth every 6 (six) hours as needed for moderate pain or headache.    Yes [provider]  aspirin 81 MG chewable tablet Chew 81  mg by mouth 2 (two) times daily.   Yes [provider]  carvedilol (COREG) 6.25 MG tablet Take 1 tablet (6.25 mg total) by mouth 2 (two) times daily. 02/11/19 02/11/20 Yes Larey Dresser, MD  ENTRESTO 24-26 MG TAKE 1 TABLET BY MOUTH TWICE A DAY Patient taking differently: Take 1 tablet by mouth 2 (two) times daily.  11/19/18  Yes Larey Dresser, MD  famotidine (PEPCID) 20 MG tablet Take 1 tablet (20 mg total) by mouth at bedtime. Patient taking differently: Take 20 mg by mouth 2 (two) times daily.  10/19/17  Yes Regalado, Belkys A, MD  furosemide (LASIX) 40 MG tablet TAKE 1.5 TABLET BY MOUTH EVERY MORNING AND 1 TABLET EVERY EVENING. Patient taking differently: Take 40-60 mg by mouth See admin instructions. Take 60 mg in the morning and 40 mg at night 12/18/18  Yes Larey Dresser, MD  oxymetazoline (AFRIN) 0.05 % nasal spray Place 1 spray into both  nostrils 2 (two) times daily as needed for congestion.   Yes [provider]  spironolactone (ALDACTONE) 25 MG tablet TAKE 1 TABLET BY MOUTH EVERY DAY 03/13/19  Yes Larey Dresser, MD     Physical Exam: Fiberoptic laryngoscopy today revealed some white plaque on the left vocal cord where the previous tumor was removed.  No gross disease noted.  Vocal cord had normal mobility.   Assessment: S/p microlaryngoscopy and biopsy. 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  Reviewed with him concerning repeat surgery with CO2 laser which would involve excising a portion of the left true vocal cord versus treatment with radiation therapy. They are presently leaning toward surgery with CO2 laser excision however I will have radiation therapy consulted to discuss radiation therapy for the lesion. They will call me back after the holidays concerning whether he gets surgery or radiation therapy for treatment of this.   Radene Journey, MD

## 2019-03-22 ENCOUNTER — Telehealth: Payer: Self-pay | Admitting: Radiation Oncology

## 2019-03-22 NOTE — Telephone Encounter (Signed)
New message:   LVM for patient to return call to schedule appt from referral received. Called the main number on file and vcmail box is full. Called home number

## 2019-03-22 NOTE — Progress Notes (Signed)
Head and Neck Cancer Location of Tumor / Histology:  03/15/19 FINAL MICROSCOPIC DIAGNOSIS: A. VOCAL CORD, LEFT LESION, BIOPSY: - Squamous cell carcinoma - See comment B. VOCAL CORD, LEFT LESION, BIOPSY: - Squamous cell carcinoma  Patient presented months ago with symptoms of: He presented to Dr. Lucia Gaskins on 03/08/19 with a history of hoarseness that came and went since September  and left ear discomfort.   Biopsies of left vocal cord revealed: Squamous Cell carcinoma  Nutrition Status Yes No Comments  Weight changes? [x]  []  He reports losing about 30 since July 2019  Swallowing concerns? []  [x]    PEG? []  [x]     Referrals Yes No Comments  Social Work? []  [x]    Dentistry? []  [x]    Swallowing therapy? []  [x]    Nutrition? []  [x]    Med/Onc? []  [x]     Safety Issues Yes No Comments  Prior radiation? []  [x]    Pacemaker/ICD? []  [x]    Possible current pregnancy? []  [x]    Is the patient on methotrexate? []  [x]     Tobacco/Marijuana/Snuff/ETOH use: He is a former smoker, quitting in 2018. He does not drink alcohol  Past/Anticipated interventions by otolaryngology, if any:  03/15/19  OPERATIONS PERFORMED:   1.  Microlaryngoscopy with excision of left true vocal cord lesion with frozen section.   2.  Use of CO2 laser. SURGEON:  Melony Overly, MD  Past/Anticipated interventions by medical oncology, if any:  Not scheduled.     Current Complaints / other details:

## 2019-03-25 ENCOUNTER — Encounter: Payer: Self-pay | Admitting: Radiation Oncology

## 2019-03-25 NOTE — Progress Notes (Signed)
Radiation Oncology         [phone] ________________________________  Initial outpatient Consultation by telephone as patient was unable to access MyChart video during pandemic precautions   Name: David Castaneda MRN: [id number]  Date: 03/26/2019  DOB: October 13, 1950  [id number], No Pcp Per  Rozetta Nunnery, *   REFERRING PHYSICIAN: Rozetta Nunnery, *  DIAGNOSIS:    ICD-10-CM   1. Malignant neoplasm of glottis ([place])  C32.0    Cancer Staging Malignant neoplasm of glottis ([place]) Staging form: Larynx - Glottis, AJCC 8th Edition - Clinical stage from 03/27/2019: Stage I (cT1a, cN0, cM0) - Signed by Eppie Gibson, MD on 03/27/2019   CHIEF COMPLAINT: Here to discuss management of vocal cord cancer  HISTORY OF PRESENT ILLNESS::David Castaneda is a 68 y.o. male who presented with a 2 month history of hoarseness.  Subsequently, the patient saw Dr. Lucia Gaskins who performed laryngosopy. This showed a slightly pedunculated mass arising from the mid left true vocal cord. Considered a T1a lesion per my discussion with Dr. Lucia Gaskins.  Microlaryngoscopy with excision of left true vocal cord lesion with frozen section performed with CO2 laser. Path from the vocal cord lesion on 03/15/2019 revealed: invasive squamous cell carcinoma.  Margins are considered positive.    Nutrition Status Yes No Comments  Weight changes? [x]  []  He reports losing about 30 since July 2019  Swallowing concerns? []  [x]    PEG? []  [x]     Referrals Yes No Comments  Social Work? []  [x]    Dentistry? []  [x]    Swallowing therapy? []  [x]    Nutrition? []  [x]    Med/Onc? []  [x]     Safety Issues Yes No Comments  Prior radiation? []  [x]    Pacemaker/ICD? []  [x]    Possible current pregnancy? []  [x]    Is the patient on methotrexate? []  [x]     Tobacco/Marijuana/Snuff/ETOH use: He is a former smoker, quitting in 2018. He does not drink alcohol   Tobacco history, if any: yes, quit smoking over 2 years ago, previously smoked  a pack per day for 50 years.  Prior cancers, if any: none  Per Dr Pollie Friar note on 03-21-19,  Discussed with [name] and his friend concerning treatment options for the T1 vocal cord cancer.  Reviewed with him concerning repeat surgery with CO2 laser which would involve excising a portion of the left true vocal cord versus treatment with radiation therapy. They are presently leaning toward surgery with CO2 laser excision however I will have radiation therapy consulted to discuss radiation therapy for the lesion. They will call me back after the holidays concerning whether he gets surgery or radiation therapy for treatment of this.  PREVIOUS RADIATION THERAPY: No  PAST MEDICAL HISTORY:  has a past medical history of CHF (congestive heart failure) ([place]), Former smoker, History of kidney stones, Kidney stone, OSA (obstructive sleep apnea) (06/04/2018), and Sucking chest wound.    PAST SURGICAL HISTORY: Past Surgical History:  Procedure Laterality Date  . CARDIAC CATHETERIZATION     patient states they went in and looked around but never placed a stent  . DENTAL SURGERY    . MICROLARYNGOSCOPY WITH CO2 LASER AND EXCISION OF VOCAL CORD LESION N/A 03/15/2019   Procedure: MICROLARYNGOSCOPY WITH POSSIBLE CO2 LASER AND EXCISION OF VOCAL CORD LESION;  Surgeon: Rozetta Nunnery, MD;  Location: [facility] OR;  Service: ENT;  Laterality: N/A;  . wound     wound in back in war    FAMILY HISTORY: family history includes Dementia in his mother; Heart  disease in his father; Lung cancer in his brother.  SOCIAL HISTORY:  reports that he quit smoking about 2 years ago. He started smoking about 53 years ago. He has a 50.00 pack-year smoking history. He has never used smokeless tobacco. He reports current alcohol use. He reports that he does not use drugs.  ALLERGIES: Patient has no known allergies.  MEDICATIONS:  Current Outpatient Medications  Medication Sig Dispense Refill  . acetaminophen (TYLENOL) 500 MG  tablet Take 500 mg by mouth every 6 (six) hours as needed for moderate pain or headache.     Marland Kitchen aspirin 81 MG chewable tablet Chew 81 mg by mouth 2 (two) times daily.    . carvedilol (COREG) 6.25 MG tablet Take 1 tablet (6.25 mg total) by mouth 2 (two) times daily. 60 tablet 11  . ENTRESTO 24-26 MG TAKE 1 TABLET BY MOUTH TWICE A DAY (Patient taking differently: Take 1 tablet by mouth 2 (two) times daily. ) 60 tablet 6  . famotidine (PEPCID) 20 MG tablet Take 1 tablet (20 mg total) by mouth at bedtime. (Patient taking differently: Take 20 mg by mouth 2 (two) times daily. ) 30 tablet 0  . furosemide (LASIX) 40 MG tablet TAKE 1.5 TABLET BY MOUTH EVERY MORNING AND 1 TABLET EVERY EVENING. (Patient taking differently: Take 40-60 mg by mouth See admin instructions. Take 60 mg in the morning and 40 mg at night) 80 tablet 6  . oxymetazoline (AFRIN) 0.05 % nasal spray Place 1 spray into both nostrils 2 (two) times daily as needed for congestion.    Marland Kitchen spironolactone (ALDACTONE) 25 MG tablet TAKE 1 TABLET BY MOUTH EVERY DAY 30 tablet 6   No current facility-administered medications for this encounter.    REVIEW OF SYSTEMS:  Notable for that above.   PHYSICAL EXAM:  vitals were not taken for this visit.   NAD, alert and oriented and conversant  LABORATORY DATA:  Lab Results  Component Value Date   WBC 7.2 03/15/2019   HGB 13.1 03/15/2019   HCT 39.1 03/15/2019   MCV 90.7 03/15/2019   PLT 220 03/15/2019   CMP     Component Value Date/Time   NA 138 03/15/2019 0614   K 3.8 03/15/2019 0614   CL 105 03/15/2019 0614   CO2 23 03/15/2019 0614   GLUCOSE 119 (H) 03/15/2019 0614   BUN 9 03/15/2019 0614   CREATININE 0.89 03/15/2019 0614   CALCIUM 9.1 03/15/2019 0614   AST 81 (H) 10/16/2017 1000   ALT 30 10/16/2017 1000   GFRNONAA >60 03/15/2019 0614   GFRAA >60 03/15/2019 0614      Lab Results  Component Value Date   TSH 0.750 10/16/2017     RADIOGRAPHY: MR Angiogram Chest W Wo  Contrast  Result Date: 03/01/2019 CLINICAL DATA:  68 year old with aneurysm of the ascending thoracic aorta. EXAM: MRA CHEST WITH OR WITHOUT CONTRAST TECHNIQUE: Angiographic images of the chest were obtained using MRA technique without and with intravenous contrast. CONTRAST:  31mL GADAVIST GADOBUTROL 1 MMOL/ML IV SOLN COMPARISON:  Cardiac MRI 10/20/2017 FINDINGS: VASCULAR Aorta: Aortic root at the sinuses of Valsalva measures up to 4.4 cm and similar to the prior MR examination. Mid ascending thoracic aorta measures up to 4.1 cm in stable. Common trunk for the left common carotid artery and innominate artery. Great vessels are patent. Distal aortic arch measures 2.9 cm. Proximal descending thoracic aorta measures 3.1 cm. Mid descending thoracic aorta measures 2.9 cm. Distal descending thoracic aorta measures 2.7  cm. No evidence for an aortic dissection. Heart: Heart size is normal.  No significant pericardial effusion. Pulmonary Arteries: Normal caliber of the main pulmonary arteries without large filling defects. Other: Bilateral vertebral arteries are patent. Bilateral subclavian arteries are patent. Celiac trunk, proximal SMA and proximal bilateral renal arteries are patent. Proximal internal jugular veins are patent. Innominate veins and SVC are patent. Azygos vein is patent. NON-VASCULAR Multiple large hepatic cysts. No large pleural effusions. No gross lung abnormalities. IMPRESSION: 1. Stable dilatation of the aortic root measuring up to 4.4 cm. 2. Stable fusiform aneurysm of the ascending thoracic aorta measuring up to 4.1 cm. Recommend annual imaging followup by CTA or MRA. This recommendation follows 2010 ACCF/AHA/AATS/ACR/ASA/SCA/SCAI/SIR/STS/SVM Guidelines for the Diagnosis and Management of Patients with Thoracic Aortic Disease. Circulation. 2010; [id number]. Aortic aneurysm NOS (ICD10-I71.9) 3. Hepatic cysts. Electronically Signed   By: Markus Daft M.D.   On: 03/01/2019 14:33       IMPRESSION/PLAN:  This is a delightful patient with glottic head and neck cancer. I discussed the option of radiotherapy for this patient.  We discussed the potential risks, benefits, and side effects of 4-6 weeks of  (hypofractioned vs standard) radiotherapy. We talked in detail about acute and late effects. We discussed that some of the most bothersome acute effects may be mucositis, skin irritation, throat pain needing pain medications, dysphagia, acute hoarseness and fatigue. We talked about late effects which include but are not necessarily limited to dysphagia, hypothyroidism, laryngeal injury. No guarantees of treatment were given.  Generally patients have better voice quality in the long-term after radiotherapy versus surgical excision.  That being said, surgical is excision would be more straightforward in certain ways and would not require multiple trips to and from the hospital/clinic - this is very appealing to him, especially during the pandemic, and given his distance from the hospital.  If he undergoes surgical excision it is particularly important that he has close follow-up with otolaryngology so that he can undergo laryngoscopies and make sure there is no sign of local recurrence.  He is committed to this.  After a lengthy discussion he is enthusiastic about undergoing reexcision with Dr. Lucia Gaskins and close follow-up in his clinic.  I wished him the best and he understands that radiotherapy could be considered as salvage if he recurs.  However, I am hopeful that I will never have to see him for this reason.   This encounter was provided by telemedicine platform by telephone as patient was unable to access MyChart video during pandemic precautions The patient has given verbal consent for this type of encounter and has been advised to only accept a meeting of this type in a secure network environment. The time spent during this encounter was 30 minutes. The attendants for this meeting  include Eppie Gibson  and Jennings Books. Gayleen Orem, RN, our Head and Neck Oncology Navigator was present as well. During the encounter, Eppie Gibson was located at [facility] Radiation Oncology Department.  Goldie Sansom was located at home.  __________________________________________   Eppie Gibson, MD   This document serves as a record of services personally performed by Eppie Gibson, MD. It was created on her behalf by Wilburn Mylar, a trained medical scribe. The creation of this record is based on the scribe's personal observations and the provider's statements to them. This document has been checked and approved by the attending provider.

## 2019-03-26 ENCOUNTER — Encounter: Payer: Self-pay | Admitting: Radiation Oncology

## 2019-03-26 ENCOUNTER — Other Ambulatory Visit: Payer: Self-pay

## 2019-03-26 ENCOUNTER — Telehealth: Payer: Self-pay | Admitting: *Deleted

## 2019-03-26 ENCOUNTER — Ambulatory Visit
Admission: RE | Admit: 2019-03-26 | Discharge: 2019-03-26 | Disposition: A | Discharge status: Home / Self Care | Payer: Medicare Other | Source: Ambulatory Visit | Attending: Radiation Oncology | Admitting: Radiation Oncology

## 2019-03-26 DIAGNOSIS — C320 Malignant neoplasm of glottis: Secondary | ICD-10-CM

## 2019-03-26 NOTE — Telephone Encounter (Signed)
Oncology Nurse Navigator Documentation  Joined Dr. Isidore Moos during teleconsult with Mr. Cirelli for discussion of RT for L VC SCC. He voiced understanding of RT tmt options vs additional surgery with ENT Dr. Lucia Gaskins, chose to move forward with additional surgery. He voiced understanding I will notify Dr. Lucia Gaskins of his decision.   Gayleen Orem, RN, BSN Head & Neck Oncology Nurse [place] at [place] [phone]

## 2019-03-26 NOTE — Telephone Encounter (Signed)
Oncology Nurse Navigator Documentation  In follow-up to pt's consult this morning with Dr. Isidore Moos, called ENT Dr. Pollie Friar office, spoke with Olin Hauser, informed her Mr. Garno wishes to proceed with additional surgery as tmt vs XRT.  She voiced understanding, indicated she would inform Dr. Lucia Gaskins.  Gayleen Orem, RN, BSN Head & Neck Oncology Nurse [place] at [place] [phone]

## 2019-03-27 ENCOUNTER — Encounter: Payer: Self-pay | Admitting: Radiation Oncology

## 2019-03-27 DIAGNOSIS — C320 Malignant neoplasm of glottis: Secondary | ICD-10-CM | POA: Insufficient documentation

## 2019-04-08 NOTE — Progress Notes (Signed)
[id number] - [place], [place] [address] [place] [address] Phone: [phone] Fax: [phone]      Your procedure is scheduled on January 8  Report to [facility] Main Entrance "A" at 0530 A.M., and check in at the Admitting office.  Call this number if you have problems the morning of surgery:  [phone]  Call [phone] if you have any questions prior to your surgery date Monday-Friday 8am-4pm    Remember:  Do not eat or drink after midnight the night before your surgery     Take these medicines the morning of surgery with A SIP OF WATER  acetaminophen (TYLENOL) if needed carvedilol (COREG) famotidine (PEPCID  Follow your surgeon's instructions on when to stop Aspirin.  If no instructions were given by your surgeon then you will need to call the office to get those instructions.    7 days prior to surgery STOP taking any Aspirin (unless otherwise instructed by your surgeon), Aleve, Naproxen, Ibuprofen, Motrin, Advil, Goody's, BC's, all herbal medications, fish oil, and all vitamins.    The Morning of Surgery  Do not wear jewelry  Do not wear lotions, powders, or colognes, or deodorant   Men may shave face and neck.  Do not bring valuables to the hospital.  [facility] is not responsible for any belongings or valuables.  If you are a smoker, DO NOT Smoke 24 hours prior to surgery  If you wear a CPAP at night please bring your mask, tubing, and machine the morning of surgery   Remember that you must have someone to transport you home after your surgery, and remain with you for 24 hours if you are discharged the same day.   Please bring cases for contacts, glasses, hearing aids, dentures or bridgework because it cannot be worn into surgery.    Leave your suitcase in the car.  After surgery it may be brought to your room.  For patients admitted to the hospital, discharge time will be determined by your treatment  team.  Patients discharged the day of surgery will not be allowed to drive home.    Special instructions:   [place]- Preparing For Surgery  Before surgery, you can play an important role. Because skin is not sterile, your skin needs to be as free of germs as possible. You can reduce the number of germs on your skin by washing with CHG (chlorahexidine gluconate) Soap before surgery.  CHG is an antiseptic cleaner which kills germs and bonds with the skin to continue killing germs even after washing.    Oral Hygiene is also important to reduce your risk of infection.  Remember - BRUSH YOUR TEETH THE MORNING OF SURGERY WITH YOUR REGULAR TOOTHPASTE  Please do not use if you have an allergy to CHG or antibacterial soaps. If your skin becomes reddened/irritated stop using the CHG.  Do not shave (including legs and underarms) for at least 48 hours prior to first CHG shower. It is OK to shave your face.  Please follow these instructions carefully.   1. Shower the NIGHT BEFORE SURGERY and the MORNING OF SURGERY with CHG Soap.   2. If you chose to wash your hair, wash your hair first as usual with your normal shampoo.  3. After you shampoo, rinse your hair and body thoroughly to remove the shampoo.  4. Use CHG as you would any other liquid soap. You can apply CHG directly to the skin and wash  gently with a scrungie or a clean washcloth.   5. Apply the CHG Soap to your body ONLY FROM THE NECK DOWN.  Do not use on open wounds or open sores. Avoid contact with your eyes, ears, mouth and genitals (private parts). Wash Face and genitals (private parts)  with your normal soap.   6. Wash thoroughly, paying special attention to the area where your surgery will be performed.  7. Thoroughly rinse your body with warm water from the neck down.  8. DO NOT shower/wash with your normal soap after using and rinsing off the CHG Soap.  9. Pat yourself dry with a CLEAN TOWEL.  10. Wear CLEAN PAJAMAS to bed  the night before surgery, wear comfortable clothes the morning of surgery  11. Place CLEAN SHEETS on your bed the night of your first shower and DO NOT SLEEP WITH PETS.    Day of Surgery:  Please shower the morning of surgery with the CHG soap Do not apply any deodorants/lotions. Please wear clean clothes to the hospital/surgery center.   Remember to brush your teeth WITH YOUR REGULAR TOOTHPASTE.   Please read over the following fact sheets that you were given.

## 2019-04-09 ENCOUNTER — Encounter (HOSPITAL_COMMUNITY): Payer: Self-pay

## 2019-04-09 ENCOUNTER — Other Ambulatory Visit: Payer: Self-pay

## 2019-04-09 ENCOUNTER — Ambulatory Visit (INDEPENDENT_AMBULATORY_CARE_PROVIDER_SITE_OTHER): Payer: Self-pay | Admitting: Otolaryngology

## 2019-04-09 ENCOUNTER — Other Ambulatory Visit (HOSPITAL_COMMUNITY)
Admission: RE | Admit: 2019-04-09 | Discharge: 2019-04-09 | Disposition: A | Discharge status: Home / Self Care | Payer: Medicare Other | Source: Ambulatory Visit | Attending: Otolaryngology | Admitting: Otolaryngology

## 2019-04-09 ENCOUNTER — Encounter (HOSPITAL_COMMUNITY)
Admission: RE | Admit: 2019-04-09 | Discharge: 2019-04-09 | Disposition: A | Discharge status: Home / Self Care | Payer: Medicare Other | Source: Ambulatory Visit | Attending: Otolaryngology | Admitting: Otolaryngology

## 2019-04-09 DIAGNOSIS — Z20822 Contact with and (suspected) exposure to covid-19: Secondary | ICD-10-CM | POA: Insufficient documentation

## 2019-04-09 DIAGNOSIS — C320 Malignant neoplasm of glottis: Secondary | ICD-10-CM

## 2019-04-09 DIAGNOSIS — Z01812 Encounter for preprocedural laboratory examination: Secondary | ICD-10-CM | POA: Diagnosis not present

## 2019-04-09 LAB — BASIC METABOLIC PANEL
Anion gap: 11 (ref 5–15)
BUN: 12 mg/dL (ref 8–23)
CO2: 23 mmol/L (ref 22–32)
Calcium: 9.1 mg/dL (ref 8.9–10.3)
Chloride: 104 mmol/L (ref 98–111)
Creatinine, Ser: 0.86 mg/dL (ref 0.61–1.24)
GFR calc Af Amer: >60 mL/min (ref >60)
GFR calc non Af Amer: >60 mL/min (ref >60)
Glucose, Bld: 110 mg/dL — ABNORMAL HIGH (ref 70–99)
Potassium: 4 mmol/L (ref 3.5–5.1)
Sodium: 138 mmol/L (ref 135–145)

## 2019-04-09 LAB — CBC
HCT: 41.5 % (ref 39.0–52.0)
Hemoglobin: 13.8 g/dL (ref 13.0–17.0)
MCH: 29.8 pg (ref 26.0–34.0)
MCHC: 33.3 g/dL (ref 30.0–36.0)
MCV: 89.6 fL (ref 80.0–100.0)
Platelets: 226 10*3/uL (ref 150–400)
RBC: 4.63 MIL/uL (ref 4.22–5.81)
RDW: 13.2 % (ref 11.5–15.5)
WBC: 6.9 10*3/uL (ref 4.0–10.5)
nRBC: 0 % (ref 0.0–0.2)

## 2019-04-09 LAB — SARS CORONAVIRUS 2 (TAT 6-24 HRS): SARS Coronavirus 2: NEGATIVE

## 2019-04-09 NOTE — Progress Notes (Signed)
PCP:  none Cardiologist:  Dr. Loralie Champagne  EKG:  03/15/19 CXR:  N/A ECHO: 02/11/19 Stress Test:  denies Cardiac Cath:  10/16/17  covid test 04/09/19  Anesthesia Review:  Yes, cardiac history, low BP  Patient denies shortness of breath, fever, cough, and chest pain at PAT appointment.  Patient verbalized understanding of instructions provided today at the PAT appointment.  Patient asked to review instructions at home and day of surgery.

## 2019-04-09 NOTE — H&P (Signed)
PREOPERATIVE H&P  Chief Complaint: Left vocal cord cancer  HPI: David Castaneda is a 69 y.o. male who presents for evaluation of left true vocal cord cancer.  He is status post initial direct laryngoscopy and excisional biopsy of a left vocal cord lesion that was initially thought to be benign but on final report revealed squamous cell carcinoma with positive margin.  After discussion with radiation oncologist as well as the patient patient elected surgical intervention with reexcision of the left true vocal cord.  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  Past Medical History:  Diagnosis Date  . CHF (congestive heart failure) ([place])   . Former smoker   . History of kidney stones   . OSA (obstructive sleep apnea) 06/04/2018   Severe obstructive sleep apnea with an AHI of 93/h with oxygen desaturations as low as 81%. Now on CPAP at 11 cm H2O.   . Sucking chest wound    1970's [place]   Past Surgical History:  Procedure Laterality Date  . CARDIAC CATHETERIZATION     patient states they went in and looked around but never placed a stent  . DENTAL SURGERY    . MICROLARYNGOSCOPY WITH CO2 LASER AND EXCISION OF VOCAL CORD LESION N/A 03/15/2019   Procedure: MICROLARYNGOSCOPY WITH POSSIBLE CO2 LASER AND EXCISION OF VOCAL CORD LESION;  Surgeon: Rozetta Nunnery, MD;  Location: [facility];  Service: ENT;  Laterality: N/A;  . wound     wound in back in war   Social History   Socioeconomic History  . Marital status: Divorced    Spouse name: Not on file  . Number of children: Not on file  . Years of education: Not on file  . Highest education level: Not on file  Occupational History  . Not on file  Tobacco Use  . Smoking status: Former Smoker    Packs/day: 1.00    Years: 50.00    Pack years: 50.00    Start date: 1968    Quit date: 04/04/2016    Years since quitting: 3.0  . Smokeless tobacco: Never Used   Substance and Sexual Activity  . Alcohol use: Yes    Comment: social  . Drug use: Never    Comment: remotely in the 1970's  . Sexual activity: Not on file  Other Topics Concern  . Not on file  Social History Narrative  . Not on file   Social Determinants of Health   Financial Resource Strain:   . Difficulty of Paying Living Expenses: Not on file  Food Insecurity:   . Worried About Charity fundraiser in the Last Year: Not on file  . Ran Out of Food in the Last Year: Not on file  Transportation Needs:   . Lack of Transportation (Medical): Not on file  . Lack of Transportation (Non-Medical): Not on file  Physical Activity:   . Days of Exercise per Week: Not on file  . Minutes of Exercise per Session: Not on file  Stress:   . Feeling of Stress : Not on file  Social Connections:   . Frequency of Communication with Friends and Family: Not on file  . Frequency of Social Gatherings with Friends and Family: Not on file  . Attends Religious Services: Not on file  . Active Member of Clubs or Organizations: Not on file  . Attends Archivist Meetings: Not on file  .  Marital Status: Not on file   Family History  Problem Relation Age of Onset  . Dementia Mother   . Heart disease Father   . Lung cancer Brother        SCLC   No Known Allergies Prior to Admission medications   Medication Sig Start Date End Date Taking? Authorizing Provider  acetaminophen (TYLENOL) 500 MG tablet Take 500 mg by mouth every 6 (six) hours as needed for moderate pain or headache.    Yes [provider]  aspirin 81 MG chewable tablet Chew 81 mg by mouth 2 (two) times daily.   Yes [provider]  carvedilol (COREG) 6.25 MG tablet Take 1 tablet (6.25 mg total) by mouth 2 (two) times daily. 02/11/19 02/11/20 Yes Larey Dresser, MD  ENTRESTO 24-26 MG TAKE 1 TABLET BY MOUTH TWICE A DAY Patient taking differently: Take 1 tablet by mouth 2 (two) times daily.  11/19/18  Yes Larey Dresser, MD  famotidine (PEPCID) 20 MG tablet Take 1 tablet (20 mg total) by mouth at bedtime. Patient taking differently: Take 20 mg by mouth 2 (two) times daily.  10/19/17  Yes Regalado, Belkys A, MD  furosemide (LASIX) 40 MG tablet TAKE 1.5 TABLET BY MOUTH EVERY MORNING AND 1 TABLET EVERY EVENING. Patient taking differently: Take 40-60 mg by mouth See admin instructions. Take 1.5 tablets (60 mg) by mouth in the morning & take 1 tablet (40 mg) by mouth in evening. 12/18/18  Yes Larey Dresser, MD  oxymetazoline (AFRIN) 0.05 % nasal spray Place 1 spray into both nostrils 2 (two) times daily as needed for congestion.   Yes [provider]  spironolactone (ALDACTONE) 25 MG tablet TAKE 1 TABLET BY MOUTH EVERY DAY Patient taking differently: Take 25 mg by mouth daily.  03/13/19  Yes Larey Dresser, MD     Positive ROS: Otherwise negative  All other systems have been reviewed and were otherwise negative with the exception of those mentioned in the HPI and as above.  Physical Exam: There were no vitals filed for this visit.  General: Alert, no acute distress Oral: Normal oral mucosa and tonsils Nasal: Clear nasal passages Neck: No palpable adenopathy or thyroid nodules Ear: Ear canal is clear with normal appearing TMs Cardiovascular: Regular rate and rhythm, no murmur.  Respiratory: Clear to auscultation Neurologic: Alert and oriented x 3 On last exam 2 weeks ago patient had good vocal cord mobility with minimal irregularity.  Assessment/Plan: VOCAL CORD CANCER Plan for Procedure(s): MICROLARYNGOSCOPY WITH EXCISION OF VOCAL CORD CANCER   Melony Overly, MD 04/09/2019 11:22 AM

## 2019-04-10 NOTE — Progress Notes (Signed)
Anesthesia Chart Review:  Follows with cardiology for hx of NICM, HFrEF (EF 45-50% by echo 11/20). Last sen by Dr. Marigene Ehlers 02/11/19, noted EF had improved and was out of ICD range. Also following thoracic aortic aneurysm, stable per MRA 03/01/19. Referred pt to ENT for eval of persistent hoarseness.   Severe OSA per sleep study 11/24/17. AHI 93.  Hx of Cirrhosis: Noted on CTA.Viral hepatitis labs negative. No history of heavy ETOH. Liver MRI showed cirrhosis and liver cysts.   Recently underwent similar procedure [id number] without complication.  Preop labs reviewed, WNL.   EKG 03/15/19: Sinus rhythm with PVC bigeminy. Rate 69. LAD. Low voltage QRS. Inferior infarct , age undetermined. Cannot rule out Anterior infarct , age undetermined  MRA chest 03/01/19: IMPRESSION: 1. Stable dilatation of the aortic root measuring up to 4.4 cm. 2. Stable fusiform aneurysm of the ascending thoracic aorta measuring up to 4.1 cm. Recommend annual imaging followup by CTA or MRA. This recommendation follows 2010 ACCF/AHA/AATS/ACR/ASA/SCA/SCAI/SIR/STS/SVM Guidelines for the Diagnosis and Management of Patients with Thoracic Aortic Disease. Circulation. 2010; [id number]. Aortic aneurysm NOS (ICD10-I71.9) 3. Hepatic cysts.  TTE 02/11/19: 1. Mildly dilated left ventricular internal cavity size. 2. Left ventricular ejection fraction, by visual estimation, is 45 to 50%, mildly decreased. There is no left ventricular hypertrophy. GLS -20.7%, normal range. 3. The left ventricle demonstrates global hypokinesis. 4. Left ventricular diastolic parameters are consistent with Grade I diastolic dysfunction (impaired relaxation). 5. Left atrial size was normal. 6. Right atrial size was normal. 7. The aortic valve is tricuspid. Aortic valve regurgitation is not visualized. No evidence of aortic valve sclerosis or stenosis. 8. There is mild dilatation of the aortic root measuring 39 mm. 9. Global right  ventricle has normal systolic function.The right ventricular size is normal. No increase in right ventricular wall thickness. 10. The mitral valve is normal in structure. No evidence of mitral valve regurgitation. No evidence of mitral stenosis. 11. The tricuspid valve is normal in structure. Tricuspid valve regurgitation is not demonstrated. 12. TR signal is inadequate for assessing pulmonary artery systolic pressure. 13. The inferior vena cava is normal in size with greater than 50% respiratory variability, suggesting right atrial pressure of 3 mmHg.  R/L Heart Cath 10/16/17: 1. Elevated right and left heart filling pressures.  2. Pulmonary venous hypertension.  3. Preserved cardiac output.  4. No significant coronary disease, nonischemic cardiomyopathy.    Wynonia Musty [facility] Short Stay Center/Anesthesiology Phone [phone] 04/10/2019 3:54 PM

## 2019-04-10 NOTE — Anesthesia Preprocedure Evaluation (Addendum)
Anesthesia Evaluation  Patient identified by MRN, date of birth, ID band Patient awake    Reviewed: Allergy & Precautions, NPO status , Patient's Chart, lab work & pertinent test results, reviewed documented beta blocker date and time   History of Anesthesia Complications Negative for: history of anesthetic complications  Airway Mallampati: III  TM Distance: >3 FB Neck ROM: Full    Dental  (+) Edentulous Upper, Edentulous Lower   Pulmonary sleep apnea and Continuous Positive Airway Pressure Ventilation , former smoker,    Pulmonary exam normal        Cardiovascular +CHF  Normal cardiovascular exam   '20 TTE - Mildly dilated left ventricular internal cavity size. EF 45 to 50%, LV demonstrates global hypokinesis. Grade I diastolic dysfunction (impaired relaxation). There is mild dilatation of the aortic root measuring 39 mm.  '19 Cath - 1. Elevated right and left heart filling pressures.  2. Pulmonary venous hypertension, PASP 76mmHg  3. Preserved cardiac output.  4. No significant coronary disease, nonischemic cardiomyopathy.     Neuro/Psych negative neurological ROS  negative psych ROS   GI/Hepatic GERD  Medicated and Controlled,(+) Cirrhosis       ,   Endo/Other  Morbid obesity  Renal/GU negative Renal ROS     Musculoskeletal negative musculoskeletal ROS (+)   Abdominal   Peds  Hematology negative hematology ROS (+)   Anesthesia Other Findings Vocal cord cancer Covid neg 1/5   Reproductive/Obstetrics                           Anesthesia Physical Anesthesia Plan  ASA: III  Anesthesia Plan: General   Post-op Pain Management:    Induction: Intravenous  PONV Risk Score and Plan: 2 and Treatment may vary due to age or medical condition, Ondansetron and Dexamethasone  Airway Management Planned: Oral ETT and Video Laryngoscope Planned  Additional Equipment: None  Intra-op  Plan:   Post-operative Plan: Extubation in OR  Informed Consent: I have reviewed the patients History and Physical, chart, labs and discussed the procedure including the risks, benefits and alternatives for the proposed anesthesia with the patient or authorized representative who has indicated his/her understanding and acceptance.     Dental advisory given  Plan Discussed with: CRNA and Anesthesiologist  Anesthesia Plan Comments:       Anesthesia Quick Evaluation

## 2019-04-11 MED ORDER — DEXTROSE 5 % IV SOLN
3.0000 g | INTRAVENOUS | Status: AC
  Administered 2019-04-12: 08:00:00 3 g via INTRAVENOUS
  Filled 2019-04-11: qty 3

## 2019-04-12 ENCOUNTER — Ambulatory Visit (HOSPITAL_COMMUNITY): Payer: Medicare Other | Admitting: Certified Registered"

## 2019-04-12 ENCOUNTER — Ambulatory Visit (HOSPITAL_COMMUNITY)
Admission: RE | Admit: 2019-04-12 | Discharge: 2019-04-12 | Disposition: A | Discharge status: Home / Self Care | Payer: Medicare Other | Source: Ambulatory Visit | Attending: Otolaryngology | Admitting: Otolaryngology

## 2019-04-12 ENCOUNTER — Ambulatory Visit (HOSPITAL_COMMUNITY): Payer: Medicare Other | Admitting: Vascular Surgery

## 2019-04-12 ENCOUNTER — Encounter (HOSPITAL_COMMUNITY)
Admission: RE | Disposition: A | Discharge status: Home / Self Care | Payer: Self-pay | Source: Ambulatory Visit | Attending: Otolaryngology

## 2019-04-12 ENCOUNTER — Other Ambulatory Visit: Payer: Self-pay

## 2019-04-12 DIAGNOSIS — Z87891 Personal history of nicotine dependence: Secondary | ICD-10-CM | POA: Diagnosis not present

## 2019-04-12 DIAGNOSIS — Z87442 Personal history of urinary calculi: Secondary | ICD-10-CM | POA: Diagnosis not present

## 2019-04-12 DIAGNOSIS — Z6841 Body Mass Index (BMI) 40.0 and over, adult: Secondary | ICD-10-CM | POA: Diagnosis not present

## 2019-04-12 DIAGNOSIS — Z7982 Long term (current) use of aspirin: Secondary | ICD-10-CM | POA: Diagnosis not present

## 2019-04-12 DIAGNOSIS — K7460 Unspecified cirrhosis of liver: Secondary | ICD-10-CM | POA: Diagnosis not present

## 2019-04-12 DIAGNOSIS — C320 Malignant neoplasm of glottis: Secondary | ICD-10-CM | POA: Insufficient documentation

## 2019-04-12 DIAGNOSIS — K219 Gastro-esophageal reflux disease without esophagitis: Secondary | ICD-10-CM | POA: Insufficient documentation

## 2019-04-12 DIAGNOSIS — Z8249 Family history of ischemic heart disease and other diseases of the circulatory system: Secondary | ICD-10-CM | POA: Insufficient documentation

## 2019-04-12 DIAGNOSIS — Z79899 Other long term (current) drug therapy: Secondary | ICD-10-CM | POA: Diagnosis not present

## 2019-04-12 DIAGNOSIS — I509 Heart failure, unspecified: Secondary | ICD-10-CM | POA: Insufficient documentation

## 2019-04-12 DIAGNOSIS — Z801 Family history of malignant neoplasm of trachea, bronchus and lung: Secondary | ICD-10-CM | POA: Insufficient documentation

## 2019-04-12 DIAGNOSIS — I5043 Acute on chronic combined systolic (congestive) and diastolic (congestive) heart failure: Secondary | ICD-10-CM | POA: Diagnosis not present

## 2019-04-12 DIAGNOSIS — G4733 Obstructive sleep apnea (adult) (pediatric): Secondary | ICD-10-CM | POA: Insufficient documentation

## 2019-04-12 HISTORY — PX: MICROLARYNGOSCOPY WITH CO2 LASER AND EXCISION OF VOCAL CORD LESION: SHX5970

## 2019-04-12 SURGERY — MICROLARYNGOSCOPY WITH CO2 LASER AND EXCISION OF VOCAL CORD LESION
Anesthesia: General | Site: Throat

## 2019-04-12 MED ORDER — FENTANYL CITRATE (PF) 250 MCG/5ML IJ SOLN
INTRAMUSCULAR | Status: AC
  Filled 2019-04-12: qty 5

## 2019-04-12 MED ORDER — FENTANYL CITRATE (PF) 100 MCG/2ML IJ SOLN: 25.0000 ug | INTRAMUSCULAR | Status: DC | PRN

## 2019-04-12 MED ORDER — PHENYLEPHRINE 40 MCG/ML (10ML) SYRINGE FOR IV PUSH (FOR BLOOD PRESSURE SUPPORT)
PREFILLED_SYRINGE | INTRAVENOUS | Status: DC | PRN
  Administered 2019-04-12: 80 ug via INTRAVENOUS

## 2019-04-12 MED ORDER — ACETAMINOPHEN 325 MG PO TABS: 325.0000 mg | ORAL_TABLET | ORAL | Status: DC | PRN

## 2019-04-12 MED ORDER — OXYCODONE HCL 5 MG PO TABS: 5.0000 mg | ORAL_TABLET | Freq: Once | ORAL | Status: DC | PRN

## 2019-04-12 MED ORDER — CHLORHEXIDINE GLUCONATE CLOTH 2 % EX PADS: 6.0000 | MEDICATED_PAD | Freq: Once | CUTANEOUS | Status: DC

## 2019-04-12 MED ORDER — FENTANYL CITRATE (PF) 100 MCG/2ML IJ SOLN
INTRAMUSCULAR | Status: DC | PRN
  Administered 2019-04-12: 100 ug via INTRAVENOUS

## 2019-04-12 MED ORDER — OXYCODONE HCL 5 MG PO TABS
5.0000 mg | ORAL_TABLET | Freq: Once | ORAL | Status: AC | PRN
  Administered 2019-04-12: 5 mg via ORAL

## 2019-04-12 MED ORDER — LACTATED RINGERS IV SOLN: INTRAVENOUS | Status: DC | PRN

## 2019-04-12 MED ORDER — PROPOFOL 10 MG/ML IV BOLUS
INTRAVENOUS | Status: AC
  Filled 2019-04-12: qty 40

## 2019-04-12 MED ORDER — OXYCODONE HCL 5 MG/5ML PO SOLN: 5.0000 mg | Freq: Once | ORAL | Status: AC | PRN

## 2019-04-12 MED ORDER — 0.9 % SODIUM CHLORIDE (POUR BTL) OPTIME
TOPICAL | Status: DC | PRN
  Administered 2019-04-12: 1000 mL

## 2019-04-12 MED ORDER — EPINEPHRINE PF 1 MG/ML IJ SOLN
INTRAMUSCULAR | Status: AC
  Filled 2019-04-12: qty 1

## 2019-04-12 MED ORDER — MIDAZOLAM HCL 2 MG/2ML IJ SOLN
INTRAMUSCULAR | Status: AC
  Filled 2019-04-12: qty 2

## 2019-04-12 MED ORDER — LIDOCAINE 2% (20 MG/ML) 5 ML SYRINGE
INTRAMUSCULAR | Status: DC | PRN
  Administered 2019-04-12: 60 mg via INTRAVENOUS

## 2019-04-12 MED ORDER — EPINEPHRINE HCL (NASAL) 0.1 % NA SOLN
NASAL | Status: AC
  Filled 2019-04-12: qty 30

## 2019-04-12 MED ORDER — ONDANSETRON HCL 4 MG/2ML IJ SOLN
INTRAMUSCULAR | Status: DC | PRN
  Administered 2019-04-12: 4 mg via INTRAVENOUS

## 2019-04-12 MED ORDER — OXYCODONE HCL 5 MG/5ML PO SOLN: 5.0000 mg | Freq: Once | ORAL | Status: DC | PRN

## 2019-04-12 MED ORDER — ACETAMINOPHEN 160 MG/5ML PO SOLN: 325.0000 mg | ORAL | Status: DC | PRN

## 2019-04-12 MED ORDER — EPINEPHRINE HCL (NASAL) 0.1 % NA SOLN
NASAL | Status: DC | PRN
  Administered 2019-04-12: 30 mL via TOPICAL

## 2019-04-12 MED ORDER — PROPOFOL 10 MG/ML IV BOLUS
INTRAVENOUS | Status: DC | PRN
  Administered 2019-04-12: 120 mg via INTRAVENOUS

## 2019-04-12 MED ORDER — SUGAMMADEX SODIUM 200 MG/2ML IV SOLN
INTRAVENOUS | Status: DC | PRN
  Administered 2019-04-12: 200 mg via INTRAVENOUS

## 2019-04-12 MED ORDER — OXYCODONE HCL 5 MG PO TABS
ORAL_TABLET | ORAL | Status: AC
  Filled 2019-04-12: qty 1

## 2019-04-12 MED ORDER — ONDANSETRON HCL 4 MG/2ML IJ SOLN: 4.0000 mg | Freq: Once | INTRAMUSCULAR | Status: DC | PRN

## 2019-04-12 MED ORDER — EPINEPHRINE HCL (NASAL) 0.1 % NA SOLN
NASAL | Status: DC | PRN
  Administered 2019-04-12: 1 [drp] via TOPICAL

## 2019-04-12 MED ORDER — STERILE WATER FOR IRRIGATION IR SOLN
Status: DC | PRN
  Administered 2019-04-12: 1000 mL

## 2019-04-12 MED ORDER — DEXAMETHASONE SODIUM PHOSPHATE 10 MG/ML IJ SOLN
INTRAMUSCULAR | Status: DC | PRN
  Administered 2019-04-12: 10 mg via INTRAVENOUS

## 2019-04-12 MED ORDER — PHENYLEPHRINE HCL-NACL 10-0.9 MG/250ML-% IV SOLN
INTRAVENOUS | Status: DC | PRN
  Administered 2019-04-12: 40 ug/min via INTRAVENOUS

## 2019-04-12 MED ORDER — ROCURONIUM BROMIDE 10 MG/ML (PF) SYRINGE
PREFILLED_SYRINGE | INTRAVENOUS | Status: DC | PRN
  Administered 2019-04-12: 40 mg via INTRAVENOUS

## 2019-04-12 MED ORDER — MEPERIDINE HCL 25 MG/ML IJ SOLN: 6.2500 mg | INTRAMUSCULAR | Status: DC | PRN

## 2019-04-12 MED ORDER — EPHEDRINE SULFATE 50 MG/ML IJ SOLN
INTRAMUSCULAR | Status: DC | PRN
  Administered 2019-04-12 (×2): 10 mg via INTRAVENOUS

## 2019-04-12 SURGICAL SUPPLY — 30 items
BALLN PULM 15 16.5 18 X 75CM (BALLOONS)
BALLN PULM 15 16.5 18X75 (BALLOONS)
BALLOON PULM 15 16.5 18X75 (BALLOONS) IMPLANT
BLADE SURG 15 STRL LF DISP TIS (BLADE) IMPLANT
BLADE SURG 15 STRL SS (BLADE)
BNDG EYE OVAL (GAUZE/BANDAGES/DRESSINGS) ×16 IMPLANT
CANISTER SUCT 3000ML PPV (MISCELLANEOUS) ×4 IMPLANT
COVER BACK TABLE 60X90IN (DRAPES) ×4 IMPLANT
COVER WAND RF STERILE (DRAPES) IMPLANT
DRAPE HALF SHEET 40X57 (DRAPES) ×4 IMPLANT
GAUZE SPONGE 4X4 12PLY STRL (GAUZE/BANDAGES/DRESSINGS) ×4 IMPLANT
GLOVE SS BIOGEL STRL SZ 7.5 (GLOVE) ×2 IMPLANT
GLOVE SUPERSENSE BIOGEL SZ 7.5 (GLOVE) ×2
GUARD TEETH (MISCELLANEOUS) IMPLANT
KIT TURNOVER KIT B (KITS) ×4 IMPLANT
MARKER SKIN DUAL TIP RULER LAB (MISCELLANEOUS) IMPLANT
NEEDLE HYPO 25GX1X1/2 BEV (NEEDLE) IMPLANT
NS IRRIG 1000ML POUR BTL (IV SOLUTION) ×4 IMPLANT
PAD ARMBOARD 7.5X6 YLW CONV (MISCELLANEOUS) ×8 IMPLANT
PATTIES SURGICAL .5 X3 (DISPOSABLE) ×4 IMPLANT
SOL ANTI FOG 6CC (MISCELLANEOUS) ×2 IMPLANT
SOLUTION ANTI FOG 6CC (MISCELLANEOUS) ×2
SPECIMEN JAR SMALL (MISCELLANEOUS) ×4 IMPLANT
SPONGE INTESTINAL PEANUT (DISPOSABLE) IMPLANT
SURGILUBE 2OZ TUBE FLIPTOP (MISCELLANEOUS) ×4 IMPLANT
TOWEL GREEN STERILE FF (TOWEL DISPOSABLE) ×8 IMPLANT
TRAP SPECIMEN MUCOUS 40CC (MISCELLANEOUS) IMPLANT
TUBE CONNECTING 12'X1/4 (SUCTIONS) ×2
TUBE CONNECTING 12X1/4 (SUCTIONS) ×6 IMPLANT
WATER STERILE IRR 1000ML POUR (IV SOLUTION) ×4 IMPLANT

## 2019-04-12 NOTE — Anesthesia Procedure Notes (Signed)
Procedure Name: Intubation Date/Time: 04/12/2019 7:43 AM Performed by: Imagene Riches, CRNA Pre-anesthesia Checklist: Patient identified, Emergency Drugs available, Suction available and Patient being monitored Patient Re-evaluated:Patient Re-evaluated prior to induction Oxygen Delivery Method: Circle System Utilized Preoxygenation: Pre-oxygenation with 100% oxygen Induction Type: IV induction Ventilation: Two handed mask ventilation required and Oral airway inserted - appropriate to patient size Laryngoscope Size: Glidescope and 3 Grade View: Grade I Tube type: Oral Laser Tube: Laser Tube and Cuffed inflated with minimal occlusive pressure - saline Tube size: 5.5 mm Number of attempts: 1 Airway Equipment and Method: Stylet and Oral airway Placement Confirmation: ETT inserted through vocal cords under direct vision,  positive ETCO2 and breath sounds checked- equal and bilateral Tube secured with: Tape Dental Injury: Teeth and Oropharynx as per pre-operative assessment

## 2019-04-12 NOTE — Progress Notes (Signed)
Called Dr. Fransisco Beau and informed of patient's low BP.  No order received.

## 2019-04-12 NOTE — Discharge Instructions (Signed)
Continue with your regular meds Tylenol or ibuprofen prn pain Rest voice  Call office for follow up appt in 10-14 days       [phone]

## 2019-04-12 NOTE — Op Note (Signed)
NAMEWINSON, OLCOTT MEDICAL RECORD [id number] ACCOUNT [id number] DATE OF BIRTH:10/27/1950 FACILITY: [facility] LOCATION: [facility] PHYSICIAN:Mahli Glahn Lincoln Maxin, MD  OPERATIVE REPORT  DATE OF PROCEDURE:  04/12/2019  PREOPERATIVE DIAGNOSIS:  Left vocal cord cancer T1N0.  POSTOPERATIVE DIAGNOSIS:  Left vocal cord cancer T1N0.  OPERATION PERFORMED:  CO2 laser excision of left vocal cord cancer.  SURGEON:  Melony Overly, MD  ANESTHESIA:  General endotracheal.  COMPLICATIONS:  None.  BRIEF CLINICAL NOTE:  The patient is a 69 year old gentleman who had a large exophytic mass excised from his vocal cords approximately 4 weeks ago.  Final pathology on this revealed vocal cord cancer.  The patient was taken back to the operating room at  this time for reexcision of this area as previous excision cancer was adjacent to excised area.  On exam, the vocal cord actually looked relatively clear.  Slight irregularity of the left vocal cord, but no gross nodules or polyps or ulcers noted.  The  previous area of the vocal cord where the cancer was located was reexcised taking approximately another 4-5 mm of vocal cord involving the anterior 1/2 of the vocal cord up to the anterior commissure, but the commissure was not involved and the  commissure was not violated with CO2 laser.  Hemostasis was obtained with the laser as well as cotton soaked in adrenaline.  Photos were obtained status post excision.  The patient tolerated this well and was awoken from anesthesia and transferred to  recovery room postoperatively doing well.  DISPOSITION:  The patient will continue with his antacid therapy that he has previously taken and is discharged home later this morning.  He will follow up in my office in 10 days for recheck.  CN/NUANCE  D:04/12/2019 T:04/12/2019 JOB:[id number]

## 2019-04-12 NOTE — Anesthesia Postprocedure Evaluation (Signed)
Anesthesia Post Note  Patient: David Castaneda  Procedure(s) Performed: Microlaryngoscopy With Co2 Laser And Excision Of Vocal Cord (N/A Throat)     Patient location during evaluation: PACU Anesthesia Type: General Level of consciousness: awake and alert Pain management: pain level controlled Vital Signs Assessment: post-procedure vital signs reviewed and stable Respiratory status: spontaneous breathing, nonlabored ventilation and respiratory function stable Cardiovascular status: blood pressure returned to baseline and stable Postop Assessment: no apparent nausea or vomiting Anesthetic complications: no    Last Vitals:  Vitals:   04/12/19 0853 04/12/19 0907  BP: 99/64 94/63  Pulse: 64 63  Resp: 15 14  Temp:  36.6 C  SpO2: 96% 96%    Last Pain:  Vitals:   04/12/19 0851  TempSrc:   PainSc: [place] [name]

## 2019-04-12 NOTE — Interval H&P Note (Signed)
History and Physical Interval Note:  04/12/2019 7:30 AM  David Castaneda  has presented today for surgery, with the diagnosis of VOCAL CORD CANCER.  The various methods of treatment have been discussed with the patient and family. After consideration of risks, benefits and other options for treatment, the patient has consented to  Procedure(s): MICROLARYNGOSCOPY WITH EXCISION OF VOCAL CORD CANCER (N/A) as a surgical intervention.  The patient's history has been reviewed, patient examined, no change in status, stable for surgery.  I have reviewed the patient's chart and labs.  Questions were answered to the patient's satisfaction.     Melony Overly

## 2019-04-12 NOTE — Brief Op Note (Signed)
04/12/2019  8:41 AM  PATIENT:  David Castaneda  69 y.o. male  PRE-OPERATIVE DIAGNOSIS:  VOCAL CORD CANCER  POST-OPERATIVE DIAGNOSIS:  * No post-op diagnosis entered *  PROCEDURE:  Procedure(s): MICROLARYNGOSCOPY WITH EXCISION OF VOCAL CORD CANCER (N/A)  SURGEON:  Surgeon(s) and Role:    * Rozetta Nunnery, MD - Primary  PHYSICIAN ASSISTANT:   ASSISTANTS: none   ANESTHESIA:   general  EBL:  2 mL   BLOOD ADMINISTERED:none  DRAINS: none   LOCAL MEDICATIONS USED:  NONE  SPECIMEN:  Source of Specimen:  left vocal cord  DISPOSITION OF SPECIMEN:  PATHOLOGY  COUNTS:  YES  TOURNIQUET:  * No tourniquets in log *  DICTATION: .Other Dictation: Dictation Number [id number]  PLAN OF CARE: Discharge to home after PACU  PATIENT DISPOSITION:  PACU - hemodynamically stable.   Delay start of Pharmacological VTE agent (>24hrs) due to surgical blood loss or risk of bleeding: yes

## 2019-04-12 NOTE — Transfer of Care (Signed)
Immediate Anesthesia Transfer of Care Note  Patient: David Castaneda  Procedure(s) Performed: MICROLARYNGOSCOPY WITH EXCISION OF VOCAL CORD CANCER (N/A )  Patient Location: PACU  Anesthesia Type:General  Level of Consciousness: awake, alert  and oriented  Airway & Oxygen Therapy: Patient Spontanous Breathing and Patient connected to nasal cannula oxygen  Post-op Assessment: Report given to RN and Post -op Vital signs reviewed and stable  Post vital signs: Reviewed and stable  Last Vitals:  Vitals Value Taken Time  BP 101/58 04/12/19 0838  Temp    Pulse 62 04/12/19 0837  Resp 12 04/12/19 0837  SpO2 98 % 04/12/19 0837  Vitals shown include unvalidated device data.  Last Pain:  Vitals:   04/12/19 0625  TempSrc:   PainSc: 0-No pain         Complications: No apparent anesthesia complications

## 2019-04-15 LAB — SURGICAL PATHOLOGY

## 2019-04-22 ENCOUNTER — Ambulatory Visit (INDEPENDENT_AMBULATORY_CARE_PROVIDER_SITE_OTHER): Payer: Medicare Other | Admitting: Otolaryngology

## 2019-04-22 ENCOUNTER — Other Ambulatory Visit: Payer: Self-pay

## 2019-04-22 ENCOUNTER — Encounter (INDEPENDENT_AMBULATORY_CARE_PROVIDER_SITE_OTHER): Payer: Self-pay | Admitting: Otolaryngology

## 2019-04-22 VITALS — Temp 98.2°F

## 2019-04-22 DIAGNOSIS — Z4889 Encounter for other specified surgical aftercare: Secondary | ICD-10-CM

## 2019-04-22 NOTE — Progress Notes (Signed)
HPI: David Castaneda is a 69 y.o. male who presents 10 days s/p CO2 laser excision of left vocal cord carcinoma.  He is doing well still has a little bit of a sore throat but this is getting better.  Final pathology report revealed no residual carcinoma identified..   Past Medical History:  Diagnosis Date  . CHF (congestive heart failure) ([place])   . Former smoker   . History of kidney stones   . OSA (obstructive sleep apnea) 06/04/2018   Severe obstructive sleep apnea with an AHI of 93/h with oxygen desaturations as low as 81%. Now on CPAP at 11 cm H2O.   . Sucking chest wound    1970's [place]   Past Surgical History:  Procedure Laterality Date  . CARDIAC CATHETERIZATION     patient states they went in and looked around but never placed a stent  . DENTAL SURGERY    . MICROLARYNGOSCOPY WITH CO2 LASER AND EXCISION OF VOCAL CORD LESION N/A 03/15/2019   Procedure: MICROLARYNGOSCOPY WITH POSSIBLE CO2 LASER AND EXCISION OF VOCAL CORD LESION;  Surgeon: Rozetta Nunnery, MD;  Location: [place];  Service: ENT;  Laterality: N/A;  . MICROLARYNGOSCOPY WITH CO2 LASER AND EXCISION OF VOCAL CORD LESION N/A 04/12/2019   Procedure: Microlaryngoscopy With Co2 Laser And Excision Of Vocal Cord;  Surgeon: Rozetta Nunnery, MD;  Location: [place];  Service: ENT;  Laterality: N/A;  . wound     wound in back in war   Social History   Socioeconomic History  . Marital status: Divorced    Spouse name: Not on file  . Number of children: Not on file  . Years of education: Not on file  . Highest education level: Not on file  Occupational History  . Not on file  Tobacco Use  . Smoking status: Former Smoker    Packs/day: 1.00    Years: 50.00    Pack years: 50.00    Start date: 1968    Quit date: 04/04/2016    Years since quitting: 3.0  . Smokeless tobacco: Never Used  Substance and Sexual Activity  . Alcohol use: Yes    Comment: social  . Drug use: Never    Comment: remotely in the 1970's  . Sexual  activity: Not on file  Other Topics Concern  . Not on file  Social History Narrative  . Not on file   Social Determinants of Health   Financial Resource Strain:   . Difficulty of Paying Living Expenses: Not on file  Food Insecurity:   . Worried About Charity fundraiser in the Last Year: Not on file  . Ran Out of Food in the Last Year: Not on file  Transportation Needs:   . Lack of Transportation (Medical): Not on file  . Lack of Transportation (Non-Medical): Not on file  Physical Activity:   . Days of Exercise per Week: Not on file  . Minutes of Exercise per Session: Not on file  Stress:   . Feeling of Stress : Not on file  Social Connections:   . Frequency of Communication with Friends and Family: Not on file  . Frequency of Social Gatherings with Friends and Family: Not on file  . Attends Religious Services: Not on file  . Active Member of Clubs or Organizations: Not on file  . Attends Archivist Meetings: Not on file  . Marital Status: Not on file   Family History  Problem Relation Age of Onset  .  Dementia Mother   . Heart disease Father   . Lung cancer Brother        SCLC   No Known Allergies Prior to Admission medications   Medication Sig Start Date End Date Taking? Authorizing Provider  acetaminophen (TYLENOL) 500 MG tablet Take 500 mg by mouth every 6 (six) hours as needed for moderate pain or headache.    Yes [provider]  aspirin 81 MG chewable tablet Chew 81 mg by mouth 2 (two) times daily.   Yes [provider]  carvedilol (COREG) 6.25 MG tablet Take 1 tablet (6.25 mg total) by mouth 2 (two) times daily. 02/11/19 02/11/20 Yes Larey Dresser, MD  ENTRESTO 24-26 MG TAKE 1 TABLET BY MOUTH TWICE A DAY Patient taking differently: Take 1 tablet by mouth 2 (two) times daily.  11/19/18  Yes Larey Dresser, MD  famotidine (PEPCID) 20 MG tablet Take 1 tablet (20 mg total) by mouth at bedtime. Patient taking differently: Take 20 mg by  mouth 2 (two) times daily.  10/19/17  Yes Regalado, Belkys A, MD  furosemide (LASIX) 40 MG tablet TAKE 1.5 TABLET BY MOUTH EVERY MORNING AND 1 TABLET EVERY EVENING. Patient taking differently: Take 40-60 mg by mouth See admin instructions. Take 1.5 tablets (60 mg) by mouth in the morning & take 1 tablet (40 mg) by mouth in evening. 12/18/18  Yes Larey Dresser, MD  oxymetazoline (AFRIN) 0.05 % nasal spray Place 1 spray into both nostrils 2 (two) times daily as needed for congestion.   Yes [provider]  spironolactone (ALDACTONE) 25 MG tablet TAKE 1 TABLET BY MOUTH EVERY DAY Patient taking differently: Take 25 mg by mouth daily.  03/13/19  Yes Larey Dresser, MD     Physical Exam: FOL in the office today revealed well-healing left vocal cord excision with no granulation tissue or polypoid disease.  Right vocal cord is normal with normal mobility bilaterally.   Assessment: S/p T1N0 squamous cell carcinoma of left true vocal cord status post CO2 laser excision  Plan: He will follow-up in 3 months for recheck   Radene Journey, MD

## 2019-06-06 DIAGNOSIS — Z23 Encounter for immunization: Secondary | ICD-10-CM | POA: Diagnosis not present

## 2019-07-08 ENCOUNTER — Other Ambulatory Visit (HOSPITAL_COMMUNITY): Payer: Self-pay | Admitting: Cardiology

## 2019-07-12 DIAGNOSIS — Z23 Encounter for immunization: Secondary | ICD-10-CM | POA: Diagnosis not present

## 2019-07-19 ENCOUNTER — Other Ambulatory Visit: Payer: Self-pay

## 2019-07-19 ENCOUNTER — Ambulatory Visit (INDEPENDENT_AMBULATORY_CARE_PROVIDER_SITE_OTHER): Payer: Medicare Other | Admitting: Otolaryngology

## 2019-07-19 ENCOUNTER — Encounter (INDEPENDENT_AMBULATORY_CARE_PROVIDER_SITE_OTHER): Payer: Self-pay | Admitting: Otolaryngology

## 2019-07-19 VITALS — Temp 97.9°F

## 2019-07-19 DIAGNOSIS — Z8521 Personal history of malignant neoplasm of larynx: Secondary | ICD-10-CM

## 2019-07-19 NOTE — Progress Notes (Signed)
HPI: David Castaneda is a 69 y.o. 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  He has been doing well with good voice and no problems.  Presents today for fiberoptic laryngoscopy..  Past Medical History:  Diagnosis Date  . CHF (congestive heart failure) ([place])   . Former smoker   . History of kidney stones   . OSA (obstructive sleep apnea) 06/04/2018   Severe obstructive sleep apnea with an AHI of 93/h with oxygen desaturations as low as 81%. Now on CPAP at 11 cm H2O.   . Sucking chest wound    1970's [place]   Past Surgical History:  Procedure Laterality Date  . CARDIAC CATHETERIZATION     patient states they went in and looked around but never placed a stent  . DENTAL SURGERY    . MICROLARYNGOSCOPY WITH CO2 LASER AND EXCISION OF VOCAL CORD LESION N/A 03/15/2019   Procedure: MICROLARYNGOSCOPY WITH POSSIBLE CO2 LASER AND EXCISION OF VOCAL CORD LESION;  Surgeon: Rozetta Nunnery, MD;  Location: [facility];  Service: ENT;  Laterality: N/A;  . MICROLARYNGOSCOPY WITH CO2 LASER AND EXCISION OF VOCAL CORD LESION N/A 04/12/2019   Procedure: Microlaryngoscopy With Co2 Laser And Excision Of Vocal Cord;  Surgeon: Rozetta Nunnery, MD;  Location: [facility];  Service: ENT;  Laterality: N/A;  . wound     wound in back in war   Social History   Socioeconomic History  . Marital status: Divorced    Spouse name: Not on file  . Number of children: Not on file  . Years of education: Not on file  . Highest education level: Not on file  Occupational History  . Not on file  Tobacco Use  . Smoking status: Former Smoker    Packs/day: 1.00    Years: 50.00    Pack years: 50.00    Start date: 1968    Quit date: 04/04/2016    Years since quitting: 3.2  . Smokeless tobacco: Never Used  Substance and Sexual Activity  . Alcohol use: Yes    Comment: social  . Drug use: Never    Comment: remotely in the 1970's  .  Sexual activity: Not on file  Other Topics Concern  . Not on file  Social History Narrative  . Not on file   Social Determinants of Health   Financial Resource Strain:   . Difficulty of Paying Living Expenses:   Food Insecurity:   . Worried About Charity fundraiser in the Last Year:   . Arboriculturist in the Last Year:   Transportation Needs:   . Film/video editor (Medical):   Marland Kitchen Lack of Transportation (Non-Medical):   Physical Activity:   . Days of Exercise per Week:   . Minutes of Exercise per Session:   Stress:   . Feeling of Stress :   Social Connections:   . Frequency of Communication with Friends and Family:   . Frequency of Social Gatherings with Friends and Family:   . Attends Religious Services:   . Active Member of Clubs or Organizations:   . Attends Archivist Meetings:   Marland Kitchen Marital Status:    Family History  Problem Relation Age of Onset  . Dementia Mother   . Heart disease Father   . Lung cancer Brother        SCLC   No Known Allergies Prior to  Admission medications   Medication Sig Start Date End Date Taking? Authorizing Provider  acetaminophen (TYLENOL) 500 MG tablet Take 500 mg by mouth every 6 (six) hours as needed for moderate pain or headache.    Yes [provider]  aspirin 81 MG chewable tablet Chew 81 mg by mouth 2 (two) times daily.   Yes [provider]  carvedilol (COREG) 6.25 MG tablet Take 1 tablet (6.25 mg total) by mouth 2 (two) times daily. 02/11/19 02/11/20 Yes Larey Dresser, MD  ENTRESTO 24-26 MG TAKE 1 TABLET BY MOUTH TWICE A DAY 07/08/19  Yes Larey Dresser, MD  famotidine (PEPCID) 20 MG tablet Take 1 tablet (20 mg total) by mouth at bedtime. Patient taking differently: Take 20 mg by mouth 2 (two) times daily.  10/19/17  Yes Regalado, Belkys A, MD  furosemide (LASIX) 40 MG tablet TAKE 1.5 TABLET BY MOUTH EVERY MORNING AND 1 TABLET EVERY EVENING. Patient taking differently: Take 40-60 mg by mouth See admin  instructions. Take 1.5 tablets (60 mg) by mouth in the morning & take 1 tablet (40 mg) by mouth in evening. 12/18/18  Yes Larey Dresser, MD  oxymetazoline (AFRIN) 0.05 % nasal spray Place 1 spray into both nostrils 2 (two) times daily as needed for congestion.   Yes [provider]  spironolactone (ALDACTONE) 25 MG tablet TAKE 1 TABLET BY MOUTH EVERY DAY Patient taking differently: Take 25 mg by mouth daily.  03/13/19  Yes Larey Dresser, MD     Positive ROS: Otherwise negative  All other systems have been reviewed and were otherwise negative with the exception of those mentioned in the HPI and as above.  Physical Exam: Constitutional: Alert, well-appearing, no acute distress Ears: External ears without lesions or tenderness. Ear canals are clear bilaterally with intact, clear TMs.  Nasal: External nose without lesions. Septum midline with mild rhinitis.. Clear nasal passages. Oral: Lips and gums without lesions. Tongue and palate mucosa without lesions. Posterior oropharynx clear. Fiberoptic laryngoscopy was performed through the right nostril.  Nasopharynx was clear.  Base of tongue vallecula and epiglottis were normal.  Vocal cords were clear bilaterally with normal vocal cord mobility.  Anterior commissure was clear. Neck: No palpable adenopathy or masses.  No palpable adenopathy in the neck. Respiratory: Breathing comfortably  Skin: No facial/neck lesions or rash noted.  Laryngoscopy  Date/Time: 07/19/2019 6:00 PM Performed by: Rozetta Nunnery, MD Authorized by: Rozetta Nunnery, MD   Consent:    Consent obtained:  Verbal   Consent given by:  Patient Procedure details:    Indications: direct visualization of the upper aerodigestive tract and oncologic surveillance follow-up     Medication:  Afrin   Instrument: flexible fiberoptic laryngoscope     Scope location: right nare   Mouth:    Vallecula: normal     Base of tongue: normal     Epiglottis: normal    Throat:    True vocal cords: normal   Comments:     On fiberoptic laryngoscopy both vocal cords were clear with no evidence of recurrent lesions.  All cords had normal mobility bilaterally.    Assessment: History of T1N0 left vocal cord cancer status post surgical excision 4 months ago.  Plan: On clinical exam no evidence of persistent or recurrent disease. Patient will follow up in 6 months for recheck.  He will return earlier if he notices any change in his voice.   Radene Journey, MD

## 2019-08-24 ENCOUNTER — Other Ambulatory Visit (HOSPITAL_COMMUNITY): Payer: Self-pay | Admitting: Cardiology

## 2019-09-05 ENCOUNTER — Other Ambulatory Visit (HOSPITAL_COMMUNITY): Payer: Self-pay | Admitting: Cardiology

## 2019-11-05 ENCOUNTER — Ambulatory Visit (INDEPENDENT_AMBULATORY_CARE_PROVIDER_SITE_OTHER): Payer: Medicare Other | Admitting: Otolaryngology

## 2019-12-03 ENCOUNTER — Other Ambulatory Visit (HOSPITAL_COMMUNITY): Payer: Self-pay | Admitting: Cardiology

## 2019-12-05 ENCOUNTER — Other Ambulatory Visit (HOSPITAL_COMMUNITY): Payer: Self-pay | Admitting: Cardiology

## 2019-12-06 ENCOUNTER — Other Ambulatory Visit: Payer: Self-pay

## 2019-12-06 ENCOUNTER — Encounter (INDEPENDENT_AMBULATORY_CARE_PROVIDER_SITE_OTHER): Payer: Self-pay | Admitting: Otolaryngology

## 2019-12-06 ENCOUNTER — Encounter (HOSPITAL_COMMUNITY): Payer: Self-pay | Admitting: Cardiology

## 2019-12-06 ENCOUNTER — Ambulatory Visit (HOSPITAL_COMMUNITY)
Admission: RE | Admit: 2019-12-06 | Discharge: 2019-12-06 | Disposition: A | Discharge status: Home / Self Care | Payer: Medicare Other | Source: Ambulatory Visit | Attending: Cardiology | Admitting: Cardiology

## 2019-12-06 ENCOUNTER — Telehealth (HOSPITAL_COMMUNITY): Payer: Self-pay | Admitting: Pharmacy Technician

## 2019-12-06 ENCOUNTER — Ambulatory Visit (INDEPENDENT_AMBULATORY_CARE_PROVIDER_SITE_OTHER): Payer: Medicare Other | Admitting: Otolaryngology

## 2019-12-06 VITALS — Temp 96.4°F

## 2019-12-06 VITALS — BP 98/72 | HR 68 | Ht 68.0 in | Wt 281.0 lb

## 2019-12-06 DIAGNOSIS — I509 Heart failure, unspecified: Secondary | ICD-10-CM

## 2019-12-06 DIAGNOSIS — K7460 Unspecified cirrhosis of liver: Secondary | ICD-10-CM | POA: Diagnosis not present

## 2019-12-06 DIAGNOSIS — Z79899 Other long term (current) drug therapy: Secondary | ICD-10-CM | POA: Insufficient documentation

## 2019-12-06 DIAGNOSIS — Z7984 Long term (current) use of oral hypoglycemic drugs: Secondary | ICD-10-CM | POA: Insufficient documentation

## 2019-12-06 DIAGNOSIS — I5022 Chronic systolic (congestive) heart failure: Secondary | ICD-10-CM | POA: Diagnosis not present

## 2019-12-06 DIAGNOSIS — G4733 Obstructive sleep apnea (adult) (pediatric): Secondary | ICD-10-CM | POA: Diagnosis not present

## 2019-12-06 DIAGNOSIS — Z8521 Personal history of malignant neoplasm of larynx: Secondary | ICD-10-CM

## 2019-12-06 DIAGNOSIS — I428 Other cardiomyopathies: Secondary | ICD-10-CM | POA: Insufficient documentation

## 2019-12-06 DIAGNOSIS — Z8249 Family history of ischemic heart disease and other diseases of the circulatory system: Secondary | ICD-10-CM | POA: Diagnosis not present

## 2019-12-06 DIAGNOSIS — Z87891 Personal history of nicotine dependence: Secondary | ICD-10-CM | POA: Insufficient documentation

## 2019-12-06 DIAGNOSIS — Z7982 Long term (current) use of aspirin: Secondary | ICD-10-CM | POA: Insufficient documentation

## 2019-12-06 LAB — BASIC METABOLIC PANEL
Anion gap: 9 (ref 5–15)
BUN: 10 mg/dL (ref 8–23)
CO2: 22 mmol/L (ref 22–32)
Calcium: 9 mg/dL (ref 8.9–10.3)
Chloride: 102 mmol/L (ref 98–111)
Creatinine, Ser: 0.72 mg/dL (ref 0.61–1.24)
GFR calc Af Amer: >60 mL/min (ref >60)
GFR calc non Af Amer: >60 mL/min (ref >60)
Glucose, Bld: 104 mg/dL — ABNORMAL HIGH (ref 70–99)
Potassium: 4.5 mmol/L (ref 3.5–5.1)
Sodium: 133 mmol/L — ABNORMAL LOW (ref 135–145)

## 2019-12-06 MED ORDER — FUROSEMIDE 40 MG PO TABS: 60.0000 mg | ORAL_TABLET | Freq: Every morning | ORAL | 3 refills | Status: DC

## 2019-12-06 MED ORDER — DAPAGLIFLOZIN PROPANEDIOL 10 MG PO TABS: 10.0000 mg | ORAL_TABLET | Freq: Every day | ORAL | 3 refills | Status: DC

## 2019-12-06 NOTE — Telephone Encounter (Signed)
Patient was seen in clinic today and started on Farxiga. Current 30 day copay of [place] is $145.52 patient is in the donut hole. Started an application for [employer]. Will send in the application after obtaining signatures.  Will follow up.

## 2019-12-06 NOTE — Patient Instructions (Addendum)
START Farxiga 10mg  daily  DECREASE Lasix 60 mg Daily   Labs done today, your results will be available in MyChart, we will contact you for abnormal readings.  Please follow up with our office in 3 months for an appointment and an echocardiogram  Your physician has requested that you have an echocardiogram. Echocardiography is a painless test that uses sound waves to create images of your heart. It provides your doctor with information about the size and shape of your heart and how well your heart's chambers and valves are working. This procedure takes approximately one hour. There are no restrictions for this procedure.  If you have any questions or concerns before your next appointment please send [place] a message through [place] or call our office at [phone].    TO LEAVE A MESSAGE FOR THE NURSE SELECT OPTION 2, PLEASE LEAVE A MESSAGE INCLUDING: . YOUR NAME . DATE OF BIRTH . CALL BACK NUMBER . REASON FOR CALL**this is important as we prioritize the call backs  [place] AS LONG AS YOU CALL BEFORE 4:00 PM  At the [facility], you and your health needs are our priority. As part of our continuing mission to provide you with exceptional heart care, we have created designated Provider Care Teams. These Care Teams include your primary Cardiologist (physician) and Advanced Practice Providers (APPs- Physician Assistants and Nurse Practitioners) who all work together to provide you with the care you need, when you need it.   You may see any of the following providers on your designated Care Team at your next follow up: Marland Kitchen Dr Glori Bickers . Dr Loralie Champagne . Darrick Grinder, NP . Lyda Jester, PA . Audry Riles, PharmD   Please be sure to bring in all your medications bottles to every appointment.

## 2019-12-06 NOTE — Progress Notes (Signed)
HPI: David Castaneda is a 69 y.o. male who returns today for evaluation of history of a T1 left vocal cord cancer status post laser excision performed in December 2020.  He has had some intermittent hoarseness but has not had any persistent hoarseness.  Denies sore throat.  Returns today for recheck.  He is having no hoarseness in the office today..  Past Medical History:  Diagnosis Date   CHF (congestive heart failure) ([place])    Former smoker    History of kidney stones    OSA (obstructive sleep apnea) 06/04/2018   Severe obstructive sleep apnea with an AHI of 93/h with oxygen desaturations as low as 81%. Now on CPAP at 11 cm H2O.    Sucking chest wound    1970's [place]   Past Surgical History:  Procedure Laterality Date   CARDIAC CATHETERIZATION     patient states they went in and looked around but never placed a stent   DENTAL SURGERY     MICROLARYNGOSCOPY WITH CO2 LASER AND EXCISION OF VOCAL CORD LESION N/A 03/15/2019   Procedure: MICROLARYNGOSCOPY WITH POSSIBLE CO2 LASER AND EXCISION OF VOCAL CORD LESION;  Surgeon: Rozetta Nunnery, MD;  Location: [facility];  Service: ENT;  Laterality: N/A;   MICROLARYNGOSCOPY WITH CO2 LASER AND EXCISION OF VOCAL CORD LESION N/A 04/12/2019   Procedure: Microlaryngoscopy With Co2 Laser And Excision Of Vocal Cord;  Surgeon: Rozetta Nunnery, MD;  Location: [facility];  Service: ENT;  Laterality: N/A;   wound     wound in back in war   Social History   Socioeconomic History   Marital status: Divorced    Spouse name: Not on file   Number of children: Not on file   Years of education: Not on file   Highest education level: Not on file  Occupational History   Not on file  Tobacco Use   Smoking status: Former Smoker    Packs/day: 1.00    Years: 50.00    Pack years: 50.00    Start date: 1968    Quit date: 04/04/2016    Years since quitting: 3.6   Smokeless tobacco: Never Used  Vaping Use   Vaping Use: Former  Substance and Sexual  Activity   Alcohol use: Yes    Comment: social   Drug use: Never    Comment: remotely in the 1970's   Sexual activity: Not on file  Other Topics Concern   Not on file  Social History Narrative   Not on file   Social Determinants of Health   Financial Resource Strain:    Difficulty of Paying Living Expenses: Not on file  Food Insecurity:    Worried About Charity fundraiser in the Last Year: Not on file   [place] in the Last Year: Not on file  Transportation Needs:    Lack of Transportation (Medical): Not on file   Lack of Transportation (Non-Medical): Not on file  Physical Activity:    Days of Exercise per Week: Not on file   Minutes of Exercise per Session: Not on file  Stress:    Feeling of Stress : Not on file  Social Connections:    Frequency of Communication with Friends and Family: Not on file   Frequency of Social Gatherings with Friends and Family: Not on file   Attends Religious Services: Not on file   Active Member of Clubs or Organizations: Not on file   Attends Archivist Meetings: Not  on file   Marital Status: Not on file   Family History  Problem Relation Age of Onset   Dementia Mother    Heart disease Father    Lung cancer Brother        SCLC   No Known Allergies Prior to Admission medications   Medication Sig Start Date End Date Taking? Authorizing Provider  acetaminophen (TYLENOL) 500 MG tablet Take 500 mg by mouth every 6 (six) hours as needed for moderate pain or headache.    Yes [provider]  aspirin 81 MG chewable tablet Chew 81 mg by mouth 2 (two) times daily.   Yes [provider]  carvedilol (COREG) 6.25 MG tablet TAKE 1 TABLET BY MOUTH TWICE A DAY 12/05/19  Yes Larey Dresser, MD  dapagliflozin propanediol (FARXIGA) 10 MG TABS tablet Take 1 tablet (10 mg total) by mouth daily before breakfast. 12/06/19  Yes Larey Dresser, MD  ENTRESTO 24-26 MG TAKE 1 TABLET BY MOUTH TWICE A DAY  07/08/19  Yes Larey Dresser, MD  famotidine (PEPCID) 20 MG tablet Take 1 tablet (20 mg total) by mouth at bedtime. Patient taking differently: Take 20 mg by mouth 2 (two) times daily.  10/19/17  Yes Regalado, Belkys A, MD  furosemide (LASIX) 40 MG tablet Take 1.5 tablets (60 mg total) by mouth every morning. 12/06/19  Yes Larey Dresser, MD  oxymetazoline (AFRIN) 0.05 % nasal spray Place 1 spray into both nostrils 2 (two) times daily as needed for congestion.   Yes [provider]  spironolactone (ALDACTONE) 25 MG tablet TAKE 1 TABLET (25 MG TOTAL) BY MOUTH DAILY. NEEDS APPT 12/04/19  Yes Larey Dresser, MD     Positive ROS: Otherwise negative  All other systems have been reviewed and were otherwise negative with the exception of those mentioned in the HPI and as above.  Physical Exam: Constitutional: Alert, well-appearing, no acute distress.  Patient presents with good voice and no hoarseness Ears: External ears without lesions or tenderness. Ear canals are clear bilaterally with intact, clear TMs.  Nasal: External nose without lesions. Septum slightly deviated to the right with mild rhinitis.. Clear nasal passages otherwise. Oral: Lips and gums without lesions. Tongue and palate mucosa without lesions. Posterior oropharynx clear. Fiberoptic laryngoscopy was performed to the left nostril.  The nasopharynx was clear.  The base of tongue vallecula and epiglottis were normal.  The vocal cords were clear bilaterally specifically looking in the anterior commissure in the anterior left true vocal cord this was clear with no evidence of recurrent disease. Neck: No palpable adenopathy or masses Respiratory: Breathing comfortably  Skin: No facial/neck lesions or rash noted.  Laryngoscopy  Date/Time: 12/06/2019 2:20 PM Performed by: Rozetta Nunnery, MD Authorized by: Rozetta Nunnery, MD   Consent:    Consent obtained:  Verbal   Consent given by:  Patient Procedure details:     Indications: evaluation of larynx and immediate subglottis and oncologic surveillance follow-up     Medication:  Afrin   Instrument: flexible fiberoptic laryngoscope     Scope location: left nare   Sinus:    Left nasopharynx: normal   Mouth:    Oropharynx: normal     Vallecula: normal     Base of tongue: normal     Epiglottis: normal   Throat:    True vocal cords: normal   Comments:     On fiberoptic laryngoscopy the vocal cords were clear bilaterally.  Anterior commissure was clear.  Normal vocal cord mobility.    Assessment: History of a T1 left true vocal cord cancer status post laser excision 9 months ago.  No evidence of persistent or recurrent cancer on exam.  Plan: He will follow-up in 6 months for recheck.   Radene Journey, MD

## 2019-12-07 NOTE — Progress Notes (Signed)
PCP: Patient, No Pcp Per Primary Cardiologist: Dr Aundra Dubin   HPI: [name] is a 69 y.o. with history of NICM, smoking, cirrhosis, and systolic heart failure.   Admitted 10/13/2017 with increase SOB/CP. HF team consulted. RHC/LHC as noted below with normal cors and adequate cardiac output. SBP was soft so no BB was added. Placed on low dose losartn and spiro. Discharge weight 260 pounds.   Echo in 11/19 showed EF up to 35%, diffuse hypokinesis.  Echo in 11/20 showed EF up to 45-50% with normal RV.   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   Today he returns for followup of CHF.  He has been doing well symptomatically. No significant exertional dyspnea. No orthopnea/PND.  No lightheadedness.   Weight is up 2 lbs.   Labs (7/19): BNP 43, K 4.1, creatinine 0.91 Labs (11/19): K 4.2, creatinine 0.82 Labs (2/20): K 4.1, creatinine 1.11 Labs (1/21): K 4, creatinine 0.86  ECG (personally reviewed): NSR, 1st degree AVB, poor RWP.   PMH: 1. Chronic systolic CHF: Nonischemic cardiomyopathy.  - LHC/RHC (7/19): No significant coronary disease.  Mean RA 13, PA 44/19, mean PCWP 31, CI 3.48.  - Cardiac MRI (7/19): EF 18%, mild-moderately decreased RV systolic function, ascending aorta 4.4 cm.  - Echo (11/19): EF 35%, diffuse hypokinesis, mild LVH, aortic root 4.3 cm, normal RV size and systolic function.  - Echo (11/20): EF 45-50%, mild LV dilation, normal RV size and systolic function.  2. Dilated aortic root/ascending aorta: 4.4 cm on cardiac MRI 7/19.  - MRA chest (11/20): 4.4 cm aortic root, 4.1 cm ascending aorta.  3. OSA: Severe on 8/19 sleep study. Uses CPAP.  4. Cirrhosis: Viral hepatitis labs negative, no history of heavy ETOH.  5. Nephrolithiasis.  6. 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   ROS: All systems negative except as listed in HPI, PMH and Problem List.  Social History   Socioeconomic History  .  Marital status: Divorced    Spouse name: Not on file  . Number of children: Not on file  . Years of education: Not on file  . Highest education level: Not on file  Occupational History  . Not on file  Tobacco Use  . Smoking status: Former Smoker    Packs/day: 1.00    Years: 50.00    Pack years: 50.00    Start date: 1968    Quit date: 04/04/2016    Years since quitting: 3.6  . Smokeless tobacco: Never Used  Vaping Use  . Vaping Use: Former  Substance and Sexual Activity  . Alcohol use: Yes    Comment: social  . Drug use: Never    Comment: remotely in the 1970's  . Sexual activity: Not on file  Other Topics Concern  . Not on file  Social History Narrative  . Not on file   Social Determinants of Health   Financial Resource Strain:   . Difficulty of Paying Living Expenses: Not on file  Food Insecurity:   . Worried About Charity fundraiser in the Last Year: Not on file  . Ran Out of Food in the Last Year: Not on file  Transportation Needs:   . Lack of Transportation (Medical): Not on file  . Lack of Transportation (Non-Medical): Not on file  Physical Activity:   . Days of Exercise per Week: Not on file  . Minutes of Exercise per Session: Not on file  Stress:   . Feeling of Stress : Not on file  Social Connections:   . Frequency of Communication with Friends and Family: Not on file  . Frequency of Social Gatherings with Friends and Family: Not on file  . Attends Religious Services: Not on file  . Active Member of Clubs or Organizations: Not on file  . Attends Archivist Meetings: Not on file  . Marital Status: Not on file  Intimate Partner Violence:   . Fear of Current or Ex-Partner: Not on file  . Emotionally Abused: Not on file  . Physically Abused: Not on file  . Sexually Abused: Not on file    Family History  Problem Relation Age of Onset  . Dementia Mother   . Heart disease Father   . Lung cancer Brother        SCLC    Past Medical History:   Diagnosis Date  . CHF (congestive heart failure) ([place])   . Former smoker   . History of kidney stones   . OSA (obstructive sleep apnea) 06/04/2018   Severe obstructive sleep apnea with an AHI of 93/h with oxygen desaturations as low as 81%. Now on CPAP at 11 cm H2O.   . Sucking chest wound    1970's [place]    Current Outpatient Medications  Medication Sig Dispense Refill  . acetaminophen (TYLENOL) 500 MG tablet Take 500 mg by mouth every 6 (six) hours as needed for moderate pain or headache.     Marland Kitchen aspirin 81 MG chewable tablet Chew 81 mg by mouth 2 (two) times daily.    . carvedilol (COREG) 6.25 MG tablet TAKE 1 TABLET BY MOUTH TWICE A DAY 180 tablet 0  . dapagliflozin propanediol (FARXIGA) 10 MG TABS tablet Take 1 tablet (10 mg total) by mouth daily before breakfast. 30 tablet 3  . ENTRESTO 24-26 MG TAKE 1 TABLET BY MOUTH TWICE A DAY 180 tablet 3  . famotidine (PEPCID) 20 MG tablet Take 1 tablet (20 mg total) by mouth at bedtime. (Patient taking differently: Take 20 mg by mouth 2 (two) times daily. ) 30 tablet 0  . furosemide (LASIX) 40 MG tablet Take 1.5 tablets (60 mg total) by mouth every morning. 45 tablet 3  . oxymetazoline (AFRIN) 0.05 % nasal spray Place 1 spray into both nostrils 2 (two) times daily as needed for congestion.    Marland Kitchen spironolactone (ALDACTONE) 25 MG tablet TAKE 1 TABLET (25 MG TOTAL) BY MOUTH DAILY. NEEDS APPT 90 tablet 0   No current facility-administered medications for this encounter.    Vitals:   12/06/19 1039  BP: 98/72  Pulse: 68  SpO2: 95%  Weight: 127.5 kg (281 lb)  Height: 5\' 8"  (1.727 m)   Wt Readings from Last 3 Encounters:  12/06/19 127.5 kg (281 lb)  04/12/19 125.6 kg (277 lb)  04/09/19 125.9 kg (277 lb 9 oz)     PHYSICAL EXAM: General: NAD Neck: No JVD, no thyromegaly or thyroid nodule.  Lungs: Clear to auscultation bilaterally with normal respiratory effort. CV: Nondisplaced PMI.  Heart regular S1/S2, no S3/S4, no murmur.  No  peripheral edema.  No carotid bruit.  Normal pedal pulses.  Abdomen: Soft, nontender, no hepatosplenomegaly, no distention.  Skin: Intact without lesions or rashes.  Neurologic: Alert and oriented x 3.  Psych: Normal affect. Extremities: No clubbing or cyanosis.  HEENT: Normal.   ASSESSMENT & PLAN: 1. Chronic systolic CHF: Echo 5/46 with EF 15-20%. Nonischemic cardiomyopathy, no significant CAD on  coronary angiography in 7/19. No strong family history of cardiomyopathy.  No ETOH or drug abuse. Cardiac MRI with EF 18%, no LGE (7/19). RHC (7/19) with preserved cardiac output and elevated filling pressures. Possible viral myocarditis.  Echo in 11/19 showed EF up to 35%.  Echo in 11/20 showed EF 45-50%.  Narrow QRS.  NYHA class I-II symptoms.  He is not volume overloaded on exam.  - Start empagliflozin 10 mg daily.  BMET today and in 10 days. - Decrease Lasix to 60 mg daily.    - Continue spironolactone 25 mg daily.  - Continue Entresto 24/26 bid (BP too low to titrate).  - Continue Coreg 6.25 mg bid (BP too low to titrate).    - EF is now out of ICD range.  - Repeat echo at followup in 3 months.  2. Cirrhosis: Noted on CTA. Viral hepatitis labs negative.  No history of heavy ETOH. Liver MRI showed cirrhosis and liver cysts.   3. OSA: Severe.  Using CPAP.   4. Dilated aortic root: MRA 11/20 showed 4.4 cm aortic root.  - Repeat MRA in 11/21.    Followup 3 months with echo.   Loralie Champagne  12/07/2019

## 2019-12-23 ENCOUNTER — Telehealth (HOSPITAL_COMMUNITY): Payer: Self-pay | Admitting: Pharmacy Technician

## 2019-12-23 IMAGING — DX DG CHEST 2V
2 series · 2 of 2 positions shown · non-contrast
Comparison: None

CLINICAL DATA: 67-year-old male with chest pain.

EXAM:
CHEST - 2 VIEW

[chest lat]
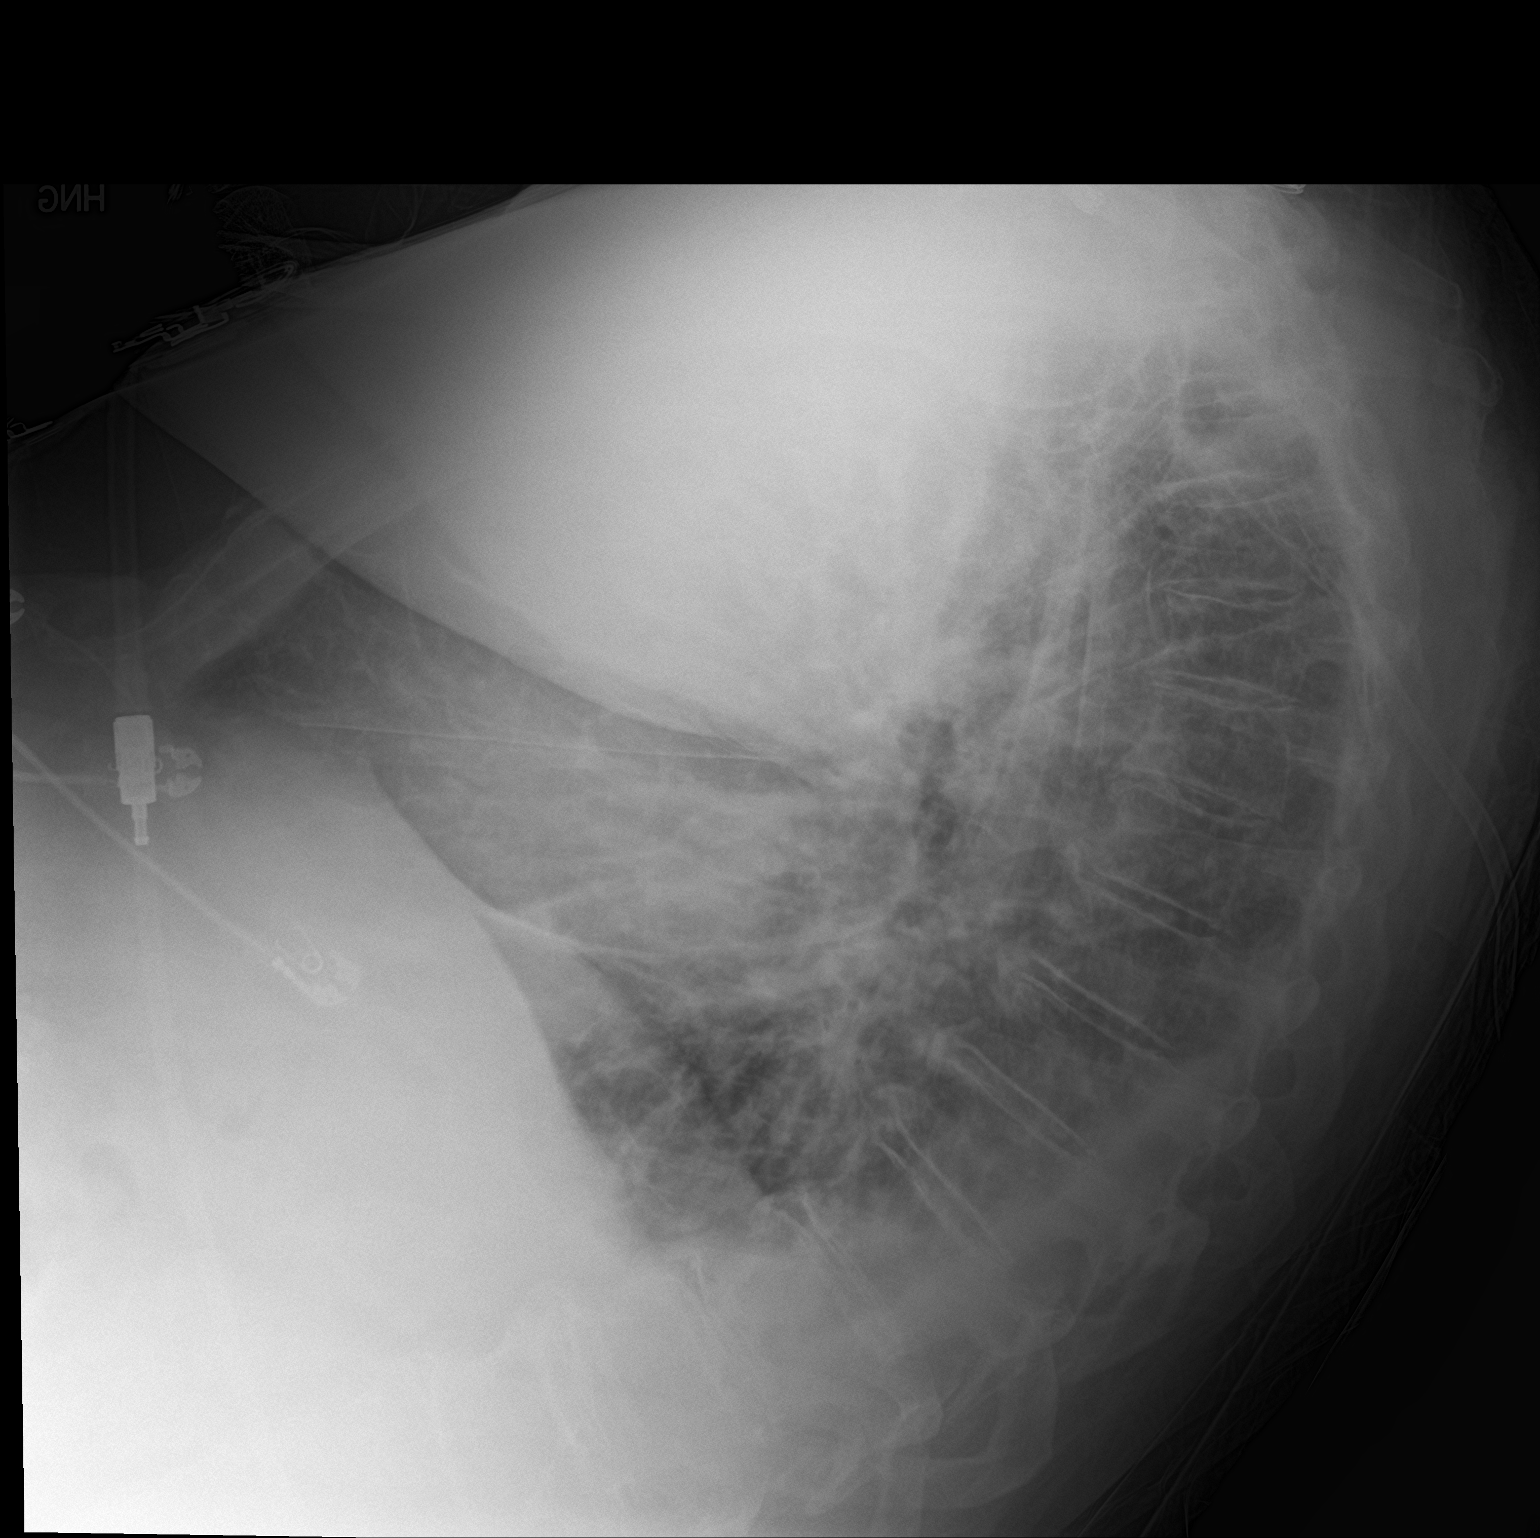

[chest ap]
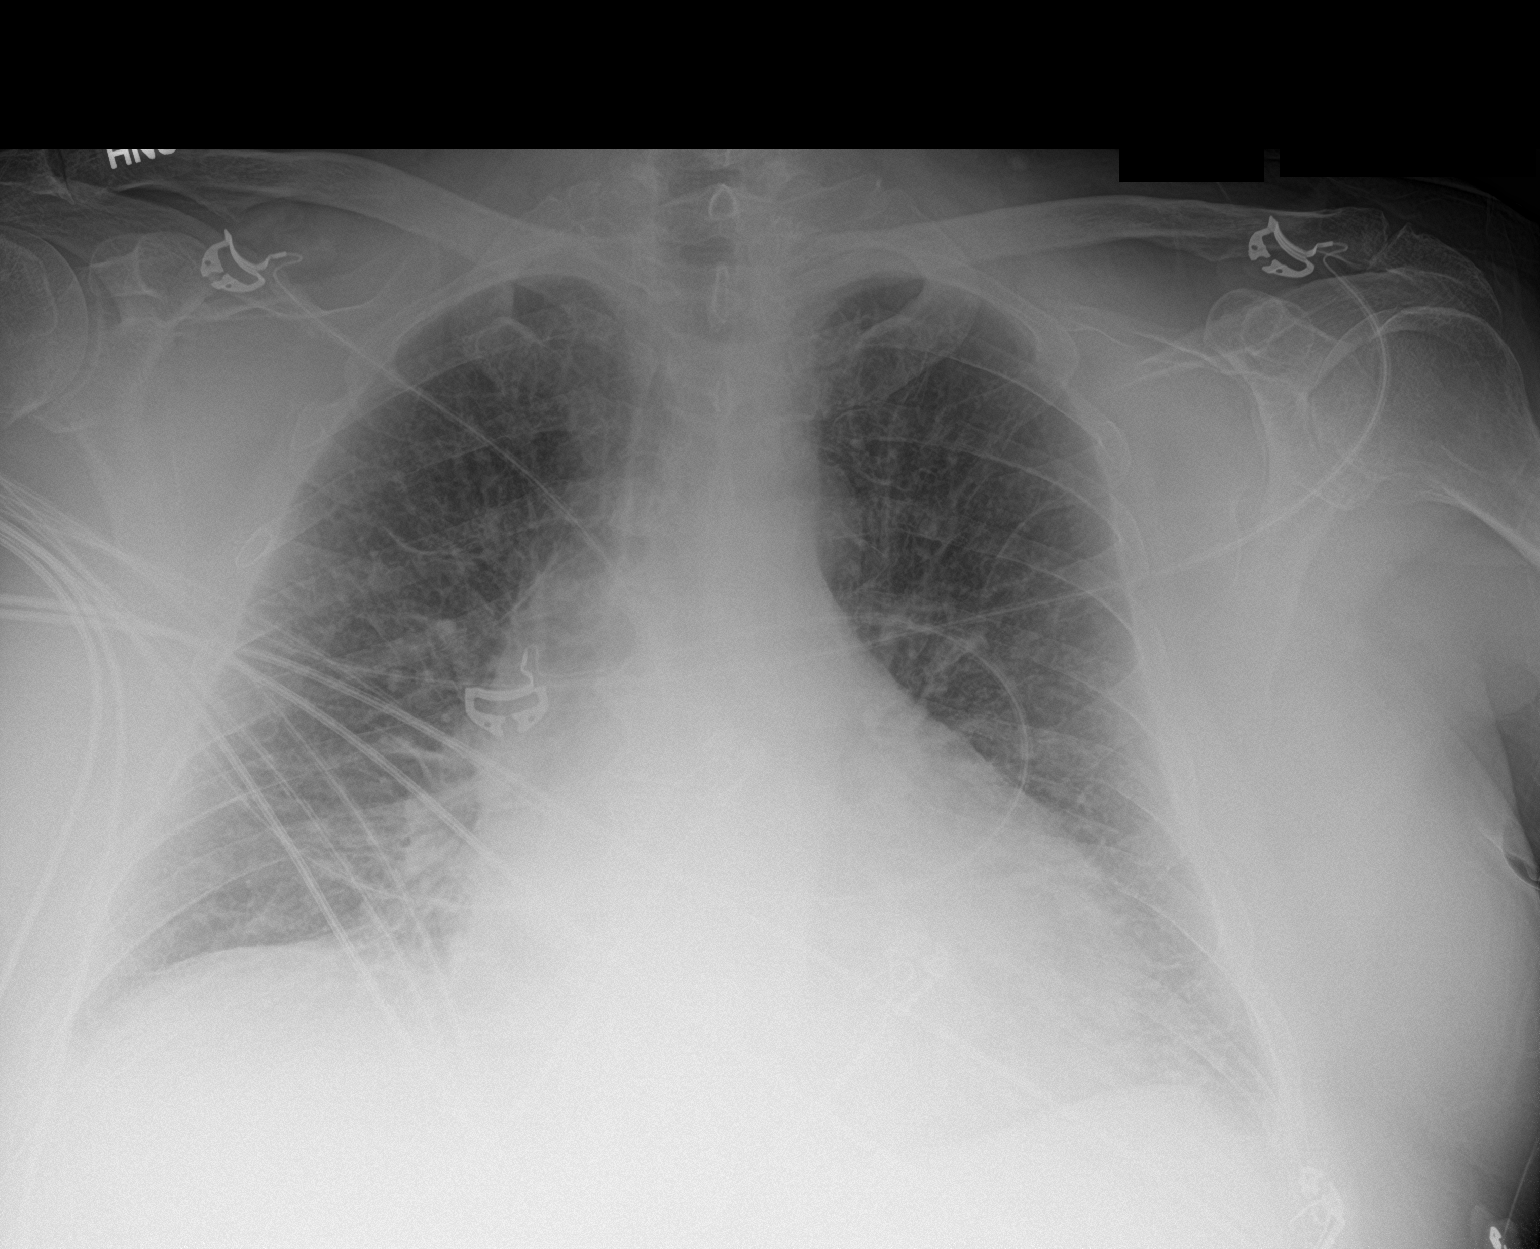

[2 of 2 positions shown; findings below may reference images not displayed]

FINDINGS: There is mild cardiomegaly with mild vascular congestion and small
bilateral pleural effusions. No focal consolidation, or
pneumothorax. No acute osseous pathology.
IMPRESSION: Cardiomegaly with mild CHF and small bilateral pleural effusions.
Clinical correlation is recommended.

## 2019-12-23 NOTE — Telephone Encounter (Signed)
Spoke with David Castaneda. She is going to call the company and get the medication shipped out.  [name], CPhT

## 2019-12-23 NOTE — Telephone Encounter (Signed)
Advanced Heart Failure Patient Advocate Encounter   Patient was approved to receive Farxiga from [employer].  Effective dates: 12/11/19 through 04/03/20  The company has been reaching out to the patient to schedule a shipment of the medication and he has not answered or called back. Called and left a message on Kim's voicemail.  [name], CPhT

## 2019-12-24 IMAGING — CT CT ANGIO CHEST
2 of 7 series · 18 of 46 positions shown · IV contrast (APPLIED)
Comparison: Chest radiograph dated 10/13/2017

CLINICAL DATA: 67-year-old male with positive D-dimer. Concern for
pulmonary embolism.

EXAM:
CT ANGIOGRAPHY CHEST WITH CONTRAST
TECHNIQUE: Multidetector CT imaging of the chest was performed using the
standard protocol during bolus administration of intravenous
contrast. Multiplanar CT image reconstructions and MIPs were
obtained to evaluate the vascular anatomy.
CONTRAST:  100mL DBEEC3-WEI IOPAMIDOL (DBEEC3-WEI) INJECTION 76%

[Series 7: thins · axial · 0.80mm/px · z∈[+1176,+1431]mm · 15 of 410 slices shown]
[im 23/410  lung]
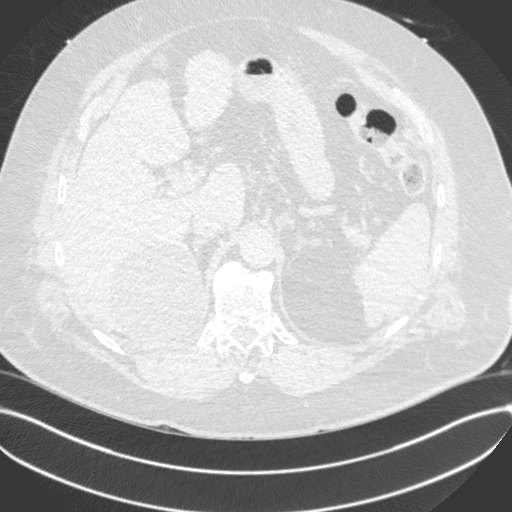
[im 46/410  soft-tissue]
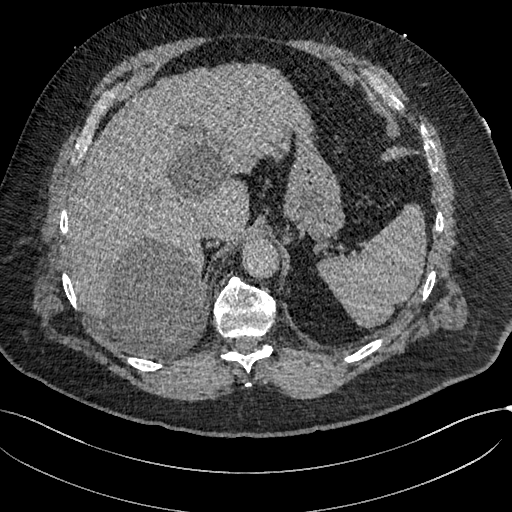
[im 69/410  lung]
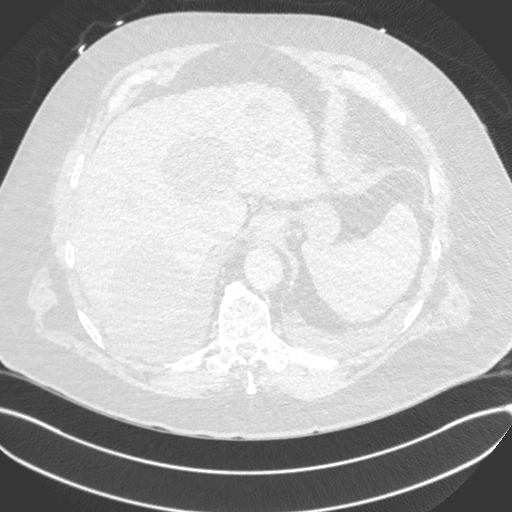
[im 91/410  soft-tissue]
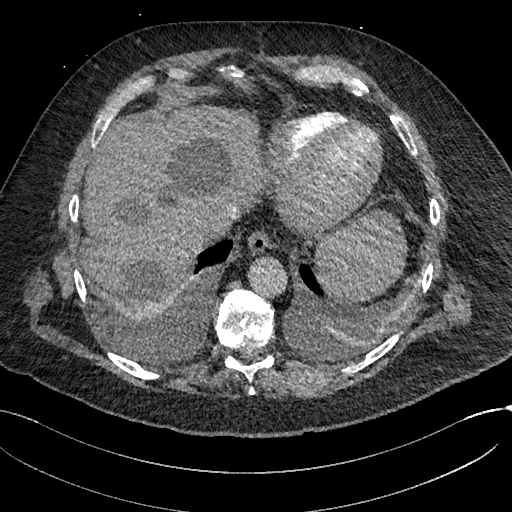
[im 137/410  lung]
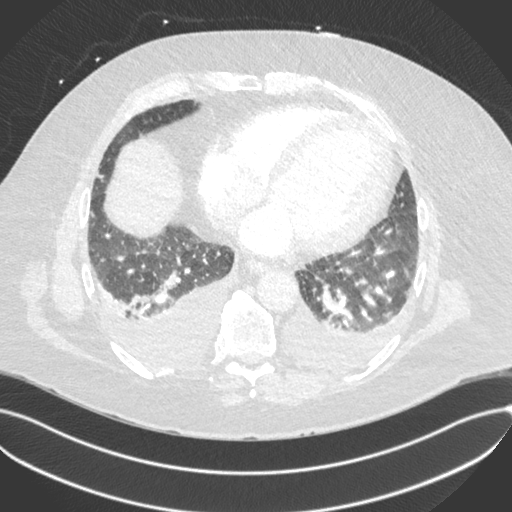
[im 160/410  soft-tissue]
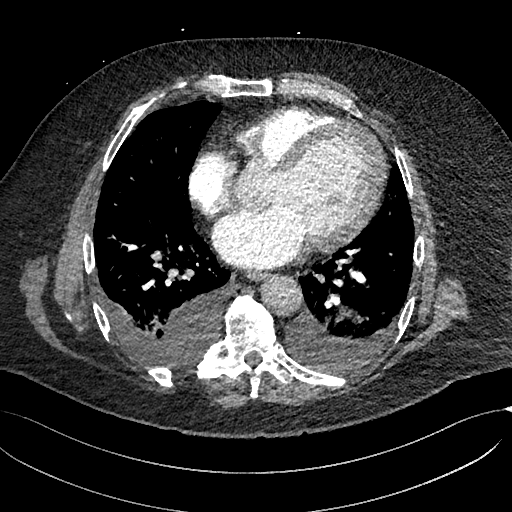
[im 182/410  lung]
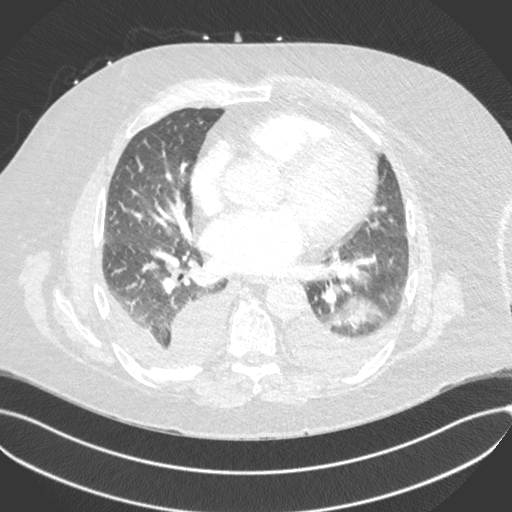
[im 205/410  soft-tissue]
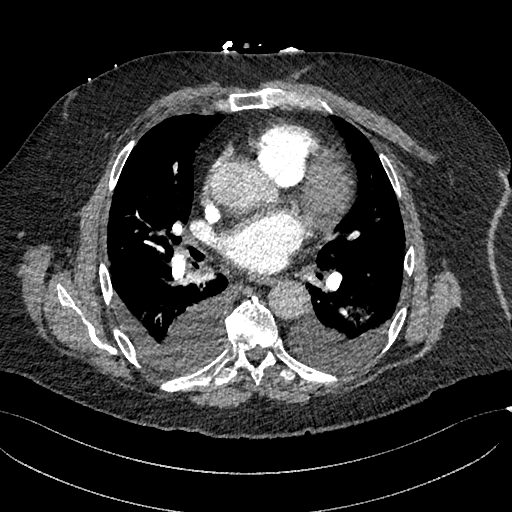
[im 228/410  lung]
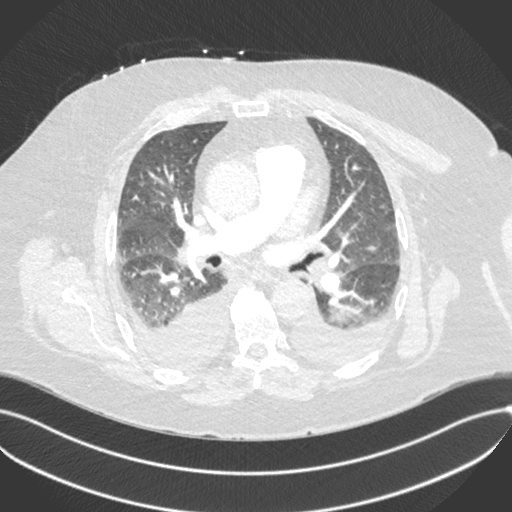
[im 250/410  soft-tissue]
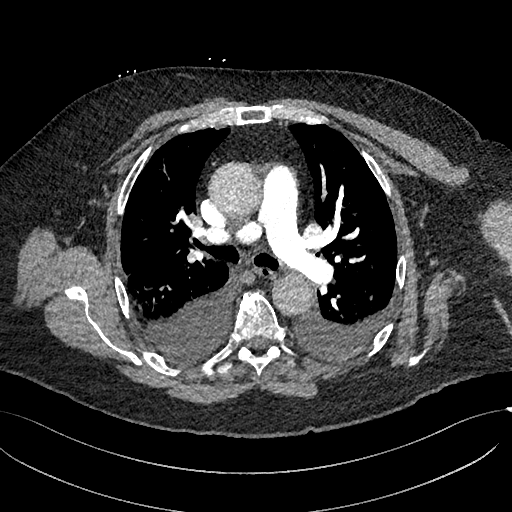
[im 273/410  lung]
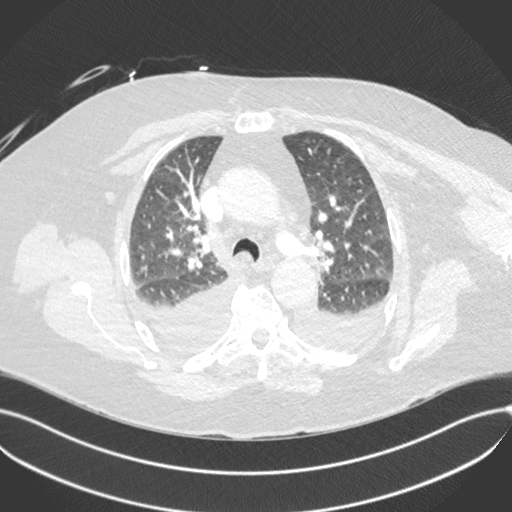
[im 319/410  soft-tissue]
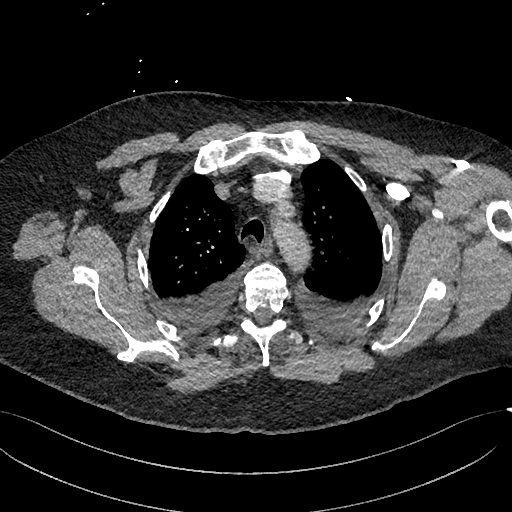
[im 341/410  lung]
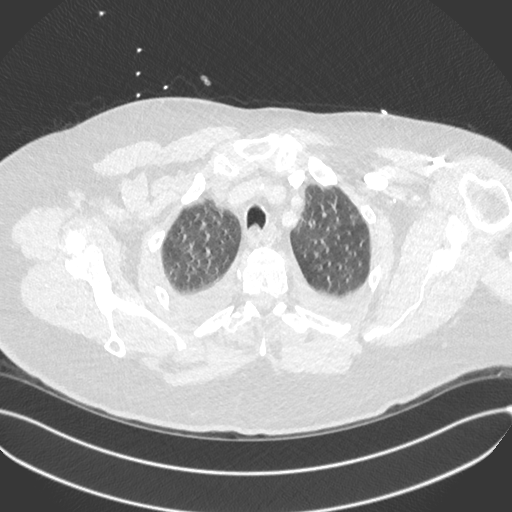
[im 364/410  soft-tissue]
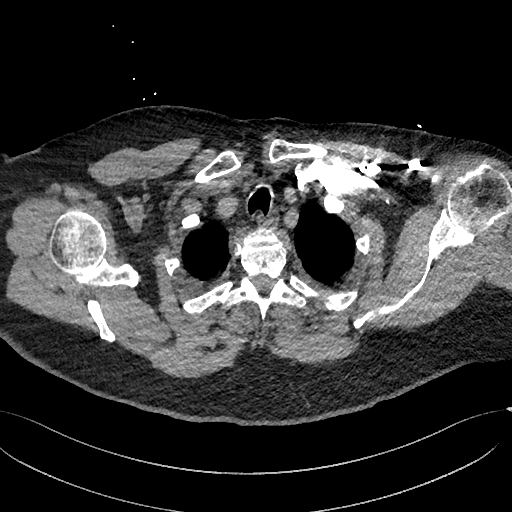
[im 387/410  lung]
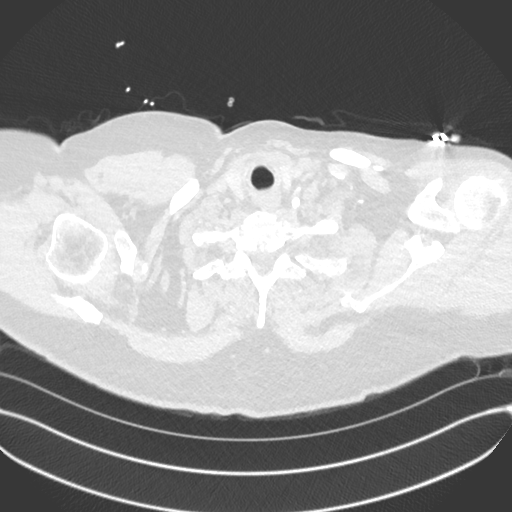

[Series 8: cor · coronal · 0.60mm/px · 3 of 148 slices shown]
[im 37/148  soft-tissue]
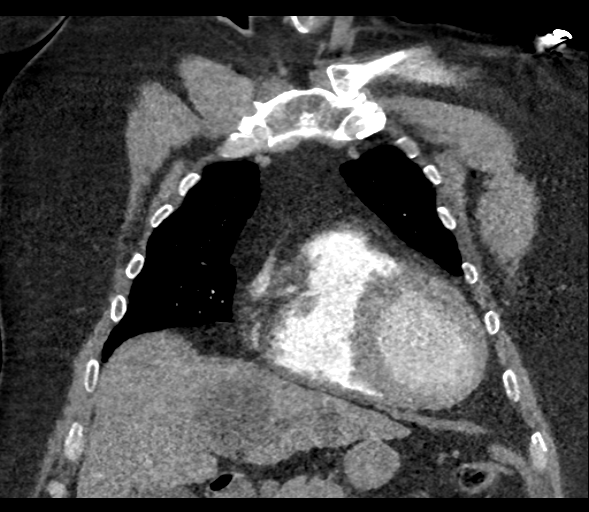
[im 74/148  soft-tissue]
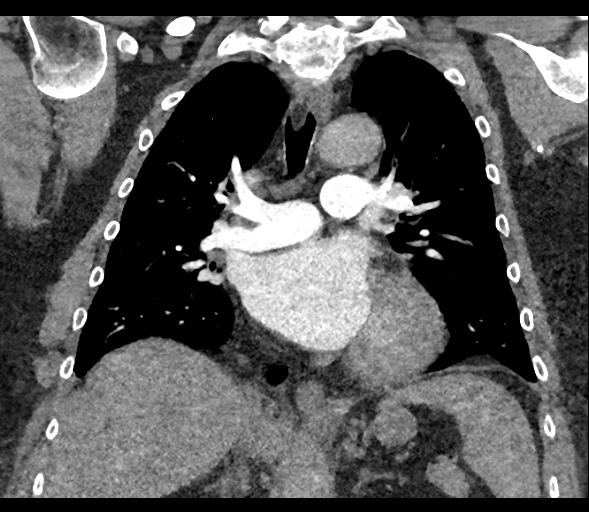
[im 111/148  soft-tissue]
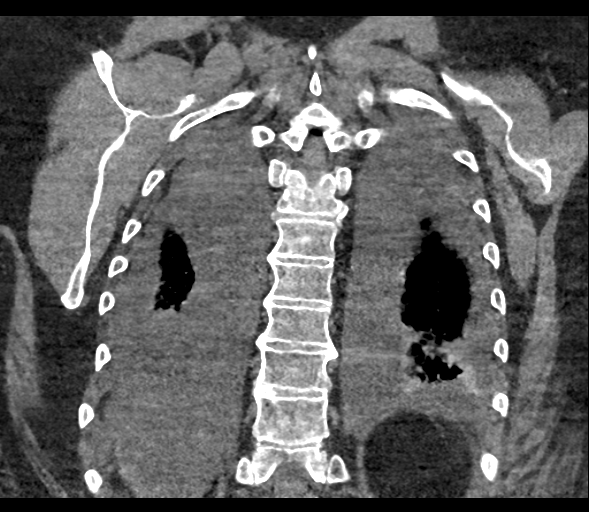

[18 of 46 positions shown; findings below may reference images not displayed]

FINDINGS: Cardiovascular: There is mild cardiomegaly. No pericardial effusion.
The thoracic aorta is unremarkable. There is no CT evidence of
pulmonary embolism.

Mediastinum/Nodes: No hilar or mediastinal adenopathy. Esophagus and
the thyroid gland are grossly unremarkable. No mediastinal fluid
collection.

Lungs/Pleura: Bilateral small to moderate pleural effusions with
associated partial compressive atelectasis of the lower lobes.
Scattered nodular and hazy airspace densities in the left lower lobe
may represent alveolar edema or pneumonia. Clinical correlation is
recommended. There is diffuse interlobular septal prominence and
edema. There is no pneumothorax. The central airways are patent.

Upper Abdomen: Irregular hepatic contour with morphologic changes of
cirrhosis. Multiple hypodense hepatic lesions are not characterized
on this CT. The largest lesion in the right lobe of the liver
measures 8 cm. These lesions demonstrate fluid attenuation, possibly
cysts. However, there is apparent central enhancement or
calcification of lesion in the right lobe of the liver (series 7,
image 329). Further characterization of the liver lesions with MRI
is recommended.

Musculoskeletal: Mild degenerative changes of the spine. No acute
osseous pathology.

Review of the MIP images confirms the above findings.
IMPRESSION: 1. No CT evidence of pulmonary embolism.
2. Mild cardiomegaly with findings of CHF cyst and small to moderate
bilateral pleural effusions.
3. Nodular and hazy airspace densities in the left lower lobe may
represent alveolar edema or pneumonia. Clinical correlation is
recommended.
4. Cirrhosis. Multiple hepatic hypodense lesions are not well
characterized. Further evaluation with MRI recommended.

## 2020-02-11 DIAGNOSIS — Z23 Encounter for immunization: Secondary | ICD-10-CM | POA: Diagnosis not present

## 2020-02-26 ENCOUNTER — Other Ambulatory Visit (HOSPITAL_COMMUNITY): Payer: Self-pay | Admitting: Cardiology

## 2020-03-06 ENCOUNTER — Encounter (HOSPITAL_COMMUNITY): Payer: Medicare Other | Admitting: Cardiology

## 2020-03-06 ENCOUNTER — Other Ambulatory Visit (HOSPITAL_COMMUNITY): Payer: Medicare Other

## 2020-04-20 ENCOUNTER — Telehealth (HOSPITAL_COMMUNITY): Payer: Self-pay | Admitting: Pharmacy Technician

## 2020-04-20 NOTE — Telephone Encounter (Signed)
Advanced Heart Failure Patient Advocate Encounter   Patient was approved to receive Farxiga from [employer]  Patient ID: [id number] Effective dates: 01/17/-22 through 04/03/21  [name], CPhT

## 2020-05-04 ENCOUNTER — Other Ambulatory Visit (HOSPITAL_COMMUNITY): Payer: Self-pay | Admitting: Cardiology

## 2020-05-13 ENCOUNTER — Telehealth (HOSPITAL_COMMUNITY): Payer: Self-pay

## 2020-05-13 NOTE — Telephone Encounter (Deleted)
Advanced Heart Failure Patient Advocate Encounter   Patient was approved to receive Farxiga from [employer]  Patient ID: [id number] Effective dates: 04/20/2020 through 04/03/2021  Left message with Maudie Mercury regarding assistance.   Penni Homans, Student-PharmD

## 2020-05-18 NOTE — Telephone Encounter (Signed)
Encounter opened in error

## 2020-05-18 NOTE — Telephone Encounter (Signed)
Opened in error

## 2020-05-22 ENCOUNTER — Ambulatory Visit (HOSPITAL_BASED_OUTPATIENT_CLINIC_OR_DEPARTMENT_OTHER)
Admission: RE | Admit: 2020-05-22 | Discharge: 2020-05-22 | Disposition: A | Discharge status: Home / Self Care | Payer: Medicare Other | Source: Ambulatory Visit

## 2020-05-22 ENCOUNTER — Other Ambulatory Visit (HOSPITAL_COMMUNITY): Payer: Self-pay | Admitting: Cardiology

## 2020-05-22 ENCOUNTER — Encounter (HOSPITAL_COMMUNITY): Payer: Self-pay | Admitting: Cardiology

## 2020-05-22 ENCOUNTER — Ambulatory Visit (HOSPITAL_COMMUNITY)
Admission: RE | Admit: 2020-05-22 | Discharge: 2020-05-22 | Disposition: A | Discharge status: Home / Self Care | Payer: Medicare Other | Source: Ambulatory Visit | Attending: Cardiology | Admitting: Cardiology

## 2020-05-22 ENCOUNTER — Other Ambulatory Visit: Payer: Self-pay

## 2020-05-22 VITALS — BP 128/88 | HR 68 | Wt 277.6 lb

## 2020-05-22 DIAGNOSIS — I5022 Chronic systolic (congestive) heart failure: Secondary | ICD-10-CM

## 2020-05-22 DIAGNOSIS — Z79899 Other long term (current) drug therapy: Secondary | ICD-10-CM | POA: Diagnosis not present

## 2020-05-22 DIAGNOSIS — G4733 Obstructive sleep apnea (adult) (pediatric): Secondary | ICD-10-CM | POA: Diagnosis not present

## 2020-05-22 DIAGNOSIS — K7460 Unspecified cirrhosis of liver: Secondary | ICD-10-CM | POA: Diagnosis not present

## 2020-05-22 DIAGNOSIS — I5042 Chronic combined systolic (congestive) and diastolic (congestive) heart failure: Secondary | ICD-10-CM | POA: Diagnosis not present

## 2020-05-22 DIAGNOSIS — I712 Thoracic aortic aneurysm, without rupture: Secondary | ICD-10-CM

## 2020-05-22 DIAGNOSIS — Z87891 Personal history of nicotine dependence: Secondary | ICD-10-CM | POA: Diagnosis not present

## 2020-05-22 DIAGNOSIS — I509 Heart failure, unspecified: Secondary | ICD-10-CM | POA: Diagnosis present

## 2020-05-22 DIAGNOSIS — I428 Other cardiomyopathies: Secondary | ICD-10-CM | POA: Insufficient documentation

## 2020-05-22 DIAGNOSIS — I7121 Aneurysm of the ascending aorta, without rupture: Secondary | ICD-10-CM

## 2020-05-22 DIAGNOSIS — I5040 Unspecified combined systolic (congestive) and diastolic (congestive) heart failure: Secondary | ICD-10-CM | POA: Diagnosis not present

## 2020-05-22 LAB — BASIC METABOLIC PANEL
Anion gap: 10 (ref 5–15)
BUN: 10 mg/dL (ref 8–23)
CO2: 22 mmol/L (ref 22–32)
Calcium: 9.2 mg/dL (ref 8.9–10.3)
Chloride: 104 mmol/L (ref 98–111)
Creatinine, Ser: 0.78 mg/dL (ref 0.61–1.24)
GFR, Estimated: >60 mL/min (ref >60)
Glucose, Bld: 85 mg/dL (ref 70–99)
Potassium: 4 mmol/L (ref 3.5–5.1)
Sodium: 136 mmol/L (ref 135–145)

## 2020-05-22 LAB — ECHOCARDIOGRAM COMPLETE
Area-P 1/2: 2.29 cm2
S' Lateral: 3.6 cm

## 2020-05-22 MED ORDER — FUROSEMIDE 40 MG PO TABS: 40.0000 mg | ORAL_TABLET | Freq: Every morning | ORAL | 3 refills | Status: DC

## 2020-05-22 NOTE — Progress Notes (Signed)
  Echocardiogram 2D Echocardiogram has been performed.  Darlina Sicilian M 05/22/2020, 1:54 PM

## 2020-05-22 NOTE — Patient Instructions (Addendum)
Labs done today. We will contact you only if your labs are abnormal.  DECREASE Lasix 40mg  (1 tablet) by mouth daily.  No other medication changes were made. Please continue all current medications as prescribed.  Your physician recommends that you schedule a follow-up appointment in: 3 months for a lab only appointment and in 6 months with our [facility] here in office. Please contact us in July to schedule a August appointment  Your provider has requested that you have a Chest MRA done. This has to be approved through your insurance company prior to scheduling. Once approved, we will contact you to schedule an appointment.   If you have any questions or concerns before your next appointment please send [place] a message through [place] or call our office at [phone].    TO LEAVE A MESSAGE FOR THE NURSE SELECT OPTION 2, PLEASE LEAVE A MESSAGE INCLUDING: . YOUR NAME . DATE OF BIRTH . CALL BACK NUMBER . REASON FOR CALL**this is important as we prioritize the call backs  YOU WILL RECEIVE A CALL BACK THE SAME DAY AS LONG AS YOU CALL BEFORE 4:00 PM   Do the following things EVERYDAY: 1) Weigh yourself in the morning before breakfast. Write it down and keep it in a log. 2) Take your medicines as prescribed 3) Eat low salt foods--Limit salt (sodium) to 2000 mg per day.  4) Stay as active as you can everyday 5) Limit all fluids for the day to less than 2 liters   At the [facility], you and your health needs are our priority. As part of our continuing mission to provide you with exceptional heart care, we have created designated Provider Care Teams. These Care Teams include your primary Cardiologist (physician) and Advanced Practice Providers (APPs- Physician Assistants and Nurse Practitioners) who all work together to provide you with the care you need, when you need it.   You may see any of the following providers on your designated Care Team at your next follow up: Marland Kitchen Dr  Glori Bickers . Dr Loralie Champagne . Darrick Grinder, NP . Lyda Jester, PA . Audry Riles, PharmD   Please be sure to bring in all your medications bottles to every appointment.

## 2020-05-24 NOTE — Progress Notes (Signed)
PCP: Patient, No Pcp Per Primary Cardiologist: Dr Aundra Dubin   HPI: [name] is a 70 y.o. with history of NICM, smoking, cirrhosis, and systolic heart failure.   Admitted 10/13/2017 with increase SOB/CP. HF team consulted. RHC/LHC as noted below with normal cors and adequate cardiac output. SBP was soft so no BB was added. Placed on low dose losartn and spiro. Discharge weight 260 pounds.   Echo in 11/19 showed EF up to 35%, diffuse hypokinesis.  Echo in 11/20 showed EF up to 45-50% with normal RV.   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   Echo today showed EF 55%, normal RV size and systolic function, 4.5 cm aortic root.   Today he returns for followup of CHF.  His main complaint is low back pain/hip pain after an accident where he fell through the floor at his house.  No exertional dyspnea.  No chest pain.  No orthopnea/PND. Uses CPAP regularly. Weight is down 4 lbs.   Labs (7/19): BNP 43, K 4.1, creatinine 0.91 Labs (11/19): K 4.2, creatinine 0.82 Labs (2/20): K 4.1, creatinine 1.11 Labs (1/21): K 4, creatinine 0.86 Labs (9/21): K 4.5, creatinine 0.72  PMH: 1. Chronic systolic CHF: Nonischemic cardiomyopathy.  - LHC/RHC (7/19): No significant coronary disease.  Mean RA 13, PA 44/19, mean PCWP 31, CI 3.48.  - Cardiac MRI (7/19): EF 18%, mild-moderately decreased RV systolic function, ascending aorta 4.4 cm.  - Echo (11/19): EF 35%, diffuse hypokinesis, mild LVH, aortic root 4.3 cm, normal RV size and systolic function.  - Echo (11/20): EF 45-50%, mild LV dilation, normal RV size and systolic function.  - Echo (2/22): EF 55%, normal RV, 4.5 cm aortic root.  2. Dilated aortic root/ascending aorta: 4.4 cm on cardiac MRI 7/19.  - MRA chest (11/20): 4.4 cm aortic root, 4.1 cm ascending aorta.  - Echo (2/22): 4.5 cm aortic root.  3. OSA: Severe on 8/19 sleep study. Uses CPAP.  4. Cirrhosis: Viral hepatitis labs negative, no history of  heavy ETOH.  5. Nephrolithiasis.  6. 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   ROS: All systems negative except as listed in HPI, PMH and Problem List.  Social History   Socioeconomic History  . Marital status: Divorced    Spouse name: Not on file  . Number of children: Not on file  . Years of education: Not on file  . Highest education level: Not on file  Occupational History  . Not on file  Tobacco Use  . Smoking status: Former Smoker    Packs/day: 1.00    Years: 50.00    Pack years: 50.00    Start date: 1968    Quit date: 04/04/2016    Years since quitting: 4.1  . Smokeless tobacco: Never Used  Vaping Use  . Vaping Use: Former  Substance and Sexual Activity  . Alcohol use: Yes    Comment: social  . Drug use: Never    Comment: remotely in the 1970's  . Sexual activity: Not on file  Other Topics Concern  . Not on file  Social History Narrative  . Not on file   Social Determinants of Health   Financial Resource Strain: Not on file  Food Insecurity: Not on file  Transportation Needs: Not on file  Physical Activity: Not on file  Stress: Not on file  Social Connections: Not on file  Intimate Partner Violence: Not on file  Family History  Problem Relation Age of Onset  . Dementia Mother   . Heart disease Father   . Lung cancer Brother        SCLC    Past Medical History:  Diagnosis Date  . CHF (congestive heart failure) ([place])   . Former smoker   . History of kidney stones   . OSA (obstructive sleep apnea) 06/04/2018   Severe obstructive sleep apnea with an AHI of 93/h with oxygen desaturations as low as 81%. Now on CPAP at 11 cm H2O.   . Sucking chest wound    1970's [place]    Current Outpatient Medications  Medication Sig Dispense Refill  . acetaminophen (TYLENOL) 500 MG tablet Take 500 mg by mouth every 6 (six) hours as needed for moderate pain or headache.     Marland Kitchen aspirin 81 MG chewable tablet Chew 81 mg by mouth 2 (two) times  daily.    . carvedilol (COREG) 6.25 MG tablet Take 1 tablet (6.25 mg total) by mouth 2 (two) times daily. Needs appointment for further refill. 120 tablet 0  . dapagliflozin propanediol (FARXIGA) 10 MG TABS tablet Take 1 tablet (10 mg total) by mouth daily before breakfast. 30 tablet 3  . ENTRESTO 24-26 MG TAKE 1 TABLET BY MOUTH TWICE A DAY 180 tablet 3  . famotidine (PEPCID) 20 MG tablet Take 20 mg by mouth 2 (two) times daily.    Marland Kitchen oxymetazoline (AFRIN) 0.05 % nasal spray Place 1 spray into both nostrils 2 (two) times daily as needed for congestion.    Marland Kitchen spironolactone (ALDACTONE) 25 MG tablet Take 1 tablet (25 mg total) by mouth daily. Needs appointment for further refill. 60 tablet 0  . furosemide (LASIX) 40 MG tablet Take 1 tablet (40 mg total) by mouth every morning. 90 tablet 3   No current facility-administered medications for this encounter.    Vitals:   05/22/20 1403  BP: 128/88  Pulse: 68  SpO2: 98%  Weight: 125.9 kg (277 lb 9.6 oz)   Wt Readings from Last 3 Encounters:  05/22/20 125.9 kg (277 lb 9.6 oz)  12/06/19 127.5 kg (281 lb)  04/12/19 125.6 kg (277 lb)     PHYSICAL EXAM: General: NAD Neck: No JVD, no thyromegaly or thyroid nodule.  Lungs: Clear to auscultation bilaterally with normal respiratory effort. CV: Nondisplaced PMI.  Heart regular S1/S2, no S3/S4, no murmur.  No peripheral edema.  No carotid bruit.  Normal pedal pulses.  Abdomen: Soft, nontender, no hepatosplenomegaly, no distention.  Skin: Intact without lesions or rashes.  Neurologic: Alert and oriented x 3.  Psych: Normal affect. Extremities: No clubbing or cyanosis.  HEENT: Normal.   ASSESSMENT & PLAN: 1. Chronic systolic CHF: Echo 9/37 with EF 15-20%. Nonischemic cardiomyopathy, no significant CAD on coronary angiography in 7/19. No strong family history of cardiomyopathy.  No ETOH or drug abuse. Cardiac MRI with EF 18%, no LGE (7/19). RHC (7/19) with preserved cardiac output and elevated  filling pressures. Possible viral myocarditis.  Echo in 11/19 showed EF up to 35%.  Echo in 11/20 showed EF 45-50%.  Echo today showed EF up to 55%.  NYHA class I-II symptoms.  He is not volume overloaded on exam.  - Continue dapagliflozin 10 mg daily.  - Decrease Lasix to 40 mg daily, BMET today.    - Continue spironolactone 25 mg daily.  - Continue Entresto 24/26 bid  - Continue Coreg 6.25 mg bid  - EF is now out of ICD range.  2. Cirrhosis: Noted on CTA. Viral hepatitis labs negative.  No history of heavy ETOH. Liver MRI showed cirrhosis and liver cysts.   3. OSA: Severe.  Using CPAP.   4. Dilated aortic root: MRA 11/20 showed 4.4 cm aortic root.  - I will arrange for repeat MRA chest.   Followup 6 months with APP, BMET in 3 months.   Loralie Champagne  05/24/2020

## 2020-06-05 ENCOUNTER — Ambulatory Visit (INDEPENDENT_AMBULATORY_CARE_PROVIDER_SITE_OTHER): Payer: Medicare Other | Admitting: Otolaryngology

## 2020-06-06 ENCOUNTER — Ambulatory Visit (HOSPITAL_COMMUNITY)
Admission: RE | Admit: 2020-06-06 | Discharge: 2020-06-06 | Disposition: A | Discharge status: Home / Self Care | Payer: Medicare Other | Source: Ambulatory Visit | Attending: Cardiology | Admitting: Cardiology

## 2020-06-06 DIAGNOSIS — I7121 Aneurysm of the ascending aorta, without rupture: Secondary | ICD-10-CM

## 2020-06-06 DIAGNOSIS — I712 Thoracic aortic aneurysm, without rupture: Secondary | ICD-10-CM | POA: Insufficient documentation

## 2020-06-06 MED ORDER — GADOBUTROL 1 MMOL/ML IV SOLN
10.0000 mL | Freq: Once | INTRAVENOUS | Status: AC | PRN
  Administered 2020-06-06: 10 mL via INTRAVENOUS

## 2020-07-03 ENCOUNTER — Other Ambulatory Visit: Payer: Self-pay

## 2020-07-03 ENCOUNTER — Ambulatory Visit (INDEPENDENT_AMBULATORY_CARE_PROVIDER_SITE_OTHER): Payer: Medicare Other | Admitting: Otolaryngology

## 2020-07-03 VITALS — Temp 97.3°F

## 2020-07-03 DIAGNOSIS — Z8521 Personal history of malignant neoplasm of larynx: Secondary | ICD-10-CM

## 2020-07-03 NOTE — Progress Notes (Signed)
HPI: David Castaneda is a 70 y.o. male who returns today for evaluation of T1 left vocal cord cancer.  Patient initially presented here with hoarseness and on exam in the office had a polypoid exophytic left anterior vocal cord lesion that was excised in the OR.  However on the path report it revealed squamous cell carcinoma and patient was taken back a month later for reexcision of this area.  On reexcision of the left anterior true vocal cord there was no residual carcinoma but some mild dysplasia.  He has been doing well since that time with no significant hoarseness.  Past Medical History:  Diagnosis Date  . CHF (congestive heart failure) ([place])   . Former smoker   . History of kidney stones   . OSA (obstructive sleep apnea) 06/04/2018   Severe obstructive sleep apnea with an AHI of 93/h with oxygen desaturations as low as 81%. Now on CPAP at 11 cm H2O.   . Sucking chest wound    1970's [place]   Past Surgical History:  Procedure Laterality Date  . CARDIAC CATHETERIZATION     patient states they went in and looked around but never placed a stent  . DENTAL SURGERY    . MICROLARYNGOSCOPY WITH CO2 LASER AND EXCISION OF VOCAL CORD LESION N/A 03/15/2019   Procedure: MICROLARYNGOSCOPY WITH POSSIBLE CO2 LASER AND EXCISION OF VOCAL CORD LESION;  Surgeon: Rozetta Nunnery, MD;  Location: [facility];  Service: ENT;  Laterality: N/A;  . MICROLARYNGOSCOPY WITH CO2 LASER AND EXCISION OF VOCAL CORD LESION N/A 04/12/2019   Procedure: Microlaryngoscopy With Co2 Laser And Excision Of Vocal Cord;  Surgeon: Rozetta Nunnery, MD;  Location: [facility];  Service: ENT;  Laterality: N/A;  . wound     wound in back in [place]   Social History   Socioeconomic History  . Marital status: Divorced    Spouse name: Not on file  . Number of children: Not on file  . Years of education: Not on file  . Highest education level: Not on file  Occupational History  . Not on file  Tobacco Use  . Smoking status: Former Smoker     Packs/day: 1.00    Years: 50.00    Pack years: 50.00    Start date: 1968    Quit date: 04/04/2016    Years since quitting: 4.2  . Smokeless tobacco: Never Used  Vaping Use  . Vaping Use: Former  Substance and Sexual Activity  . Alcohol use: Yes    Comment: social  . Drug use: Never    Comment: remotely in the 1970's  . Sexual activity: Not on file  Other Topics Concern  . Not on file  Social History Narrative  . Not on file   Social Determinants of Health   Financial Resource Strain: Not on file  Food Insecurity: Not on file  Transportation Needs: Not on file  Physical Activity: Not on file  Stress: Not on file  Social Connections: Not on file   Family History  Problem Relation Age of Onset  . Dementia Mother   . Heart disease Father   . Lung cancer Brother        SCLC   No Known Allergies Prior to Admission medications   Medication Sig Start Date End Date Taking? Authorizing Provider  acetaminophen (TYLENOL) 500 MG tablet Take 500 mg by mouth every 6 (six) hours as needed for moderate pain or headache.     [provider]  aspirin  81 MG chewable tablet Chew 81 mg by mouth 2 (two) times daily.    [provider]  carvedilol (COREG) 6.25 MG tablet Take 1 tablet (6.25 mg total) by mouth 2 (two) times daily. Needs appointment for further refill. 05/22/20   Larey Dresser, MD  dapagliflozin propanediol (FARXIGA) 10 MG TABS tablet Take 1 tablet (10 mg total) by mouth daily before breakfast. 12/06/19   Larey Dresser, MD  ENTRESTO 24-26 MG TAKE 1 TABLET BY MOUTH TWICE A DAY 07/08/19   Larey Dresser, MD  famotidine (PEPCID) 20 MG tablet Take 20 mg by mouth 2 (two) times daily.    [provider]  furosemide (LASIX) 40 MG tablet Take 1 tablet (40 mg total) by mouth every morning. 05/22/20   Larey Dresser, MD  oxymetazoline (AFRIN) 0.05 % nasal spray Place 1 spray into both nostrils 2 (two) times daily as needed for congestion.    [provider]  spironolactone (ALDACTONE) 25 MG tablet Take 1 tablet (25 mg total) by mouth daily. Needs appointment for further refill. 05/22/20   Larey Dresser, MD     Positive ROS: Otherwise negative  All other systems have been reviewed and were otherwise negative with the exception of those mentioned in the HPI and as above.  Physical Exam: Constitutional: Alert, well-appearing, no acute distress Ears: External ears without lesions or tenderness. Ear canals are clear bilaterally with intact, clear TMs.  Nasal: External nose without lesions. Septum is deviated to the right. Clear nasal passages Oral: Lips and gums without lesions. Tongue and palate mucosa without lesions. Posterior oropharynx clear.  Tonsil regions are benign and symmetric bilaterally.. Fiberoptic laryngoscopy was performed to the left nostril.  The nasopharynx was clear.  The base of tongue vallecula epiglottis were normal.  On evaluation of vocal cords vocal cords were clear bilaterally with normal vocal mobility no evidence of recurrent lesions.  This was demonstrated to to his wife in the office. Neck: No palpable adenopathy or masses Respiratory: Breathing comfortably  Skin: No facial/neck lesions or rash noted.  Laryngoscopy  Date/Time: 07/03/2020 2:55 PM Performed by: Rozetta Nunnery, MD Authorized by: Rozetta Nunnery, MD   Consent:    Consent obtained:  Verbal   Consent given by:  Patient Procedure details:    Indications: oncologic surveillance follow-up     Medication:  Afrin   Instrument: flexible fiberoptic laryngoscope     Scope location: left nare   Sinus:    Left nasopharynx: normal   Mouth:    Oropharynx: normal     Vallecula: normal     Base of tongue: normal     Epiglottis: normal   Throat:    True vocal cords: normal   Comments:     On fiberoptic endoscopy vocal cords are clear bilaterally.  No evidence of any residual disease.    Assessment: History of a T1 left  true vocal cord cancer status post surgical excision with CO2 laser performed in January 2021. No evidence of any persistent disease on exam today.  Plan: He will follow-up in 6 months for recheck.  He will return earlier if he notices any hoarseness or problems with his voice.   Radene Journey, MD

## 2020-07-17 ENCOUNTER — Other Ambulatory Visit (HOSPITAL_COMMUNITY): Payer: Self-pay | Admitting: Cardiology

## 2020-07-23 ENCOUNTER — Other Ambulatory Visit (HOSPITAL_COMMUNITY): Payer: Self-pay | Admitting: Cardiology

## 2020-08-14 DIAGNOSIS — Z23 Encounter for immunization: Secondary | ICD-10-CM | POA: Diagnosis not present

## 2020-08-21 ENCOUNTER — Ambulatory Visit (HOSPITAL_COMMUNITY)
Admission: RE | Admit: 2020-08-21 | Discharge: 2020-08-21 | Disposition: A | Discharge status: Home / Self Care | Payer: Medicare Other | Source: Ambulatory Visit | Attending: Cardiology | Admitting: Cardiology

## 2020-08-21 ENCOUNTER — Other Ambulatory Visit: Payer: Self-pay

## 2020-08-21 DIAGNOSIS — I5042 Chronic combined systolic (congestive) and diastolic (congestive) heart failure: Secondary | ICD-10-CM | POA: Diagnosis not present

## 2020-08-21 LAB — BASIC METABOLIC PANEL
Anion gap: 5 (ref 5–15)
BUN: 10 mg/dL (ref 8–23)
CO2: 23 mmol/L (ref 22–32)
Calcium: 8.8 mg/dL — ABNORMAL LOW (ref 8.9–10.3)
Chloride: 108 mmol/L (ref 98–111)
Creatinine, Ser: 0.71 mg/dL (ref 0.61–1.24)
GFR, Estimated: >60 mL/min (ref >60)
Glucose, Bld: 105 mg/dL — ABNORMAL HIGH (ref 70–99)
Potassium: 4.2 mmol/L (ref 3.5–5.1)
Sodium: 136 mmol/L (ref 135–145)

## 2020-11-05 NOTE — Progress Notes (Signed)
PCP: Patient, No Pcp Per (Inactive) HF Cardiologist: Dr Aundra Dubin   HPI: David Castaneda is a 70 y.o. with history of NICM, smoking, cirrhosis, and systolic heart failure.   Admitted 10/13/2017 with increase SOB/CP. HF team consulted. RHC/LHC as noted below with normal cors and adequate cardiac output. SBP was soft so no BB was added. Placed on low dose losartn and spiro. Discharge weight 260 pounds.   Echo in 11/19 showed EF up to 35%, diffuse hypokinesis.  Echo in 11/20 showed EF up to 45-50% with normal RV.   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   Echo 2/22 showed EF 55%, normal RV size and systolic function, 4.5 cm aortic root.   Today he returns for HF follow up with his wife. Still struggling with back and hip pain from a fall earlier this year. Is not very active but some SOB with physical exertion. Leg swelling is ongoing. He admits to drinking >2L/day of fluids. Denies CP, dizziness, or PND/Orthopnea. Appetite ok. No fever or chills. Weight at home 270-280 pounds. Taking all medications. Uses CPAP regularly. He has not taken any extra lasix for weight gain.  ECG (personally reviewed): SR 1st degree AVB w/ PAC, PR 216 ms  Labs (7/19): BNP 43, K 4.1, creatinine 0.91 Labs (11/19): K 4.2, creatinine 0.82 Labs (2/20): K 4.1, creatinine 1.11 Labs (1/21): K 4, creatinine 0.86 Labs (9/21): K 4.5, creatinine 0.72 Labs (5/22): K 4.2, creatinine 0.71  PMH: 1. Chronic systolic CHF: Nonischemic cardiomyopathy.  - LHC/RHC (7/19): No significant coronary disease.  Mean RA 13, PA 44/19, mean PCWP 31, CI 3.48.  - Cardiac MRI (7/19): EF 18%, mild-moderately decreased RV systolic function, ascending aorta 4.4 cm.  - Echo (11/19): EF 35%, diffuse hypokinesis, mild LVH, aortic root 4.3 cm, normal RV size and systolic function.  - Echo (11/20): EF 45-50%, mild LV dilation, normal RV size and systolic function.  - Echo (2/22): EF 55%, normal RV, 4.5  cm aortic root.  2. Dilated aortic root/ascending aorta: 4.4 cm on cardiac MRI 7/19.  - MRA chest (11/20): 4.4 cm aortic root, 4.1 cm ascending aorta.  - Echo (2/22): 4.5 cm aortic root.  3. OSA: Severe on 8/19 sleep study. Uses CPAP.  4. Cirrhosis: Viral hepatitis labs negative, no history of heavy ETOH.  5. Nephrolithiasis.  6. 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   ROS: All systems negative except as listed in HPI, PMH and Problem List.  Social History   Socioeconomic History   Marital status: Divorced    Spouse name: Not on file   Number of children: Not on file   Years of education: Not on file   Highest education level: Not on file  Occupational History   Not on file  Tobacco Use   Smoking status: Former    Packs/day: 1.00    Years: 50.00    Pack years: 50.00    Types: Cigarettes    Start date: 27    Quit date: 04/04/2016    Years since quitting: 4.5   Smokeless tobacco: Never  Vaping Use   Vaping Use: Former  Substance and Sexual Activity   Alcohol use: Yes    Comment: social   Drug use: Never    Comment: remotely in the 1970's   Sexual activity: Not on file  Other Topics Concern   Not on file  Social History Narrative   Not on file  Social Determinants of Health   Financial Resource Strain: Not on file  Food Insecurity: Not on file  Transportation Needs: Not on file  Physical Activity: Not on file  Stress: Not on file  Social Connections: Not on file  Intimate Partner Violence: Not on file   Family History  Problem Relation Age of Onset   Dementia Mother    Heart disease Father    Lung cancer Brother        SCLC   Past Medical History:  Diagnosis Date   CHF (congestive heart failure) ([place])    Former smoker    History of kidney stones    OSA (obstructive sleep apnea) 06/04/2018   Severe obstructive sleep apnea with an AHI of 93/h with oxygen desaturations as low as 81%. Now on CPAP at 11 cm H2O.    Sucking chest  wound    1970's [place]   Current Outpatient Medications  Medication Sig Dispense Refill   acetaminophen (TYLENOL) 500 MG tablet Take 500 mg by mouth every 6 (six) hours as needed for moderate pain or headache.      aspirin 81 MG chewable tablet Chew 81 mg by mouth 2 (two) times daily.     carvedilol (COREG) 6.25 MG tablet Take 1 tablet (6.25 mg total) by mouth 2 (two) times daily. 180 tablet 3   dapagliflozin propanediol (FARXIGA) 10 MG TABS tablet Take 1 tablet (10 mg total) by mouth daily before breakfast. 30 tablet 3   ENTRESTO 24-26 MG TAKE 1 TABLET BY MOUTH TWICE A DAY 180 tablet 0   famotidine (PEPCID) 20 MG tablet Take 20 mg by mouth 2 (two) times daily.     furosemide (LASIX) 40 MG tablet Take 1 tablet (40 mg total) by mouth every morning. 90 tablet 3   oxymetazoline (AFRIN) 0.05 % nasal spray Place 1 spray into both nostrils 2 (two) times daily as needed for congestion.     spironolactone (ALDACTONE) 25 MG tablet Take 1 tablet (25 mg total) by mouth daily. 90 tablet 3   No current facility-administered medications for this encounter.   BP 120/79   Pulse 66   Wt 126.1 kg (277 lb 14.4 oz)   SpO2 95%   BMI 42.25 kg/m   Wt Readings from Last 3 Encounters:  11/06/20 126.1 kg (277 lb 14.4 oz)  05/22/20 125.9 kg (277 lb 9.6 oz)  12/06/19 127.5 kg (281 lb)   PHYSICAL EXAM: General:  NAD. No resp difficulty HEENT: Normal Neck: Supple. JVP 6-7. Carotids 2+ bilat; no bruits. No lymphadenopathy or thryomegaly appreciated. Cor: PMI nondisplaced. Regular rate & rhythm. No rubs, gallops or murmurs. Lungs: Clear Abdomen: Obese, nontender, nondistended. No hepatosplenomegaly. No bruits or masses. Good bowel sounds. Extremities: No cyanosis, clubbing, rash, 1+ LE edema Neuro: Alert & oriented x 3, cranial nerves grossly intact. Moves all 4 extremities w/o difficulty. Affect pleasant.  ASSESSMENT & PLAN: 1. Chronic systolic CHF: Echo [id number] with EF 15-20%.  Nonischemic cardiomyopathy, no  significant CAD on coronary angiography in 7/19.  No strong family history of cardiomyopathy.  No ETOH or drug abuse.  Cardiac MRI with EF 18%, no LGE (7/19).  RHC (7/19) with preserved cardiac output and elevated filling pressures. Possible viral myocarditis.  Echo in 11/19 showed EF up to 35%.  Echo in 11/20 showed EF 45-50%.  Echo 2/22 showed EF up to 55%.  NYHA class II symptoms, although he is not very physically active.  He appears mildly overloaded on exam. - Increase  lasix to 40 mg bid x 3 days, then back to 40 mg daily. BMET today, repeat in 10 days. - Continue dapagliflozin 10 mg daily.   - Continue spironolactone 25 mg daily.  - Continue Entresto 24/26 mg bid  - Continue Coreg 6.25 mg bid  - EF is now out of ICD range.  2. Cirrhosis: Noted on CTA. Viral hepatitis labs negative.  No history of heavy ETOH. Liver MRI showed cirrhosis and liver cysts.   3. OSA: Severe. Using CPAP.  Needs new equipment, will refer back to Dr. Radford Pax. 4. Dilated aortic root: MRA 11/20 showed 4.4 cm aortic root.  - Repeat MRA chest (3/22) stable 4 cm.  Followup in 4 months with Dr. Wynema Birch [facility] FNP 11/06/2020

## 2020-11-06 ENCOUNTER — Ambulatory Visit (HOSPITAL_COMMUNITY)
Admission: RE | Admit: 2020-11-06 | Discharge: 2020-11-06 | Disposition: A | Discharge status: Home / Self Care | Payer: Medicare Other | Source: Ambulatory Visit | Attending: Family Medicine | Admitting: Family Medicine

## 2020-11-06 ENCOUNTER — Other Ambulatory Visit: Payer: Self-pay

## 2020-11-06 ENCOUNTER — Encounter (HOSPITAL_COMMUNITY): Payer: Self-pay

## 2020-11-06 VITALS — BP 120/79 | HR 66 | Wt 277.9 lb

## 2020-11-06 DIAGNOSIS — Z8521 Personal history of malignant neoplasm of larynx: Secondary | ICD-10-CM | POA: Insufficient documentation

## 2020-11-06 DIAGNOSIS — Z9989 Dependence on other enabling machines and devices: Secondary | ICD-10-CM

## 2020-11-06 DIAGNOSIS — I5022 Chronic systolic (congestive) heart failure: Secondary | ICD-10-CM | POA: Diagnosis not present

## 2020-11-06 DIAGNOSIS — M7989 Other specified soft tissue disorders: Secondary | ICD-10-CM | POA: Insufficient documentation

## 2020-11-06 DIAGNOSIS — Z8249 Family history of ischemic heart disease and other diseases of the circulatory system: Secondary | ICD-10-CM | POA: Insufficient documentation

## 2020-11-06 DIAGNOSIS — K7469 Other cirrhosis of liver: Secondary | ICD-10-CM

## 2020-11-06 DIAGNOSIS — G4733 Obstructive sleep apnea (adult) (pediatric): Secondary | ICD-10-CM | POA: Diagnosis not present

## 2020-11-06 DIAGNOSIS — I5042 Chronic combined systolic (congestive) and diastolic (congestive) heart failure: Secondary | ICD-10-CM

## 2020-11-06 DIAGNOSIS — M549 Dorsalgia, unspecified: Secondary | ICD-10-CM | POA: Insufficient documentation

## 2020-11-06 DIAGNOSIS — K7460 Unspecified cirrhosis of liver: Secondary | ICD-10-CM | POA: Insufficient documentation

## 2020-11-06 DIAGNOSIS — Z09 Encounter for follow-up examination after completed treatment for conditions other than malignant neoplasm: Secondary | ICD-10-CM | POA: Diagnosis not present

## 2020-11-06 DIAGNOSIS — Z79899 Other long term (current) drug therapy: Secondary | ICD-10-CM | POA: Insufficient documentation

## 2020-11-06 DIAGNOSIS — I428 Other cardiomyopathies: Secondary | ICD-10-CM | POA: Insufficient documentation

## 2020-11-06 DIAGNOSIS — Z7984 Long term (current) use of oral hypoglycemic drugs: Secondary | ICD-10-CM | POA: Diagnosis not present

## 2020-11-06 DIAGNOSIS — I7121 Aneurysm of the ascending aorta, without rupture: Secondary | ICD-10-CM

## 2020-11-06 DIAGNOSIS — M25559 Pain in unspecified hip: Secondary | ICD-10-CM | POA: Diagnosis not present

## 2020-11-06 DIAGNOSIS — R0602 Shortness of breath: Secondary | ICD-10-CM | POA: Diagnosis not present

## 2020-11-06 DIAGNOSIS — Z87891 Personal history of nicotine dependence: Secondary | ICD-10-CM | POA: Insufficient documentation

## 2020-11-06 DIAGNOSIS — I712 Thoracic aortic aneurysm, without rupture: Secondary | ICD-10-CM | POA: Diagnosis not present

## 2020-11-06 DIAGNOSIS — Z7982 Long term (current) use of aspirin: Secondary | ICD-10-CM | POA: Insufficient documentation

## 2020-11-06 LAB — BASIC METABOLIC PANEL
Anion gap: 9 (ref 5–15)
BUN: 12 mg/dL (ref 8–23)
CO2: 23 mmol/L (ref 22–32)
Calcium: 9 mg/dL (ref 8.9–10.3)
Chloride: 104 mmol/L (ref 98–111)
Creatinine, Ser: 0.76 mg/dL (ref 0.61–1.24)
GFR, Estimated: >60 mL/min (ref >60)
Glucose, Bld: 90 mg/dL (ref 70–99)
Potassium: 4 mmol/L (ref 3.5–5.1)
Sodium: 136 mmol/L (ref 135–145)

## 2020-11-06 NOTE — Patient Instructions (Signed)
Increase Furosemide to 40 mg Twice daily FOR 3 DAYS ONLY, then back to 40 mg Daily  Labs done today, we will call you for abnormal results  Your physician recommends that you return for lab work in: 10 days, we have given you a prescription to have this done locally  Please call Dr Theodosia Blender office at [phone] for your sleep apnea equipment  Please call our office in December to schedule your follow up appointment  If you have any questions or concerns before your next appointment please send [place] a message through [place] or call our office at [phone].    TO LEAVE A MESSAGE FOR THE NURSE SELECT OPTION 2, PLEASE LEAVE A MESSAGE INCLUDING: YOUR NAME DATE OF BIRTH CALL BACK NUMBER REASON FOR CALL**this is important as we prioritize the call backs  YOU WILL RECEIVE A CALL BACK THE SAME DAY AS LONG AS YOU CALL BEFORE 4:00 PM  milAt the [facility], you and your health needs are our priority. As part of our continuing mission to provide you with exceptional heart care, we have created designated Provider Care Teams. These Care Teams include your primary Cardiologist (physician) and Advanced Practice Providers (APPs- Physician Assistants and Nurse Practitioners) who all work together to provide you with the care you need, when you need it.   You may see any of the following providers on your designated Care Team at your next follow up: Dr Glori Bickers Dr Loralie Champagne Dr Patrice Paradise, NP Lyda Jester, [place] Ginnie Smart Audry Riles, PharmD   Please be sure to bring in all your medications bottles to every appointment.

## 2020-11-20 ENCOUNTER — Other Ambulatory Visit: Payer: Self-pay

## 2020-11-20 ENCOUNTER — Ambulatory Visit (HOSPITAL_COMMUNITY)
Admission: RE | Admit: 2020-11-20 | Discharge: 2020-11-20 | Disposition: A | Discharge status: Home / Self Care | Payer: Medicare Other | Source: Ambulatory Visit | Attending: Internal Medicine | Admitting: Internal Medicine

## 2020-11-20 DIAGNOSIS — I5042 Chronic combined systolic (congestive) and diastolic (congestive) heart failure: Secondary | ICD-10-CM | POA: Insufficient documentation

## 2020-11-20 LAB — BASIC METABOLIC PANEL
Anion gap: 5 (ref 5–15)
BUN: 9 mg/dL (ref 8–23)
CO2: 24 mmol/L (ref 22–32)
Calcium: 9 mg/dL (ref 8.9–10.3)
Chloride: 101 mmol/L (ref 98–111)
Creatinine, Ser: 0.79 mg/dL (ref 0.61–1.24)
GFR, Estimated: >60 mL/min (ref >60)
Glucose, Bld: 86 mg/dL (ref 70–99)
Potassium: 4.5 mmol/L (ref 3.5–5.1)
Sodium: 130 mmol/L — ABNORMAL LOW (ref 135–145)

## 2021-01-05 ENCOUNTER — Ambulatory Visit (INDEPENDENT_AMBULATORY_CARE_PROVIDER_SITE_OTHER): Payer: Medicare Other | Admitting: Otolaryngology

## 2021-01-05 ENCOUNTER — Other Ambulatory Visit: Payer: Self-pay

## 2021-01-05 DIAGNOSIS — Z8521 Personal history of malignant neoplasm of larynx: Secondary | ICD-10-CM

## 2021-01-05 NOTE — Progress Notes (Signed)
HPI: David Castaneda is a 70 y.o. 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  This was initially treated surgically in December 2020.  He had a second procedure performed in January 2021 because of a small amount of questionable residual disease.  At that time this was resected with a laser.  He has done well since that time with no evidence of recurrence.  His voice has been good he has had no change in his voice over the past 6 months..  Past Medical History:  Diagnosis Date   CHF (congestive heart failure) ([place])    Former smoker    History of kidney stones    OSA (obstructive sleep apnea) 06/04/2018   Severe obstructive sleep apnea with an AHI of 93/h with oxygen desaturations as low as 81%. Now on CPAP at 11 cm H2O.    Sucking chest wound    1970's [place]   Past Surgical History:  Procedure Laterality Date   CARDIAC CATHETERIZATION     patient states they went in and looked around but never placed a stent   DENTAL SURGERY     MICROLARYNGOSCOPY WITH CO2 LASER AND EXCISION OF VOCAL CORD LESION N/A 03/15/2019   Procedure: MICROLARYNGOSCOPY WITH POSSIBLE CO2 LASER AND EXCISION OF VOCAL CORD LESION;  Surgeon: Rozetta Nunnery, MD;  Location: [place];  Service: ENT;  Laterality: N/A;   MICROLARYNGOSCOPY WITH CO2 LASER AND EXCISION OF VOCAL CORD LESION N/A 04/12/2019   Procedure: Microlaryngoscopy With Co2 Laser And Excision Of Vocal Cord;  Surgeon: Rozetta Nunnery, MD;  Location: [place];  Service: ENT;  Laterality: N/A;   wound     wound in back in war   Social History   Socioeconomic History   Marital status: Divorced    Spouse name: Not on file   Number of children: Not on file   Years of education: Not on file   Highest education level: Not on file  Occupational History   Not on file  Tobacco Use   Smoking status: Former    Packs/day: 1.00    Years: 50.00    Pack years: 50.00    Types: Cigarettes    Start  date: 31    Quit date: 04/04/2016    Years since quitting: 4.7   Smokeless tobacco: Never  Vaping Use   Vaping Use: Former  Substance and Sexual Activity   Alcohol use: Yes    Comment: social   Drug use: Never    Comment: remotely in the 1970's   Sexual activity: Not on file  Other Topics Concern   Not on file  Social History Narrative   Not on file   Social Determinants of Health   Financial Resource Strain: Not on file  Food Insecurity: Not on file  Transportation Needs: Not on file  Physical Activity: Not on file  Stress: Not on file  Social Connections: Not on file   Family History  Problem Relation Age of Onset   Dementia Mother    Heart disease Father    Lung cancer Brother        SCLC   No Known Allergies Prior to Admission medications   Medication Sig Start Date End Date Taking? Authorizing Provider  acetaminophen (TYLENOL) 500 MG tablet Take 500 mg by mouth every 6 (six) hours as needed for moderate pain or headache.     [provider]  aspirin 81 MG chewable tablet  Chew 81 mg by mouth 2 (two) times daily.    [provider]  carvedilol (COREG) 6.25 MG tablet Take 1 tablet (6.25 mg total) by mouth 2 (two) times daily. 07/17/20   Larey Dresser, MD  dapagliflozin propanediol (FARXIGA) 10 MG TABS tablet Take 1 tablet (10 mg total) by mouth daily before breakfast. 12/06/19   Larey Dresser, MD  ENTRESTO 24-26 MG TAKE 1 TABLET BY MOUTH TWICE A DAY 07/24/20   Larey Dresser, MD  famotidine (PEPCID) 20 MG tablet Take 20 mg by mouth 2 (two) times daily.    [provider]  furosemide (LASIX) 40 MG tablet Take 1 tablet (40 mg total) by mouth every morning. 05/22/20   Larey Dresser, MD  oxymetazoline (AFRIN) 0.05 % nasal spray Place 1 spray into both nostrils 2 (two) times daily as needed for congestion.    [provider]  spironolactone (ALDACTONE) 25 MG tablet Take 1 tablet (25 mg total) by mouth daily. 07/17/20   Larey Dresser, MD     Positive ROS: Otherwise negative  All other systems have been reviewed and were otherwise negative with the exception of those mentioned in the HPI and as above.  Physical Exam: Constitutional: Alert, well-appearing, no acute distress Ears: External ears without lesions or tenderness. Ear canals are clear bilaterally with intact, clear TMs.  Nasal: External nose without lesions. Septum with minimal deformity.. Clear nasal passages Oral: Lips and gums without lesions. Tongue and palate mucosa without lesions. Posterior oropharynx clear. Fiberoptic laryngoscopy was performed the left nostril.  The nasopharynx was clear.  Base of tongue vallecula epiglottis were normal.  The vocal cords were clear bilaterally with normal vocal mobility and no vocal cord lesions noted. Neck: No palpable adenopathy or masses.  No palpable lymph nodes on either side of the neck. Respiratory: Breathing comfortably  Skin: No facial/neck lesions or rash noted.  Laryngoscopy  Date/Time: 01/05/2021 5:29 PM Performed by: Rozetta Nunnery, MD Authorized by: Rozetta Nunnery, MD   Consent:    Consent obtained:  Verbal   Consent given by:  Patient Procedure details:    Indications: oncologic surveillance follow-up     Medication:  Afrin   Instrument: flexible fiberoptic laryngoscope     Scope location: left nare   Sinus:    Left nasopharynx: normal   Mouth:    Oropharynx: normal     Vallecula: normal     Base of tongue: normal     Epiglottis: normal   Throat:    True vocal cords: normal   Comments:     Vocal cords were clear bilaterally with a clear anterior commissure.  Vocal cords had normal mobility bilaterally.  Assessment: 2 years status post CO2 laser excision of a left anterior true vocal cord squamous cell carcinoma with no evidence of recurrent disease.  Plan: Reviewed with the patient as well as his wife concerning clear exam today and demonstrated the fiberoptic  laryngoscopy to the wife in the office today. Suggested follow-up with ENT if he has any change in his voice or hoarseness. I discussed with them that I will be retiring at the end of the month and they will have to follow-up with another ENT if needed.   Radene Journey, MD

## 2021-01-30 DIAGNOSIS — U071 COVID-19: Secondary | ICD-10-CM | POA: Diagnosis not present

## 2021-01-30 DIAGNOSIS — Z23 Encounter for immunization: Secondary | ICD-10-CM | POA: Diagnosis not present

## 2021-02-15 DIAGNOSIS — Z23 Encounter for immunization: Secondary | ICD-10-CM | POA: Diagnosis not present

## 2021-06-13 ENCOUNTER — Other Ambulatory Visit (HOSPITAL_COMMUNITY): Payer: Self-pay | Admitting: Cardiology

## 2021-06-17 ENCOUNTER — Other Ambulatory Visit: Payer: Self-pay

## 2021-06-17 ENCOUNTER — Encounter (HOSPITAL_COMMUNITY): Payer: Self-pay | Admitting: Cardiology

## 2021-06-17 ENCOUNTER — Telehealth (HOSPITAL_COMMUNITY): Payer: Self-pay | Admitting: Pharmacy Technician

## 2021-06-17 ENCOUNTER — Ambulatory Visit (HOSPITAL_COMMUNITY)
Admission: RE | Admit: 2021-06-17 | Discharge: 2021-06-17 | Disposition: A | Discharge status: Home / Self Care | Payer: Medicare Other | Source: Ambulatory Visit | Attending: Cardiology | Admitting: Cardiology

## 2021-06-17 VITALS — BP 140/80 | HR 69 | Wt 284.4 lb

## 2021-06-17 DIAGNOSIS — I5021 Acute systolic (congestive) heart failure: Secondary | ICD-10-CM | POA: Diagnosis present

## 2021-06-17 DIAGNOSIS — I5042 Chronic combined systolic (congestive) and diastolic (congestive) heart failure: Secondary | ICD-10-CM | POA: Diagnosis not present

## 2021-06-17 LAB — BASIC METABOLIC PANEL
Anion gap: 12 (ref 5–15)
BUN: 11 mg/dL (ref 8–23)
CO2: 24 mmol/L (ref 22–32)
Calcium: 9.2 mg/dL (ref 8.9–10.3)
Chloride: 101 mmol/L (ref 98–111)
Creatinine, Ser: 0.75 mg/dL (ref 0.61–1.24)
GFR, Estimated: >60 mL/min (ref >60)
Glucose, Bld: 92 mg/dL (ref 70–99)
Potassium: 4.1 mmol/L (ref 3.5–5.1)
Sodium: 137 mmol/L (ref 135–145)

## 2021-06-17 LAB — BRAIN NATRIURETIC PEPTIDE: B Natriuretic Peptide: 12.9 pg/mL (ref 0.0–100.0)

## 2021-06-17 MED ORDER — ENTRESTO 49-51 MG PO TABS: 1.0000 | ORAL_TABLET | Freq: Two times a day (BID) | ORAL | 11 refills | Status: DC

## 2021-06-17 MED ORDER — DAPAGLIFLOZIN PROPANEDIOL 10 MG PO TABS: 10.0000 mg | ORAL_TABLET | Freq: Every day | ORAL | 3 refills | Status: DC

## 2021-06-17 MED ORDER — FUROSEMIDE 40 MG PO TABS: ORAL_TABLET | ORAL | 2 refills | Status: DC

## 2021-06-17 NOTE — Progress Notes (Signed)
CSW completed SDOH screen during clinic visit- no concerns expressed at this time ? ?Jorge Ny, LCSW ?Clinical Social Worker ?[facility] ?Desk#: [phone] ?Cell#: [phone] ? ?

## 2021-06-17 NOTE — Patient Instructions (Signed)
Medication Changes: ? ?Increase Entresto to 49/51 Twice daily ? ?Change lasix (Furosemide) to 40 mg in the morning and 20 mg in the evening ? ?Restart Farxgia 10 mg daily.  ? ?Lab Work: ? ?Labs done today, your results will be available in MyChart, we will contact you for abnormal readings. ? ? ?Testing/Procedures: ? ?Repeat blood work at [facility] in 10 days  ? ?Your physician has requested that you have an echocardiogram. Echocardiography is a painless test that uses sound waves to create images of your heart. It provides your doctor with information about the size and shape of your heart and how well your heart?s chambers and valves are working. This procedure takes approximately one hour. There are no restrictions for this procedure. ? ? ?Referrals: ? ?You have been referred to [facility] for [facility]. They will call you to arrange your appointment. ? ? ?Special Instructions // Education: ? ?none ? ?Follow-Up in: 1 month  ? ?At the [facility], you and your health needs are our priority. We have a designated team specialized in the treatment of Heart Failure. This Care Team includes your primary Heart Failure Specialized Cardiologist (physician), Advanced Practice Providers (APPs- Physician Assistants and Nurse Practitioners), and Pharmacist who all work together to provide you with the care you need, when you need it.  ? ?You may see any of the following providers on your designated Care Team at your next follow up: ? ?Dr Glori Bickers ?Dr Loralie Champagne ?Darrick Grinder, NP ?Lyda Jester, PA ?Jessica Milford,NP ?Marlyce Huge, PA ?Audry Riles, PharmD ? ? ?Please be sure to bring in all your medications bottles to every appointment.  ? ?Need to Contact us: ? ?If you have any questions or concerns before your next appointment please send [place] a message through [place] or call our office at [phone].   ? ?TO LEAVE A MESSAGE FOR THE NURSE SELECT OPTION 2, PLEASE LEAVE A MESSAGE  INCLUDING: ?YOUR NAME ?DATE OF BIRTH ?CALL BACK NUMBER ?REASON FOR CALL**this is important as we prioritize the call backs ? ?YOU WILL RECEIVE A CALL BACK THE SAME DAY AS LONG AS YOU CALL BEFORE 4:00 PM ? ? ?

## 2021-06-17 NOTE — Progress Notes (Signed)
Medication Samples have been provided to the patient. ? ?Drug name: Wilder Glade       Strength: 10        Qty: 4  LOT: [id number]  Exp.Date: 01/02/2024 ? ?Dosing instructions: Take Farxiga 10 mg daily ? ?The patient has been instructed regarding the correct time, dose, and frequency of taking this medication, including desired effects and most common side effects.  ? ?Juanita Laster Zhamir Pirro ?4:06 PM ?06/17/2021 ? ?

## 2021-06-17 NOTE — Progress Notes (Signed)
ReDS Vest / Clip - 06/17/21 1600   ? ?  ? ReDS Vest / Clip  ? Station Marker D   ? Ruler Value 36   ? ReDS Value Range High volume overload   ? ReDS Actual Value 42   ? ?  ?  ? ?  ? ? ?

## 2021-06-17 NOTE — Telephone Encounter (Signed)
[facility] Patient Advocate Encounter ? ?Patient was seen in clinic today and stated he has not received Farxiga from [facility]. Looks like he needs a new RX sent. All medicare participants who received Farxiga assistance from [employer] were automatically renewed for assistance through 04/03/22. Virgilio Belling (RN) to send a new 90 day rx. ? ?[name], CPhT ? ?

## 2021-06-19 NOTE — Progress Notes (Signed)
?PCP: Patient, No Pcp Per (Inactive) ?Primary Cardiologist: Dr Aundra Dubin  ? ?HPI: ?[name] is a 71 y.o. with history of NICM, smoking, cirrhosis, and systolic heart failure.  ? ?Admitted 10/13/2017 with increase SOB/CP. HF team consulted. RHC/LHC as noted below with normal cors and adequate cardiac output. SBP was soft so no BB was added. Placed on low dose losartn and spiro. Discharge weight 260 pounds.  ? ?Echo in 11/19 showed EF up to 35%, diffuse hypokinesis.  Echo in 11/20 showed EF up to 45-50% with normal RV.  ? 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  ? ?Echo in 2/22 showed EF 55%, normal RV size and systolic function, 4.5 cm aortic root.  ? ?Today he returns for followup of CHF.  Main complaint is sciatica.  He is short of breath with heavy yardwork.  No dyspnea walking on flat ground or with usual ADLs.  Finding it hard to lose weight with sciatica.  No chest pain.  No lightheadedness.  ? ?REDS clip 42% ? ?Labs (7/19): BNP 43, K 4.1, creatinine 0.91 ?Labs (11/19): K 4.2, creatinine 0.82 ?Labs (2/20): K 4.1, creatinine 1.11 ?Labs (1/21): K 4, creatinine 0.86 ?Labs (9/21): K 4.5, creatinine 0.72 ?Labs (8/22): K 4.5, creatinine 0.79 ? ?PMH: ?1. Chronic systolic CHF: Nonischemic cardiomyopathy.  ?- LHC/RHC (7/19): No significant coronary disease.  Mean RA 13, PA 44/19, mean PCWP 31, CI 3.48.  ?- Cardiac MRI (7/19): EF 18%, mild-moderately decreased RV systolic function, ascending aorta 4.4 cm.  ?- Echo (11/19): EF 35%, diffuse hypokinesis, mild LVH, aortic root 4.3 cm, normal RV size and systolic function.  ?- Echo (11/20): EF 45-50%, mild LV dilation, normal RV size and systolic function.  ?- Echo (2/22): EF 55%, normal RV, 4.5 cm aortic root.  ?2. Dilated aortic root/ascending aorta: 4.4 cm on cardiac MRI 7/19.  ?- MRA chest (11/20): 4.4 cm aortic root, 4.1 cm ascending aorta.  ?- Echo (2/22): 4.5 cm aortic root.  ?- MRA (3/22): 4.5 cm aortic root.  ?3.  OSA: Severe on 8/19 sleep study. Uses CPAP.  ?4. Cirrhosis: Viral hepatitis labs negative, no history of heavy ETOH.  ?5. Nephrolithiasis.  ?6. 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  ? ?ROS: All systems negative except as listed in HPI, PMH and Problem List. ? ?Social History  ? ?Socioeconomic History  ? Marital status: Divorced  ?  Spouse name: Not on file  ? Number of children: Not on file  ? Years of education: Not on file  ? Highest education level: Not on file  ?Occupational History  ? Not on file  ?Tobacco Use  ? Smoking status: Former  ?  Packs/day: 1.00  ?  Years: 50.00  ?  Pack years: 50.00  ?  Types: Cigarettes  ?  Start date: 1968  ?  Quit date: 04/04/2016  ?  Years since quitting: 5.2  ? Smokeless tobacco: Never  ?Vaping Use  ? Vaping Use: Former  ?Substance and Sexual Activity  ? Alcohol use: Yes  ?  Comment: social  ? Drug use: Never  ?  Comment: remotely in the 1970's  ? Sexual activity: Not on file  ?Other Topics Concern  ? Not on file  ?Social History Narrative  ? Not on file  ? ?Social Determinants of Health  ? ?Financial Resource Strain: Low Risk   ? Difficulty of Paying Living Expenses: Not hard at all  ?Food  Insecurity: No Food Insecurity  ? Worried About Charity fundraiser in the Last Year: Never true  ? Ran Out of Food in the Last Year: Never true  ?Transportation Needs: No Transportation Needs  ? Lack of Transportation (Medical): No  ? Lack of Transportation (Non-Medical): No  ?Physical Activity: Not on file  ?Stress: Not on file  ?Social Connections: Not on file  ?Intimate Partner Violence: Not on file  ? ? ?Family History  ?Problem Relation Age of Onset  ? Dementia Mother   ? Heart disease Father   ? Lung cancer Brother   ?     SCLC  ? ? ?Current Outpatient Medications  ?Medication Sig Dispense Refill  ? acetaminophen (TYLENOL) 500 MG tablet Take 500 mg by mouth every 6 (six) hours as needed for moderate pain or headache.     ? aspirin 81 MG chewable tablet Chew 81  mg by mouth 2 (two) times daily.    ? carvedilol (COREG) 6.25 MG tablet Take 1 tablet (6.25 mg total) by mouth 2 (two) times daily. 180 tablet 3  ? famotidine (PEPCID) 20 MG tablet Take 20 mg by mouth 2 (two) times daily.    ? oxymetazoline (AFRIN) 0.05 % nasal spray Place 1 spray into both nostrils 2 (two) times daily as needed for congestion.    ? sacubitril-valsartan (ENTRESTO) 49-51 MG Take 1 tablet by mouth 2 (two) times daily. 60 tablet 11  ? spironolactone (ALDACTONE) 25 MG tablet Take 1 tablet (25 mg total) by mouth daily. 90 tablet 3  ? dapagliflozin propanediol (FARXIGA) 10 MG TABS tablet Take 1 tablet (10 mg total) by mouth daily before breakfast. 90 tablet 3  ? furosemide (LASIX) 40 MG tablet Take 1 tablet (40 mg total) by mouth every morning AND 0.5 tablets (20 mg total) every evening. 180 tablet 2  ? ?No current facility-administered medications for this encounter.  ? ? ?Vitals:  ? 06/17/21 1525  ?BP: 140/80  ?Pulse: 69  ?SpO2: 95%  ?Weight: 129 kg (284 lb 6.4 oz)  ? ?Wt Readings from Last 3 Encounters:  ?06/17/21 129 kg (284 lb 6.4 oz)  ?11/06/20 126.1 kg (277 lb 14.4 oz)  ?05/22/20 125.9 kg (277 lb 9.6 oz)  ? ? ReDS Vest / Clip - 06/17/21 1600   ? ?  ? ReDS Vest / Clip  ? Station Marker D   ? Ruler Value 36   ? ReDS Value Range High volume overload   ? ReDS Actual Value 42   ? ?  ?  ? ?  ? ? ?PHYSICAL EXAM: ?General: NAD ?Neck: JVP 8 cm, no thyromegaly or thyroid nodule.  ?Lungs: Clear to auscultation bilaterally with normal respiratory effort. ?CV: Nondisplaced PMI.  Heart regular S1/S2, no S3/S4, no murmur.  1+ ankle edema.  No carotid bruit.  Normal pedal pulses.  ?Abdomen: Soft, nontender, no hepatosplenomegaly, no distention.  ?Skin: Intact without lesions or rashes.  ?Neurologic: Alert and oriented x 3.  ?Psych: Normal affect. ?Extremities: No clubbing or cyanosis.  ?HEENT: Normal.  ? ?ASSESSMENT & PLAN: ?1. Chronic systolic CHF: Echo 3/81 with EF 15-20%.  Nonischemic cardiomyopathy, no  significant CAD on coronary angiography in 7/19.  No strong family history of cardiomyopathy.  No ETOH or drug abuse.  Cardiac MRI with EF 18%, no LGE (7/19).  RHC (7/19) with preserved cardiac output and elevated filling pressures. Possible viral myocarditis.  Echo in 11/19 showed EF up to 35%.  Echo in 11/20 showed EF 45-50%.  Echo in 2/22 showed EF up to 55%.  Mildly volume overloaded by exam but REDS clip significantly elevated. NYHA class II.  ?- Restart dapagliflozin 10 mg daily.  ?- Increase Lasix to 40 qam/20 qpm.  BMET/BNP today and BMET in 10 days.     ?- Continue spironolactone 25 mg daily.  ?- Increase Entresto to 49/51 bid  ?- Continue Coreg 6.25 mg bid  ?- I will order echo.  ?2. Cirrhosis: Noted on CTA. Viral hepatitis labs negative.  No history of heavy ETOH. Liver MRI showed cirrhosis and liver cysts.   ?3. OSA: Severe.  Using CPAP.   ?4. Dilated aortic root: MRA 3/22 showed 4.5 cm aortic root.  ?- Dilation seems limited to aortic root, can follow by echo. Due for repeat echo, will arrange.   ?5. Obesity: I will refer to pharmacy clinic for semaglutide.  ? ?Followup 1 month with APP ? ?David Castaneda Aundra Dubin  ?06/19/2021 ? ? ? ?

## 2021-06-19 NOTE — Addendum Note (Signed)
Encounter addended by: Larey Dresser, MD on: 06/19/2021 9:09 PM ? Actions taken: Clinical Note Signed, Level of Service modified, Visit diagnoses modified

## 2021-06-21 ENCOUNTER — Telehealth: Payer: Self-pay | Admitting: Pharmacist

## 2021-06-21 NOTE — Telephone Encounter (Signed)
Received referral from Dr. Aundra Dubin to start semaglutide for this patient for weight loss. They meet FDA approved criteria for semaglutide for use in obesity given BMI 43. Obesity is complicated by chronic conditions including NICM, smoking, cirrhosis, and systolic heart failure. .  ? ?PA for Ozempic has been submitted to their insurance. Ozempic available without PA. This is technically an off label use since patient does not have DM. ? ?Patient is already on 2 branded medications. ?Wilder Glade he receives through patient assistance. ? ?He has Rib injuries and sciatica pain that limit his activity. Trying to get in to the [place] for pain management. ? ?I called patient to discuss Ozempic. Discussed cost of $45/month. Once he hits the coverage gap in a few months cost of Ozempic and Delene Loll will go up. Patient concerned about cost. ?I did offer him an appointment to discuss diet and other options to help with weight. Patient wanted to wait another month and get back to me.  ?

## 2021-06-28 ENCOUNTER — Other Ambulatory Visit (HOSPITAL_COMMUNITY)
Admission: RE | Admit: 2021-06-28 | Discharge: 2021-06-28 | Disposition: A | Discharge status: Home / Self Care | Payer: Medicare Other | Source: Ambulatory Visit | Attending: Cardiology | Admitting: Cardiology

## 2021-06-28 DIAGNOSIS — I5021 Acute systolic (congestive) heart failure: Secondary | ICD-10-CM | POA: Diagnosis present

## 2021-06-28 LAB — BASIC METABOLIC PANEL
Anion gap: 6 (ref 5–15)
BUN: 10 mg/dL (ref 8–23)
CO2: 22 mmol/L (ref 22–32)
Calcium: 9.1 mg/dL (ref 8.9–10.3)
Chloride: 109 mmol/L (ref 98–111)
Creatinine, Ser: 0.65 mg/dL (ref 0.61–1.24)
GFR, Estimated: >60 mL/min (ref >60)
Glucose, Bld: 108 mg/dL — ABNORMAL HIGH (ref 70–99)
Potassium: 4.1 mmol/L (ref 3.5–5.1)
Sodium: 137 mmol/L (ref 135–145)

## 2021-07-20 NOTE — Progress Notes (Signed)
?PCP: Patient, No Pcp Per (Inactive) ?Primary Cardiologist: Dr Aundra Dubin  ? ?HPI: ?[name] is a 71 y.o. with history of NICM, smoking, cirrhosis, and systolic heart failure.  ? ?Admitted 10/13/2017 with increase SOB/CP. HF team consulted. RHC/LHC as noted below with normal cors and adequate cardiac output. SBP was soft so no BB was added. Placed on low dose losartn and spiro. Discharge weight 260 pounds.  ? ?Echo in 11/19 showed EF up to 35%, diffuse hypokinesis.  Echo in 11/20 showed EF up to 45-50% with normal RV.  ? 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  ? ?Echo in 2/22 showed EF 55%, normal RV size and systolic function, 4.5 cm aortic root.  ? ?Follow up 3/23 he had stable NYHA II but was mildly volume overloaded, REDs  42%. Lasix increased to 40/20 and Entresto increased to 49/51. Repeat echo arranged. ? ?Echo today 07/21/21, results pending. ? ?Today he returns for HF follow up with his family member. Overall feeling fine. He has dyspnea with going up stairs, does OK walking on flat ground with his cane. Sciatica remains a large physical limiter. Denies palpitations, CP, dizziness, edema, or PND/Orthopnea. Appetite ok. No fever or chills. Weight at home 284 pounds. Taking all medications. Wears CPAP at night. ? ?REDS clip 42% (06/17/21)-->37% today. ? ?Labs (7/19): BNP 43, K 4.1, creatinine 0.91 ?Labs (11/19): K 4.2, creatinine 0.82 ?Labs (2/20): K 4.1, creatinine 1.11 ?Labs (1/21): K 4, creatinine 0.86 ?Labs (9/21): K 4.5, creatinine 0.72 ?Labs (8/22): K 4.5, creatinine 0.79 ?Labs (3/23): K 4.1, creatinine 0.65 ? ?PMH: ?1. Chronic systolic CHF: Nonischemic cardiomyopathy.  ?- LHC/RHC (7/19): No significant coronary disease.  Mean RA 13, PA 44/19, mean PCWP 31, CI 3.48.  ?- Cardiac MRI (7/19): EF 18%, mild-moderately decreased RV systolic function, ascending aorta 4.4 cm.  ?- Echo (11/19): EF 35%, diffuse hypokinesis, mild LVH, aortic root 4.3 cm, normal RV  size and systolic function.  ?- Echo (11/20): EF 45-50%, mild LV dilation, normal RV size and systolic function.  ?- Echo (2/22): EF 55%, normal RV, 4.5 cm aortic root.  ?2. Dilated aortic root/ascending aorta: 4.4 cm on cardiac MRI 7/19.  ?- MRA chest (11/20): 4.4 cm aortic root, 4.1 cm ascending aorta.  ?- Echo (2/22): 4.5 cm aortic root.  ?- MRA (3/22): 4.5 cm aortic root.  ?3. OSA: Severe on 8/19 sleep study. Uses CPAP.  ?4. Cirrhosis: Viral hepatitis labs negative, no history of heavy ETOH.  ?5. Nephrolithiasis.  ?6. 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  ? ?ROS: All systems negative except as listed in HPI, PMH and Problem List. ? ?Social History  ? ?Socioeconomic History  ? Marital status: Divorced  ?  Spouse name: Not on file  ? Number of children: Not on file  ? Years of education: Not on file  ? Highest education level: Not on file  ?Occupational History  ? Not on file  ?Tobacco Use  ? Smoking status: Former  ?  Packs/day: 1.00  ?  Years: 50.00  ?  Pack years: 50.00  ?  Types: Cigarettes  ?  Start date: 1968  ?  Quit date: 04/04/2016  ?  Years since quitting: 5.2  ? Smokeless tobacco: Never  ?Vaping Use  ? Vaping Use: Former  ?Substance and Sexual Activity  ? Alcohol use: Yes  ?  Comment: social  ? Drug use: Never  ?  Comment: remotely  in the 1970's  ? Sexual activity: Not on file  ?Other Topics Concern  ? Not on file  ?Social History Narrative  ? Not on file  ? ?Social Determinants of Health  ? ?Financial Resource Strain: Low Risk   ? Difficulty of Paying Living Expenses: Not hard at all  ?Food Insecurity: No Food Insecurity  ? Worried About Charity fundraiser in the Last Year: Never true  ? Ran Out of Food in the Last Year: Never true  ?Transportation Needs: No Transportation Needs  ? Lack of Transportation (Medical): No  ? Lack of Transportation (Non-Medical): No  ?Physical Activity: Not on file  ?Stress: Not on file  ?Social Connections: Not on file  ?Intimate Partner Violence:  Not on file  ? ? ?Family History  ?Problem Relation Age of Onset  ? Dementia Mother   ? Heart disease Father   ? Lung cancer Brother   ?     SCLC  ? ? ?Current Outpatient Medications  ?Medication Sig Dispense Refill  ? acetaminophen (TYLENOL) 500 MG tablet Take 500 mg by mouth every 6 (six) hours as needed for moderate pain or headache.     ? aspirin 81 MG chewable tablet Chew 81 mg by mouth 2 (two) times daily.    ? carvedilol (COREG) 6.25 MG tablet TAKE 1 TABLET BY MOUTH TWICE A DAY 180 tablet 3  ? dapagliflozin propanediol (FARXIGA) 10 MG TABS tablet Take 1 tablet (10 mg total) by mouth daily before breakfast. 90 tablet 3  ? famotidine (PEPCID) 20 MG tablet Take 20 mg by mouth 2 (two) times daily.    ? furosemide (LASIX) 40 MG tablet Take 1 tablet (40 mg total) by mouth every morning AND 0.5 tablets (20 mg total) every evening. 180 tablet 2  ? oxymetazoline (AFRIN) 0.05 % nasal spray Place 1 spray into both nostrils 2 (two) times daily as needed for congestion.    ? sacubitril-valsartan (ENTRESTO) 49-51 MG Take 1 tablet by mouth 2 (two) times daily. 60 tablet 11  ? spironolactone (ALDACTONE) 25 MG tablet TAKE 1 TABLET (25 MG TOTAL) BY MOUTH DAILY. 90 tablet 3  ? ?No current facility-administered medications for this encounter.  ? ?BP 104/80   Pulse (!) 56   Wt 129.1 kg (284 lb 9.6 oz)   SpO2 97%   BMI 43.27 kg/m?  ? ?Wt Readings from Last 3 Encounters:  ?07/21/21 129.1 kg (284 lb 9.6 oz)  ?06/17/21 129 kg (284 lb 6.4 oz)  ?11/06/20 126.1 kg (277 lb 14.4 oz)  ? ? ?PHYSICAL EXAM: ?General:  NAD. No resp difficulty, walked into clinic with cane. ?HEENT: Normal ?Neck: Supple. thick neck Carotids 2+ bilat; no bruits. No lymphadenopathy or thryomegaly appreciated. ?Cor: PMI nondisplaced. Regular rate & rhythm. No rubs, gallops or murmurs. ?Lungs: Clear ?Abdomen: Obese, soft, nontender, nondistended. No hepatosplenomegaly. No bruits or masses. Good bowel sounds. ?Extremities: No cyanosis, clubbing, rash, trace BLE  edema R>L ?Neuro: Alert & oriented x 3, cranial nerves grossly intact. Moves all 4 extremities w/o difficulty. Affect pleasant. ? ?ASSESSMENT & PLAN: ?1. Chronic systolic CHF: Echo 1/70 with EF 15-20%.  Nonischemic cardiomyopathy, no significant CAD on coronary angiography in 7/19.  No strong family history of cardiomyopathy.  No ETOH or drug abuse.  Cardiac MRI with EF 18%, no LGE (7/19).  RHC (7/19) with preserved cardiac output and elevated filling pressures. Possible viral myocarditis.  Echo in 11/19 showed EF up to 35%.  Echo in 11/20 showed EF 45-50%.  Echo in 2/22 showed EF up to 55%.  Stable NYHA II, limited by sciatica. He is not volume overloaded on exam, REDs 37%. ?- Continue dapagliflozin 10 mg daily.  ?- Continue Lasix 40 qam/20 qpm.  BMET today.  ?- Continue spironolactone 25 mg daily.  ?- Continue Entresto 49/51 bid  ?- Continue Coreg 6.25 mg bid  ?2. Cirrhosis: Noted on CTA. Viral hepatitis labs negative.  No history of heavy ETOH. Liver MRI showed cirrhosis and liver cysts.   ?3. OSA: Severe.  Using CPAP.   ?4. Dilated aortic root: MRA 3/22 showed 4.5 cm aortic root.  ?- Dilation seems limited to aortic root, can follow by echo.  ?5. Obesity: Body mass index is 43.27 kg/m?Marland Kitchen He has been referred to pharmacy clinic for semaglutide. Cost is an issue. Patient will follow back up with pharmacist in a month. ? ?Follow up with Dr. Aundra Dubin in 5-6 months. ? ?Rafael Bihari FNP-BC ?07/21/2021 ? ?

## 2021-07-21 ENCOUNTER — Other Ambulatory Visit (HOSPITAL_COMMUNITY): Payer: Self-pay | Admitting: Cardiology

## 2021-07-21 ENCOUNTER — Encounter (HOSPITAL_COMMUNITY): Payer: Self-pay

## 2021-07-21 ENCOUNTER — Ambulatory Visit (HOSPITAL_BASED_OUTPATIENT_CLINIC_OR_DEPARTMENT_OTHER)
Admission: RE | Admit: 2021-07-21 | Discharge: 2021-07-21 | Disposition: A | Discharge status: Home / Self Care | Payer: Medicare Other | Source: Ambulatory Visit

## 2021-07-21 ENCOUNTER — Ambulatory Visit (HOSPITAL_COMMUNITY)
Admission: RE | Admit: 2021-07-21 | Discharge: 2021-07-21 | Disposition: A | Discharge status: Home / Self Care | Payer: Medicare Other | Source: Ambulatory Visit | Attending: Family Medicine | Admitting: Family Medicine

## 2021-07-21 VITALS — BP 104/80 | HR 56 | Wt 284.6 lb

## 2021-07-21 DIAGNOSIS — I5022 Chronic systolic (congestive) heart failure: Secondary | ICD-10-CM | POA: Diagnosis present

## 2021-07-21 DIAGNOSIS — K7460 Unspecified cirrhosis of liver: Secondary | ICD-10-CM | POA: Insufficient documentation

## 2021-07-21 DIAGNOSIS — I5021 Acute systolic (congestive) heart failure: Secondary | ICD-10-CM | POA: Diagnosis not present

## 2021-07-21 DIAGNOSIS — I7121 Aneurysm of the ascending aorta, without rupture: Secondary | ICD-10-CM

## 2021-07-21 DIAGNOSIS — I77810 Thoracic aortic ectasia: Secondary | ICD-10-CM | POA: Insufficient documentation

## 2021-07-21 DIAGNOSIS — I428 Other cardiomyopathies: Secondary | ICD-10-CM | POA: Diagnosis not present

## 2021-07-21 DIAGNOSIS — M5430 Sciatica, unspecified side: Secondary | ICD-10-CM | POA: Diagnosis not present

## 2021-07-21 DIAGNOSIS — Z9889 Other specified postprocedural states: Secondary | ICD-10-CM | POA: Insufficient documentation

## 2021-07-21 DIAGNOSIS — Z79899 Other long term (current) drug therapy: Secondary | ICD-10-CM | POA: Insufficient documentation

## 2021-07-21 DIAGNOSIS — Z8521 Personal history of malignant neoplasm of larynx: Secondary | ICD-10-CM | POA: Diagnosis not present

## 2021-07-21 DIAGNOSIS — G4733 Obstructive sleep apnea (adult) (pediatric): Secondary | ICD-10-CM | POA: Insufficient documentation

## 2021-07-21 DIAGNOSIS — K7469 Other cirrhosis of liver: Secondary | ICD-10-CM | POA: Diagnosis not present

## 2021-07-21 DIAGNOSIS — I5042 Chronic combined systolic (congestive) and diastolic (congestive) heart failure: Secondary | ICD-10-CM

## 2021-07-21 DIAGNOSIS — Z6841 Body Mass Index (BMI) 40.0 and over, adult: Secondary | ICD-10-CM | POA: Diagnosis not present

## 2021-07-21 DIAGNOSIS — Z87891 Personal history of nicotine dependence: Secondary | ICD-10-CM | POA: Diagnosis not present

## 2021-07-21 DIAGNOSIS — Z7984 Long term (current) use of oral hypoglycemic drugs: Secondary | ICD-10-CM | POA: Insufficient documentation

## 2021-07-21 DIAGNOSIS — Z9989 Dependence on other enabling machines and devices: Secondary | ICD-10-CM

## 2021-07-21 DIAGNOSIS — E669 Obesity, unspecified: Secondary | ICD-10-CM | POA: Diagnosis not present

## 2021-07-21 DIAGNOSIS — Z8249 Family history of ischemic heart disease and other diseases of the circulatory system: Secondary | ICD-10-CM | POA: Insufficient documentation

## 2021-07-21 LAB — BASIC METABOLIC PANEL
Anion gap: 8 (ref 5–15)
BUN: 8 mg/dL (ref 8–23)
CO2: 23 mmol/L (ref 22–32)
Calcium: 8.9 mg/dL (ref 8.9–10.3)
Chloride: 107 mmol/L (ref 98–111)
Creatinine, Ser: 0.73 mg/dL (ref 0.61–1.24)
GFR, Estimated: >60 mL/min (ref >60)
Glucose, Bld: 88 mg/dL (ref 70–99)
Potassium: 4 mmol/L (ref 3.5–5.1)
Sodium: 138 mmol/L (ref 135–145)

## 2021-07-21 LAB — ECHOCARDIOGRAM COMPLETE
Area-P 1/2: 2.8 cm2
Calc EF: 54.7 %
S' Lateral: 3.85 cm
Single Plane A2C EF: 55.5 %
Single Plane A4C EF: 54.5 %

## 2021-07-21 NOTE — Patient Instructions (Signed)
Medication Changes: ? ?None, continue your current medications ? ?**We have provided you samples of Wilder Glade, please call [facility] at [phone] to follow up on your shipment, we sent the new prescription on 06/17/21 ? ?Lab Work: ? ?Done today, we will call you for abnormal results ? ?Testing/Procedures: ? ?None ? ?Referrals: ? ?None ? ?Special Instructions // Education: ? ?Do the following things EVERYDAY: ?Weigh yourself in the morning before breakfast. Write it down and keep it in a log. ?Take your medicines as prescribed ?Eat low salt foods--Limit salt (sodium) to 2000 mg per day.  ?Stay as active as you can everyday ?Limit all fluids for the day to less than 2 liters ? ? ?Follow-Up in: 6 months, **PLEASE CALL OUR OFFICE IN AUGUST TO SCHEDULE THIS APPOINTMENT ? ?At the [facility], you and your health needs are our priority. We have a designated team specialized in the treatment of Heart Failure. This Care Team includes your primary Heart Failure Specialized Cardiologist (physician), Advanced Practice Providers (APPs- Physician Assistants and Nurse Practitioners), and Pharmacist who all work together to provide you with the care you need, when you need it.  ? ?You may see any of the following providers on your designated Care Team at your next follow up: ? ?Dr Glori Bickers ?Dr Loralie Champagne ?Darrick Grinder, NP ?Lyda Jester, PA ?Jessica Milford,NP ?Marlyce Huge, PA ?Audry Riles, PharmD ? ? ?Please be sure to bring in all your medications bottles to every appointment.  ? ?Need to Contact us: ? ?If you have any questions or concerns before your next appointment please send [place] a message through [place] or call our office at [phone].   ? ?TO LEAVE A MESSAGE FOR THE NURSE SELECT OPTION 2, PLEASE LEAVE A MESSAGE INCLUDING: ?YOUR NAME ?DATE OF BIRTH ?CALL BACK NUMBER ?REASON FOR CALL**this is important as we prioritize the call backs ? ?YOU WILL RECEIVE A CALL BACK THE SAME DAY AS LONG AS YOU  CALL BEFORE 4:00 PM ? ? ?

## 2021-07-21 NOTE — Progress Notes (Signed)
ReDS Vest / Clip - 07/21/21 1500   ? ?  ? ReDS Vest / Clip  ? Station Marker D   ? Ruler Value 38   ? ReDS Value Range Moderate volume overload   ? ReDS Actual Value 37   ? ?  ?  ? ?  ? ? ?

## 2021-07-21 NOTE — Progress Notes (Signed)
Medication Samples have been provided to the patient. ? ?Drug name: Wilder Glade       Strength: 10 mg        Qty: 3  LOT: [id number]  Exp.Date: 02/02/24 ? ?Dosing instructions: Take 1 tab Daily ? ?The patient has been instructed regarding the correct time, dose, and frequency of taking this medication, including desired effects and most common side effects.  ? ?Janeya Deyo ?3:16 PM ?07/21/2021 ? ?

## 2021-12-14 ENCOUNTER — Telehealth: Payer: Self-pay | Admitting: Cardiology

## 2021-12-14 NOTE — Telephone Encounter (Signed)
Patient's friend called stating David Castaneda's c-pap machine is not working anymore.  He needs an order for a new one.

## 2021-12-16 NOTE — Telephone Encounter (Signed)
Reached out to the patient and provided him the number to his dme (C-A) to call for trouble shooting as he is not eligible for a new machine until 2024. Pt is agreeable to treatment.

## 2021-12-17 NOTE — Telephone Encounter (Signed)
Patient called back to say David Castaneda says he needs a Rx for cpap supplies. Patient has not seen Dr Radford Pax since 06/2018 and he will need a office visit to get supplies. Patient will call back.

## 2021-12-29 ENCOUNTER — Encounter (HOSPITAL_COMMUNITY): Payer: Self-pay | Admitting: *Deleted

## 2021-12-29 ENCOUNTER — Emergency Department (HOSPITAL_COMMUNITY)
Admission: EM | Admit: 2021-12-29 | Discharge: 2021-12-29 | Disposition: A | Discharge status: Home / Self Care | Payer: Medicare Other | Attending: Emergency Medicine | Admitting: Emergency Medicine

## 2021-12-29 ENCOUNTER — Other Ambulatory Visit: Payer: Self-pay

## 2021-12-29 ENCOUNTER — Emergency Department (HOSPITAL_COMMUNITY): Payer: Medicare Other

## 2021-12-29 DIAGNOSIS — W010XXA Fall on same level from slipping, tripping and stumbling without subsequent striking against object, initial encounter: Secondary | ICD-10-CM | POA: Diagnosis not present

## 2021-12-29 DIAGNOSIS — Z79899 Other long term (current) drug therapy: Secondary | ICD-10-CM | POA: Insufficient documentation

## 2021-12-29 DIAGNOSIS — S20211A Contusion of right front wall of thorax, initial encounter: Secondary | ICD-10-CM | POA: Insufficient documentation

## 2021-12-29 DIAGNOSIS — Z7982 Long term (current) use of aspirin: Secondary | ICD-10-CM | POA: Insufficient documentation

## 2021-12-29 DIAGNOSIS — S299XXA Unspecified injury of thorax, initial encounter: Secondary | ICD-10-CM | POA: Diagnosis present

## 2021-12-29 DIAGNOSIS — W19XXXA Unspecified fall, initial encounter: Secondary | ICD-10-CM

## 2021-12-29 MED ORDER — ETODOLAC 300 MG PO CAPS: 300.0000 mg | ORAL_CAPSULE | Freq: Three times a day (TID) | ORAL | 0 refills | Status: DC

## 2021-12-29 MED ORDER — LIDOCAINE 5 % EX PTCH: 1.0000 | MEDICATED_PATCH | CUTANEOUS | 0 refills | Status: DC

## 2021-12-29 NOTE — ED Notes (Signed)
Patient transported to X-ray 

## 2021-12-29 NOTE — ED Provider Notes (Signed)
[facility] EMERGENCY DEPARTMENT Provider Note   CSN: [id number] Arrival date & time: 12/29/21  1456     History  Chief Complaint  Patient presents with   Lytle Michaels    David Castaneda is a 71 y.o. male.   Fall     Patient has a history of CHF, obstructive sleep apnea, prior traumatic chest injury during the [place] War presents to the ED for evaluation of rib injury.  Patient states he fell on Monday night.  He ended up landing on his rib.  He felt a pop on Tuesday.  Patient is concerned that he broke his ribs as a rib out of place.  He does not have any shortness of breath.  Has not had any hemoptysis.  He denies any other injuries.  It does hurt help with the right posterior rib area.  Home Medications Prior to Admission medications   Medication Sig Start Date End Date Taking? Authorizing Provider  etodolac (LODINE) 300 MG capsule Take 1 capsule (300 mg total) by mouth every 8 (eight) hours. 12/29/21  Yes Dorie Rank, MD  lidocaine (LIDODERM) 5 % Place 1 patch onto the skin daily. Remove & Discard patch within 12 hours or as directed by MD 12/29/21  Yes Dorie Rank, MD  acetaminophen (TYLENOL) 500 MG tablet Take 500 mg by mouth every 6 (six) hours as needed for moderate pain or headache.     [provider]  aspirin 81 MG chewable tablet Chew 81 mg by mouth 2 (two) times daily.    [provider]  carvedilol (COREG) 6.25 MG tablet TAKE 1 TABLET BY MOUTH TWICE A DAY 07/21/21   Larey Dresser, MD  dapagliflozin propanediol (FARXIGA) 10 MG TABS tablet Take 1 tablet (10 mg total) by mouth daily before breakfast. 06/17/21   Larey Dresser, MD  famotidine (PEPCID) 20 MG tablet Take 20 mg by mouth 2 (two) times daily.    [provider]  furosemide (LASIX) 40 MG tablet Take 1 tablet (40 mg total) by mouth every morning AND 0.5 tablets (20 mg total) every evening. 06/17/21   Larey Dresser, MD  oxymetazoline (AFRIN) 0.05 % nasal spray Place 1 spray into both nostrils 2  (two) times daily as needed for congestion.    [provider]  sacubitril-valsartan (ENTRESTO) 49-51 MG Take 1 tablet by mouth 2 (two) times daily. 06/17/21   Larey Dresser, MD  spironolactone (ALDACTONE) 25 MG tablet TAKE 1 TABLET (25 MG TOTAL) BY MOUTH DAILY. 07/21/21   Larey Dresser, MD      Allergies    Patient has no known allergies.    Review of Systems   Review of Systems  Physical Exam Updated Vital Signs BP (!) 131/119 (BP Location: Left Arm)   Pulse 71   Temp 98 F (36.7 C) (Oral)   Resp 19   Ht 1.727 m ('5\' 8"'$ )   Wt 129.3 kg   SpO2 95%   BMI 43.33 kg/m  Physical Exam Vitals and nursing note reviewed.  Constitutional:      General: He is not in acute distress.    Appearance: He is well-developed.  HENT:     Head: Normocephalic and atraumatic.     Right Ear: External ear normal.     Left Ear: External ear normal.  Eyes:     General: No scleral icterus.       Right eye: No discharge.        Left eye: No discharge.  Conjunctiva/sclera: Conjunctivae normal.  Neck:     Trachea: No tracheal deviation.  Cardiovascular:     Rate and Rhythm: Normal rate and regular rhythm.  Pulmonary:     Effort: Pulmonary effort is normal. No respiratory distress.     Breath sounds: Normal breath sounds. No stridor. No wheezing.  Chest:     Comments: Tenderness palpation right posterior rib region Abdominal:     General: There is no distension.  Musculoskeletal:        General: No swelling or deformity.     Cervical back: Neck supple.  Skin:    General: Skin is warm and dry.     Findings: No rash.  Neurological:     Mental Status: He is alert.     Cranial Nerves: Cranial nerve deficit: no gross deficits.     ED Results / Procedures / Treatments   Labs (all labs ordered are listed, but only abnormal results are displayed) Labs Reviewed - No data to display  EKG None  Radiology DG Ribs Unilateral W/Chest Right  Result Date: 12/29/2021 CLINICAL  DATA:  Fall, right rib pain EXAM: RIGHT RIBS AND CHEST - 3+ VIEW COMPARISON:  Chest 10/13/2017 FINDINGS: No fracture or other bone lesions are seen involving the ribs. There is no evidence of pneumothorax or pleural effusion. Both lungs are clear. Heart size and mediastinal contours are within normal limits. IMPRESSION: Negative. Electronically Signed   By: Franchot Gallo M.D.   On: 12/29/2021 16:05    Procedures Procedures    Medications Ordered in ED Medications - No data to display  ED Course/ Medical Decision Making/ A&P Clinical Course as of 12/29/21 1622  Wed Dec 29, 2021  1612 DG Ribs Unilateral W/Chest Right Chest x-ray without signs of rib fracture.  No evidence of lung injury [JK]    Clinical Course User Index [JK] Dorie Rank, MD                           Medical Decision Making Problems Addressed: Fall, initial encounter: acute illness or injury Rib contusion, right, initial encounter: acute illness or injury  Amount and/or Complexity of Data Reviewed Radiology: ordered and independent interpretation performed. Decision-making details documented in ED Course.  Risk Prescription drug management.   Patient's ED work-up is reassuring.  No signs of serious injury on the chest x-ray.    Evaluation and diagnostic testing in the emergency department does not suggest an emergent condition requiring admission or immediate intervention beyond what has been performed at this time.  The patient is safe for discharge and has been instructed to return immediately for worsening symptoms, change in symptoms or any other concerns.        Final Clinical Impression(s) / ED Diagnoses Final diagnoses:  Fall, initial encounter  Rib contusion, right, initial encounter    Rx / DC Orders ED Discharge Orders          Ordered    lidocaine (LIDODERM) 5 %  Every 24 hours        12/29/21 1620    etodolac (LODINE) 300 MG capsule  Every 8 hours       Note to Pharmacy: As needed for  pain   12/29/21 1620              Dorie Rank, MD 12/29/21 1622

## 2021-12-29 NOTE — Discharge Instructions (Signed)
Take the medications to help with your pain and discomfort.  The symptoms should improve over the next couple of weeks.  Follow-up with your doctor to be rechecked if not getting better

## 2021-12-29 NOTE — ED Triage Notes (Signed)
Pt fell on Monday night and felt pop right rib on Tuesday.  Pt ambulated with his cane to triage room.  Pt fell after tripping on some tree roots while outside. Denies hitting his head, denies taking blood thinners.

## 2022-01-20 ENCOUNTER — Encounter (HOSPITAL_COMMUNITY): Payer: Self-pay | Admitting: Cardiology

## 2022-01-20 ENCOUNTER — Ambulatory Visit (HOSPITAL_COMMUNITY)
Admission: RE | Admit: 2022-01-20 | Discharge: 2022-01-20 | Disposition: A | Discharge status: Home / Self Care | Payer: Medicare Other | Source: Ambulatory Visit | Attending: Cardiology | Admitting: Cardiology

## 2022-01-20 VITALS — BP 104/60 | HR 84 | Wt 286.4 lb

## 2022-01-20 DIAGNOSIS — I5022 Chronic systolic (congestive) heart failure: Secondary | ICD-10-CM | POA: Diagnosis present

## 2022-01-20 DIAGNOSIS — R0602 Shortness of breath: Secondary | ICD-10-CM | POA: Diagnosis not present

## 2022-01-20 DIAGNOSIS — K7460 Unspecified cirrhosis of liver: Secondary | ICD-10-CM | POA: Diagnosis not present

## 2022-01-20 DIAGNOSIS — G629 Polyneuropathy, unspecified: Secondary | ICD-10-CM | POA: Diagnosis not present

## 2022-01-20 DIAGNOSIS — E119 Type 2 diabetes mellitus without complications: Secondary | ICD-10-CM | POA: Diagnosis not present

## 2022-01-20 DIAGNOSIS — E669 Obesity, unspecified: Secondary | ICD-10-CM | POA: Diagnosis not present

## 2022-01-20 DIAGNOSIS — G4733 Obstructive sleep apnea (adult) (pediatric): Secondary | ICD-10-CM | POA: Diagnosis not present

## 2022-01-20 DIAGNOSIS — C320 Malignant neoplasm of glottis: Secondary | ICD-10-CM | POA: Insufficient documentation

## 2022-01-20 DIAGNOSIS — Z79899 Other long term (current) drug therapy: Secondary | ICD-10-CM | POA: Insufficient documentation

## 2022-01-20 DIAGNOSIS — I714 Abdominal aortic aneurysm, without rupture, unspecified: Secondary | ICD-10-CM | POA: Diagnosis not present

## 2022-01-20 DIAGNOSIS — I428 Other cardiomyopathies: Secondary | ICD-10-CM | POA: Insufficient documentation

## 2022-01-20 DIAGNOSIS — F17200 Nicotine dependence, unspecified, uncomplicated: Secondary | ICD-10-CM | POA: Diagnosis not present

## 2022-01-20 DIAGNOSIS — I5042 Chronic combined systolic (congestive) and diastolic (congestive) heart failure: Secondary | ICD-10-CM

## 2022-01-20 DIAGNOSIS — I7121 Aneurysm of the ascending aorta, without rupture: Secondary | ICD-10-CM

## 2022-01-20 LAB — BASIC METABOLIC PANEL
Anion gap: 10 (ref 5–15)
BUN: 11 mg/dL (ref 8–23)
CO2: 23 mmol/L (ref 22–32)
Calcium: 9.1 mg/dL (ref 8.9–10.3)
Chloride: 107 mmol/L (ref 98–111)
Creatinine, Ser: 0.71 mg/dL (ref 0.61–1.24)
GFR, Estimated: >60 mL/min (ref >60)
Glucose, Bld: 93 mg/dL (ref 70–99)
Potassium: 4.3 mmol/L (ref 3.5–5.1)
Sodium: 140 mmol/L (ref 135–145)

## 2022-01-20 LAB — HEMOGLOBIN A1C
Hgb A1c MFr Bld: 5.4 % (ref 4.8–5.6)
Mean Plasma Glucose: 108.28 mg/dL

## 2022-01-20 LAB — BRAIN NATRIURETIC PEPTIDE: B Natriuretic Peptide: 11.6 pg/mL (ref 0.0–100.0)

## 2022-01-20 LAB — VITAMIN B12: Vitamin B-12: 435 pg/mL (ref 180–914)

## 2022-01-20 MED ORDER — DAPAGLIFLOZIN PROPANEDIOL 10 MG PO TABS: 10.0000 mg | ORAL_TABLET | Freq: Every day | ORAL | 7 refills | Status: DC

## 2022-01-20 MED ORDER — FUROSEMIDE 40 MG PO TABS: 40.0000 mg | ORAL_TABLET | Freq: Every day | ORAL | 7 refills | Status: DC

## 2022-01-20 NOTE — Patient Instructions (Signed)
Good to see you today!   START Farxiga 10 mg daily  DECREASE Lasix to 40 mg daily  Labs done today, your results will be available in MyChart, we will contact you for abnormal readings.  MRA of chest ordered once approved by insurance we will call to schedule  Your physician recommends that you schedule a follow-up appointment in: 6 months with app(April 2024) call in February 2024 to schedule an appointment  If you have any questions or concerns before your next appointment please send [place] a message through [place] or call our office at [phone].    TO LEAVE A MESSAGE FOR THE NURSE SELECT OPTION 2, PLEASE LEAVE A MESSAGE INCLUDING: YOUR NAME DATE OF BIRTH CALL BACK NUMBER REASON FOR CALL**this is important as we prioritize the call backs  YOU WILL RECEIVE A CALL BACK THE SAME DAY AS LONG AS YOU CALL BEFORE 4:00 PM  At the [facility], you and your health needs are our priority. As part of our continuing mission to provide you with exceptional heart care, we have created designated Provider Care Teams. These Care Teams include your primary Cardiologist (physician) and Advanced Practice Providers (APPs- Physician Assistants and Nurse Practitioners) who all work together to provide you with the care you need, when you need it.   You may see any of the following providers on your designated Care Team at your next follow up: Dr Glori Bickers Dr Loralie Champagne Dr. Roxana Hires, NP Lyda Jester, [place] [facility] [place], [place] Forestine Na, NP Audry Riles, PharmD   Please be sure to bring in all your medications bottles to every appointment.  Do the following things EVERYDAY: Weigh yourself in the morning before breakfast. Write it down and keep it in a log. Take your medicines as prescribed Eat low salt foods--Limit salt (sodium) to 2000 mg per day.  Stay as active as you can everyday Limit all fluids for the day to less than 2  liters

## 2022-01-20 NOTE — Progress Notes (Signed)
PCP: Patient, No Pcp Per Primary Cardiologist: Dr Aundra Dubin   HPI: Mr Hoffert is a 71 y.o. with history of NICM, smoking, cirrhosis, and systolic heart failure.   Admitted 10/13/2017 with increase SOB/CP. HF team consulted. RHC/LHC as noted below with normal cors and adequate cardiac output. SBP was soft so no BB was added. Placed on low dose losartn and spiro. Discharge weight 260 pounds.   Echo in 11/19 showed EF up to 35%, diffuse hypokinesis.  Echo in 11/20 showed EF up to 45-50% with normal RV.   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   Echo in 2/22 showed EF 55%, normal RV size and systolic function, 4.5 cm aortic root. Echo in 4/23 showed EF 50-55%, normal RV, ascending aorta 4.3 cm.   Today he returns for followup of CHF.  Weight is up 2 lbs.  He tripped on a root and fell a couple of weeks ago, with worsening of his back pain that is just now starting to improve.  He has had a long history of sciatica.  He generally is not short of breath walking on flat ground.  He is short of breath walking up stairs or hills.  No orthopnea/PND. No palpitations.  No lightheadedness. Not using CPAP, needs repair of machine.  He reports bothersome tingling in his feet.   Labs (7/19): BNP 43, K 4.1, creatinine 0.91 Labs (11/19): K 4.2, creatinine 0.82 Labs (2/20): K 4.1, creatinine 1.11 Labs (1/21): K 4, creatinine 0.86 Labs (9/21): K 4.5, creatinine 0.72 Labs (8/22): K 4.5, creatinine 0.79 Labs (4/23): K 4, creatinine 0.73  PMH: 1. Chronic systolic CHF: Nonischemic cardiomyopathy.  - LHC/RHC (7/19): No significant coronary disease.  Mean RA 13, PA 44/19, mean PCWP 31, CI 3.48.  - Cardiac MRI (7/19): EF 18%, mild-moderately decreased RV systolic function, ascending aorta 4.4 cm.  - Echo (11/19): EF 35%, diffuse hypokinesis, mild LVH, aortic root 4.3 cm, normal RV size and systolic function.  - Echo (11/20): EF 45-50%, mild LV dilation, normal RV  size and systolic function.  - Echo (2/22): EF 55%, normal RV, 4.5 cm aortic root.  - Echo (4/23): EF 50-55%, normal RV, ascending aorta 4.3 cm 2. Dilated aortic root/ascending aorta: 4.4 cm on cardiac MRI 7/19.  - MRA chest (11/20): 4.4 cm aortic root, 4.1 cm ascending aorta.  - Echo (2/22): 4.5 cm aortic root.  - MRA (3/22): 4.5 cm aortic root.  3. OSA: Severe on 8/19 sleep study. Has not been using CPAP.   4. Cirrhosis: Viral hepatitis labs negative, no history of heavy ETOH.  5. Nephrolithiasis.  6. 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   ROS: All systems negative except as listed in HPI, PMH and Problem List.  Social History   Socioeconomic History   Marital status: Divorced    Spouse name: Not on file   Number of children: Not on file   Years of education: Not on file   Highest education level: Not on file  Occupational History   Not on file  Tobacco Use   Smoking status: Former    Packs/day: 1.00    Years: 50.00    Total pack years: 50.00    Types: Cigarettes    Start date: 5    Quit date: 04/04/2016    Years since quitting: 5.8   Smokeless tobacco: Never  Vaping Use   Vaping Use: Former  Substance and Sexual Activity  Alcohol use: Yes    Comment: social   Drug use: Never    Comment: remotely in the 1970's   Sexual activity: Not on file  Other Topics Concern   Not on file  Social History Narrative   Not on file   Social Determinants of Health   Financial Resource Strain: Low Risk  (06/17/2021)   Overall Financial Resource Strain (CARDIA)    Difficulty of Paying Living Expenses: Not hard at all  Food Insecurity: No Food Insecurity (06/17/2021)   Hunger Vital Sign    Worried About Running Out of Food in the Last Year: Never true    [place] in the Last Year: Never true  Transportation Needs: No Transportation Needs (06/17/2021)   PRAPARE - Hydrologist (Medical): No    Lack of Transportation  (Non-Medical): No  Physical Activity: Not on file  Stress: Not on file  Social Connections: Not on file  Intimate Partner Violence: Not on file    Family History  Problem Relation Age of Onset   Dementia Mother    Heart disease Father    Lung cancer Brother        SCLC    Current Outpatient Medications  Medication Sig Dispense Refill   acetaminophen (TYLENOL) 500 MG tablet Take 500 mg by mouth every 6 (six) hours as needed for moderate pain or headache.      aspirin 81 MG chewable tablet Chew 81 mg by mouth 2 (two) times daily.     carvedilol (COREG) 6.25 MG tablet TAKE 1 TABLET BY MOUTH TWICE A DAY 180 tablet 3   dapagliflozin propanediol (FARXIGA) 10 MG TABS tablet Take 1 tablet (10 mg total) by mouth daily before breakfast. 30 tablet 7   famotidine (PEPCID) 20 MG tablet Take 20 mg by mouth 2 (two) times daily.     lidocaine (LIDODERM) 5 % Place 1 patch onto the skin daily. Remove & Discard patch within 12 hours or as directed by MD 14 patch 0   oxymetazoline (AFRIN) 0.05 % nasal spray Place 1 spray into both nostrils 2 (two) times daily as needed for congestion.     sacubitril-valsartan (ENTRESTO) 49-51 MG Take 1 tablet by mouth 2 (two) times daily. 60 tablet 11   spironolactone (ALDACTONE) 25 MG tablet TAKE 1 TABLET (25 MG TOTAL) BY MOUTH DAILY. 90 tablet 3   etodolac (LODINE) 300 MG capsule Take 1 capsule (300 mg total) by mouth every 8 (eight) hours. (Patient not taking: Reported on 01/20/2022) 21 capsule 0   furosemide (LASIX) 40 MG tablet Take 1 tablet (40 mg total) by mouth daily. 30 tablet 7   No current facility-administered medications for this encounter.    Vitals:   01/20/22 1139  BP: 104/60  Pulse: 84  SpO2: 94%  Weight: 129.9 kg (286 lb 6.4 oz)   Wt Readings from Last 3 Encounters:  01/20/22 129.9 kg (286 lb 6.4 oz)  12/29/21 129.3 kg (285 lb)  07/21/21 129.1 kg (284 lb 9.6 oz)    PHYSICAL EXAM: General: NAD Neck: No JVD, no thyromegaly or thyroid  nodule.  Lungs: Clear to auscultation bilaterally with normal respiratory effort. CV: Nondisplaced PMI.  Heart regular S1/S2, no S3/S4, no murmur.  Trace ankle edema.  No carotid bruit.  Normal pedal pulses.  Abdomen: Soft, nontender, no hepatosplenomegaly, no distention.  Skin: Intact without lesions or rashes.  Neurologic: Alert and oriented x 3.  Psych: Normal affect. Extremities: No clubbing  or cyanosis.  HEENT: Normal.   ASSESSMENT & PLAN: 1. Chronic systolic CHF: Echo 7/10 with EF 15-20%.  Nonischemic cardiomyopathy, no significant CAD on coronary angiography in 7/19.  No strong family history of cardiomyopathy.  No ETOH or drug abuse.  Cardiac MRI with EF 18%, no LGE (7/19).  RHC (7/19) with preserved cardiac output and elevated filling pressures. Possible viral myocarditis.  Echo in 11/19 showed EF up to 35%.  Echo in 11/20 showed EF 45-50%.  Echo in 2/22 showed EF up to 55%. Echo in 4/23 with EF 50-55%, normal RV, ascending aorta 4.3 cm.  NYHA class II.  He is not volume overloaded on exam.  - I am going to have him restart dapagliflozin 10 mg daily and decrease Lasix to 40 mg daily. BMET/BNP today and in 10 days.    - Continue spironolactone 25 mg daily.  - Increase Entresto to 49/51 bid  - Continue Coreg 6.25 mg bid  2. Cirrhosis: Noted on CTA. Viral hepatitis labs negative.  No history of heavy ETOH. Liver MRI showed cirrhosis and liver cysts.   3. OSA: Severe.  Needs to restart CPAP.   4. Dilated aortic root: MRA 3/22 showed 4.5 cm aortic root.  - I will arrange for MRA chest to follow.    5. Obesity: He was unable to get semaglutide.  Discussed diet/exercise.  6. Peripheral neuropathy: He has bothersome tingling in his feet bilaterally. This could be related to his lumbar spine arthritis.  - Check B12 level and hgbA1c.   Followup 6 months with APP  Loralie Champagne  01/20/2022

## 2022-02-25 ENCOUNTER — Ambulatory Visit (HOSPITAL_COMMUNITY): Payer: Medicare Other

## 2022-03-02 ENCOUNTER — Ambulatory Visit (HOSPITAL_COMMUNITY): Payer: Medicare Other

## 2022-03-11 ENCOUNTER — Ambulatory Visit (HOSPITAL_COMMUNITY): Payer: Medicare Other

## 2022-03-25 ENCOUNTER — Ambulatory Visit (HOSPITAL_COMMUNITY): Payer: Medicare Other

## 2022-04-05 ENCOUNTER — Ambulatory Visit (HOSPITAL_COMMUNITY)
Admission: RE | Admit: 2022-04-05 | Discharge: 2022-04-05 | Disposition: A | Discharge status: Home / Self Care | Payer: Medicare Other | Source: Ambulatory Visit | Attending: Cardiology | Admitting: Cardiology

## 2022-04-05 DIAGNOSIS — I7121 Aneurysm of the ascending aorta, without rupture: Secondary | ICD-10-CM | POA: Insufficient documentation

## 2022-04-05 MED ORDER — GADOBUTROL 1 MMOL/ML IV SOLN
10.0000 mL | Freq: Once | INTRAVENOUS | Status: AC | PRN
  Administered 2022-04-05: 10 mL via INTRAVENOUS

## 2022-04-07 ENCOUNTER — Telehealth (HOSPITAL_COMMUNITY): Payer: Self-pay

## 2022-04-07 NOTE — Telephone Encounter (Addendum)
Pt aware, agreeable, and verbalized understanding   ----- Message from Larey Dresser, MD sent at 04/06/2022  2:17 PM EST ----- 4.0 cm stable thoracic aortic aneurysm

## 2022-07-17 ENCOUNTER — Other Ambulatory Visit (HOSPITAL_COMMUNITY): Payer: Self-pay | Admitting: Cardiology

## 2022-08-01 ENCOUNTER — Ambulatory Visit (HOSPITAL_COMMUNITY)
Admission: RE | Admit: 2022-08-01 | Discharge: 2022-08-01 | Disposition: A | Discharge status: Home / Self Care | Payer: Medicare Other | Source: Ambulatory Visit | Attending: Cardiology | Admitting: Cardiology

## 2022-08-01 ENCOUNTER — Encounter (HOSPITAL_COMMUNITY): Payer: Self-pay | Admitting: Cardiology

## 2022-08-01 VITALS — BP 100/60 | HR 66 | Wt 288.2 lb

## 2022-08-01 DIAGNOSIS — G4733 Obstructive sleep apnea (adult) (pediatric): Secondary | ICD-10-CM | POA: Diagnosis not present

## 2022-08-01 DIAGNOSIS — I77819 Aortic ectasia, unspecified site: Secondary | ICD-10-CM | POA: Diagnosis not present

## 2022-08-01 DIAGNOSIS — Z87891 Personal history of nicotine dependence: Secondary | ICD-10-CM | POA: Insufficient documentation

## 2022-08-01 DIAGNOSIS — I5042 Chronic combined systolic (congestive) and diastolic (congestive) heart failure: Secondary | ICD-10-CM | POA: Diagnosis present

## 2022-08-01 DIAGNOSIS — I428 Other cardiomyopathies: Secondary | ICD-10-CM | POA: Insufficient documentation

## 2022-08-01 DIAGNOSIS — Z79899 Other long term (current) drug therapy: Secondary | ICD-10-CM | POA: Insufficient documentation

## 2022-08-01 DIAGNOSIS — E669 Obesity, unspecified: Secondary | ICD-10-CM | POA: Insufficient documentation

## 2022-08-01 DIAGNOSIS — K7460 Unspecified cirrhosis of liver: Secondary | ICD-10-CM | POA: Diagnosis not present

## 2022-08-01 DIAGNOSIS — I5022 Chronic systolic (congestive) heart failure: Secondary | ICD-10-CM | POA: Diagnosis not present

## 2022-08-01 DIAGNOSIS — Z8249 Family history of ischemic heart disease and other diseases of the circulatory system: Secondary | ICD-10-CM | POA: Diagnosis not present

## 2022-08-01 LAB — LIPID PANEL
Cholesterol: 128 mg/dL (ref 0–200)
HDL: 44 mg/dL (ref >40)
LDL Cholesterol: 76 mg/dL (ref 0–99)
Total CHOL/HDL Ratio: 2.9 RATIO
Triglycerides: 41 mg/dL (ref <150)
VLDL: 8 mg/dL (ref 0–40)

## 2022-08-01 LAB — HEMOGLOBIN A1C
Hgb A1c MFr Bld: 5 % (ref 4.8–5.6)
Mean Plasma Glucose: 96.8 mg/dL

## 2022-08-01 LAB — BASIC METABOLIC PANEL
Anion gap: 8 (ref 5–15)
BUN: 9 mg/dL (ref 8–23)
CO2: 24 mmol/L (ref 22–32)
Calcium: 8.8 mg/dL — ABNORMAL LOW (ref 8.9–10.3)
Chloride: 105 mmol/L (ref 98–111)
Creatinine, Ser: 0.69 mg/dL (ref 0.61–1.24)
GFR, Estimated: >60 mL/min (ref >60)
Glucose, Bld: 112 mg/dL — ABNORMAL HIGH (ref 70–99)
Potassium: 4.1 mmol/L (ref 3.5–5.1)
Sodium: 137 mmol/L (ref 135–145)

## 2022-08-01 NOTE — Progress Notes (Signed)
PCP: Patient, No Pcp Per Primary Cardiologist: Dr Shirlee Latch   HPI: David Castaneda is a 72 y.o. with history of NICM, smoking, cirrhosis, and systolic heart failure.   Admitted 10/13/2017 with increase SOB/CP. HF team consulted. RHC/LHC as noted below with normal cors and adequate cardiac output. SBP was soft so no BB was added. Placed on low dose losartn and spiro. Discharge weight 260 pounds.   Echo in 11/19 showed EF up to 35%, diffuse hypokinesis.  Echo in 11/20 showed EF up to 45-50% with normal RV.   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   Echo in 2/22 showed EF 55%, normal RV size and systolic function, 4.5 cm aortic root. Echo in 4/23 showed EF 50-55%, normal RV, ascending aorta 4.3 cm.   Today he returns for followup of CHF.  Weight is up 2 lbs.  Main complaint is low back pain with sciatica.  This is being followed at the [place].  He is limited by back pain, has minimal exertional dyspnea.  No chest pain.  He has some generalized fatigue.  No orthopnea/PND. He stopped [place] due to severe lightheadedness with its use despite decreasing Lasix. He is not using CPAP, says it did not work well for him.   Labs (7/19): BNP 43, K 4.1, creatinine 0.91 Labs (11/19): K 4.2, creatinine 0.82 Labs (2/20): K 4.1, creatinine 1.11 Labs (1/21): K 4, creatinine 0.86 Labs (9/21): K 4.5, creatinine 0.72 Labs (8/22): K 4.5, creatinine 0.79 Labs (4/23): K 4, creatinine 0.73 Labs (10/23): BNP 11.6, K 4.3, creatinine 0.7  PMH: 1. Chronic systolic CHF: Nonischemic cardiomyopathy.  - LHC/RHC (7/19): No significant coronary disease.  Mean RA 13, PA 44/19, mean PCWP 31, CI 3.48.  - Cardiac MRI (7/19): EF 18%, mild-moderately decreased RV systolic function, ascending aorta 4.4 cm.  - Echo (11/19): EF 35%, diffuse hypokinesis, mild LVH, aortic root 4.3 cm, normal RV size and systolic function.  - Echo (11/20): EF 45-50%, mild LV dilation, normal RV size and  systolic function.  - Echo (2/22): EF 55%, normal RV, 4.5 cm aortic root.  - Echo (4/23): EF 50-55%, normal RV, ascending aorta 4.3 cm 2. Dilated aortic root/ascending aorta: 4.4 cm on cardiac MRI 7/19.  - MRA chest (11/20): 4.4 cm aortic root, 4.1 cm ascending aorta.  - Echo (2/22): 4.5 cm aortic root.  - MRA (3/22): 4.5 cm aortic root.  - MRA (1/24): 4 cm ascending aorta 3. OSA: Severe on 8/19 sleep study. Has not been using CPAP.   4. Cirrhosis: Viral hepatitis labs negative, no history of heavy ETOH.  5. Nephrolithiasis.  6. 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   ROS: All systems negative except as listed in HPI, PMH and Problem List.  Social History   Socioeconomic History   Marital status: Divorced    Spouse name: Not on file   Number of children: Not on file   Years of education: Not on file   Highest education level: Not on file  Occupational History   Not on file  Tobacco Use   Smoking status: Former    Packs/day: 1.00    Years: 50.00    Additional pack years: 0.00    Total pack years: 50.00    Types: Cigarettes    Start date: 75    Quit date: 04/04/2016    Years since quitting: 6.3   Smokeless tobacco: Never  Vaping Use  Vaping Use: Former  Substance and Sexual Activity   Alcohol use: Yes    Comment: social   Drug use: Never    Comment: remotely in the 1970's   Sexual activity: Not on file  Other Topics Concern   Not on file  Social History Narrative   Not on file   Social Determinants of Health   Financial Resource Strain: Low Risk  (06/17/2021)   Overall Financial Resource Strain (CARDIA)    Difficulty of Paying Living Expenses: Not hard at all  Food Insecurity: No Food Insecurity (06/17/2021)   Hunger Vital Sign    Worried About Running Out of Food in the Last Year: Never true    Ran Out of Food in the Last Year: Never true  Transportation Needs: No Transportation Needs (06/17/2021)   PRAPARE - Scientist, research (physical sciences) (Medical): No    Lack of Transportation (Non-Medical): No  Physical Activity: Not on file  Stress: Not on file  Social Connections: Not on file  Intimate Partner Violence: Not on file    Family History  Problem Relation Age of Onset   Dementia Mother    Heart disease Father    Lung cancer Brother        SCLC    Current Outpatient Medications  Medication Sig Dispense Refill   acetaminophen (TYLENOL) 500 MG tablet Take 500 mg by mouth every 6 (six) hours as needed for moderate pain or headache.      aspirin 81 MG chewable tablet Chew 81 mg by mouth 2 (two) times daily.     carvedilol (COREG) 6.25 MG tablet TAKE 1 TABLET BY MOUTH TWICE A DAY 180 tablet 3   famotidine (PEPCID) 20 MG tablet Take 20 mg by mouth 2 (two) times daily.     fluticasone (FLONASE) 50 MCG/ACT nasal spray Place 1 spray into both nostrils daily.     furosemide (LASIX) 40 MG tablet TAKE 1 TABLET (40 MG TOTAL) BY MOUTH EVERY MORNING AND 0.5 TABLETS (20 MG TOTAL) EVERY EVENING. 135 tablet 3   sacubitril-valsartan (ENTRESTO) 49-51 MG Take 1 tablet by mouth 2 (two) times daily. 60 tablet 11   spironolactone (ALDACTONE) 25 MG tablet TAKE 1 TABLET (25 MG TOTAL) BY MOUTH DAILY. 90 tablet 3   No current facility-administered medications for this encounter.    Vitals:   08/01/22 1122  BP: 100/60  Pulse: 66  SpO2: 94%  Weight: 130.7 kg (288 lb 3.2 oz)   Wt Readings from Last 3 Encounters:  08/01/22 130.7 kg (288 lb 3.2 oz)  01/20/22 129.9 kg (286 lb 6.4 oz)  12/29/21 129.3 kg (285 lb)    PHYSICAL EXAM: General: NAD Neck: No JVD, no thyromegaly or thyroid nodule.  Lungs: Clear to auscultation bilaterally with normal respiratory effort. CV: Nondisplaced PMI.  Heart regular S1/S2, no S3/S4, no murmur.  No peripheral edema.  No carotid bruit.  Normal pedal pulses.  Abdomen: Soft, nontender, no hepatosplenomegaly, no distention.  Skin: Intact without lesions or rashes.  Neurologic: Alert and oriented  x 3.  Psych: Normal affect. Extremities: No clubbing or cyanosis.  HEENT: Normal.   ASSESSMENT & PLAN: 1. Chronic systolic CHF: Echo 7/19 with EF 15-20%.  Nonischemic cardiomyopathy, no significant CAD on coronary angiography in 7/19.  No strong family history of cardiomyopathy.  No ETOH or drug abuse.  Cardiac MRI with EF 18%, no LGE (7/19).  RHC (7/19) with preserved cardiac output and elevated filling pressures. Possible viral myocarditis.  Echo in 11/19 showed EF up to 35%.  Echo in 11/20 showed EF 45-50%.  Echo in 2/22 showed EF up to 55%. Echo in 4/23 with EF 50-55%, normal RV, ascending aorta 4.3 cm.  NYHA class II.  He is not volume overloaded.  - Continue Lasix 40 qam/20 qpm.  BMET/BNP today.  - Continue spironolactone 25 mg daily.  - Continue Entresto 49/51 bid  - Continue Coreg 6.25 mg bid  - He did not tolerate Farxiga. - Echo at followup in 4 months. 2. Cirrhosis: Noted on CTA. Viral hepatitis labs negative.  No history of heavy ETOH. Liver MRI showed cirrhosis and liver cysts.   3. OSA: Severe.  Needs to restart CPAP => refer back to sleep medicine.   4. Dilated aortic root/ascending aorta: MRA 3/22 showed 4.5 cm aortic root. MRA 1/24 with 4 cm ascending aorta.  5. Obesity: I will see if we can get him semaglutide or tirzepatide through our pharmacy clinic. Will check hgbA1c.   Followup 4 months with echo  Marca Ancona  08/01/2022

## 2022-08-01 NOTE — Patient Instructions (Signed)
There has been no changes to your medications.  Labs done today, your results will be available in MyChart, we will contact you for abnormal readings.  Your physician has requested that you have an echocardiogram. Echocardiography is a painless test that uses sound waves to create images of your heart. It provides your doctor with information about the size and shape of your heart and how well your heart's chambers and valves are working. This procedure takes approximately one hour. There are no restrictions for this procedure. Please do NOT wear cologne, perfume, aftershave, or lotions (deodorant is allowed). Please arrive 15 minutes prior to your appointment time.  You have been referred back to Dr. Mayford Knife for your CPAP. Her office will call you to arrange your appointment.  Your physician recommends that you schedule a follow-up appointment in: 4 months with an echocardiogram (August) ** please call the office in June to arrange your follow up appointment. **  If you have any questions or concerns before your next appointment please send [place] a message through [place] or call our office at [phone].    TO LEAVE A MESSAGE FOR THE NURSE SELECT OPTION 2, PLEASE LEAVE A MESSAGE INCLUDING: YOUR NAME DATE OF BIRTH CALL BACK NUMBER REASON FOR CALL**this is important as we prioritize the call backs  YOU WILL RECEIVE A CALL BACK THE SAME DAY AS LONG AS YOU CALL BEFORE 4:00 PM  At the [facility], you and your health needs are our priority. As part of our continuing mission to provide you with exceptional heart care, we have created designated Provider Care Teams. These Care Teams include your primary Cardiologist (physician) and Advanced Practice Providers (APPs- Physician Assistants and Nurse Practitioners) who all work together to provide you with the care you need, when you need it.   You may see any of the following providers on your designated Care Team at your next follow  up: Dr Arvilla Meres Dr Marca Ancona Dr. Marcos Eke, NP Robbie Lis, [place] [facility] [place], [place] Brynda Peon, NP Karle Plumber, PharmD   Please be sure to bring in all your medications bottles to every appointment.    Thank you for choosing [facility]

## 2022-09-22 ENCOUNTER — Other Ambulatory Visit (HOSPITAL_COMMUNITY): Payer: Self-pay | Admitting: Cardiology

## 2022-10-21 ENCOUNTER — Encounter: Payer: Self-pay | Admitting: Cardiology

## 2022-10-21 ENCOUNTER — Ambulatory Visit: Payer: Medicare Other | Admitting: Cardiology

## 2022-10-21 VITALS — BP 106/70 | HR 70 | Ht 68.0 in | Wt 295.6 lb

## 2022-10-21 DIAGNOSIS — G4733 Obstructive sleep apnea (adult) (pediatric): Secondary | ICD-10-CM | POA: Diagnosis not present

## 2022-10-21 NOTE — Progress Notes (Signed)
Sleep Medicine CONSULT Note    Date:  10/21/2022   ID:  David Castaneda, DOB Jan 29, 1951, MRN [id number]  PCP:  Patient, No Pcp Per  Cardiologist: None   Chief Complaint  Patient presents with   New Patient (Initial Visit)    OSA    History of Present Illness:  David Castaneda is a 72 y.o. male who is being seen today for the evaluation of OSA at the request of Marca Ancona, MD.  [name] is a 72 y.o. male with a hx of chronic systolic CHF who was referred by Dr. Sherlie Ban for sleep study.  His Epworth sleepiness score was 14 and he was complaining of fatigue, snoring, witnessed apnea and waking gasping for breath at night.  Study showed severe obstructive sleep apnea with an AHI of 93/h with oxygen desaturations as low as 81%.  Underwent CPAP titration to 11 cm H2O.   When I last saw him he was doing well with his CPAP but was then lost to followup and then stopped using his device.  He is now referred by Dr. Shirlee Latch for Sleep Medicine consultation to reestablish sleep care.  He has been using his CPAP device since 2020 until about a year ago and could not get any supplies and was told he needed to be seen by Sleep Medicine so he stopped using his device.  He complains that he is feeling tired during the day and now has his nights and days mixed up sleeping mainly during the day and up at night.  His wife says that he is snoring loudly at times. Prior to stopping his CPAP, he said that it was blowing too much air out and he could not tolerate it.  Past Medical History:  Diagnosis Date   CHF (congestive heart failure) (HCC)    Former smoker    History of kidney stones    OSA (obstructive sleep apnea) 06/04/2018   Severe obstructive sleep apnea with an AHI of 93/h with oxygen desaturations as low as 81%. Now on CPAP at 11 cm H2O.    Sucking chest wound    1970's [place]    Past Surgical History:  Procedure Laterality Date   CARDIAC CATHETERIZATION     patient states they went in and  looked around but never placed a stent   DENTAL SURGERY     MICROLARYNGOSCOPY WITH CO2 LASER AND EXCISION OF VOCAL CORD LESION N/A 03/15/2019   Procedure: MICROLARYNGOSCOPY WITH POSSIBLE CO2 LASER AND EXCISION OF VOCAL CORD LESION;  Surgeon: Drema Halon, MD;  Location: [facility] OR;  Service: ENT;  Laterality: N/A;   MICROLARYNGOSCOPY WITH CO2 LASER AND EXCISION OF VOCAL CORD LESION N/A 04/12/2019   Procedure: Microlaryngoscopy With Co2 Laser And Excision Of Vocal Cord;  Surgeon: Drema Halon, MD;  Location: [facility] OR;  Service: ENT;  Laterality: N/A;   wound     wound in back in war    Current Medications: No outpatient medications have been marked as taking for the 10/21/22 encounter (Office Visit) with Quintella Reichert, MD.    Allergies:   Patient has no known allergies.   Social History   Socioeconomic History   Marital status: Divorced    Spouse name: Not on file   Number of children: Not on file   Years of education: Not on file   Highest education level: Not on file  Occupational History   Not on file  Tobacco Use   Smoking status: Former  Current packs/day: 0.00    Average packs/day: 1 pack/day for 50.0 years (50.0 ttl pk-yrs)    Types: Cigarettes    Start date: 79    Quit date: 04/04/2016    Years since quitting: 6.5   Smokeless tobacco: Never  Vaping Use   Vaping status: Former  Substance and Sexual Activity   Alcohol use: Yes    Comment: social   Drug use: Never    Comment: remotely in the 1970's   Sexual activity: Not on file  Other Topics Concern   Not on file  Social History Narrative   Not on file   Social Determinants of Health   Financial Resource Strain: Low Risk  (06/17/2021)   Overall Financial Resource Strain (CARDIA)    Difficulty of Paying Living Expenses: Not hard at all  Food Insecurity: No Food Insecurity (06/17/2021)   Hunger Vital Sign    Worried About Running Out of Food in the Last Year: Never true    Ran Out of Food in the  Last Year: Never true  Transportation Needs: No Transportation Needs (06/17/2021)   PRAPARE - Administrator, Civil Service (Medical): No    Lack of Transportation (Non-Medical): No  Physical Activity: Not on file  Stress: Not on file  Social Connections: Not on file     Family History:  The patient's family history includes Dementia in his mother; Heart disease in his father; Lung cancer in his brother.   ROS:   Please see the history of present illness.    ROS All other systems reviewed and are negative.      No data to display             PHYSICAL EXAM:   VS:  There were no vitals taken for this visit.   GEN: Well nourished, well developed, in no acute distress  HEENT: normal  Neck: no JVD, carotid bruits, or masses Cardiac: RRR; no murmurs, rubs, or gallops,no edema.  Intact distal pulses bilaterally.  Respiratory:  clear to auscultation bilaterally, normal work of breathing GI: soft, nontender, nondistended, + BS MS: no deformity or atrophy  Skin: warm and dry, no rash Neuro:  Alert and Oriented x 3, Strength and sensation are intact Psych: euthymic mood, full affect  Wt Readings from Last 3 Encounters:  08/01/22 288 lb 3.2 oz (130.7 kg)  01/20/22 286 lb 6.4 oz (129.9 kg)  12/29/21 285 lb (129.3 kg)      Studies/Labs Reviewed:   none  Recent Labs: 01/20/2022: B Natriuretic Peptide 11.6 08/01/2022: BUN 9; Creatinine, Ser 0.69; Potassium 4.1; Sodium 137     ASSESSMENT:    1. OSA on CPAP      PLAN:  In order of problems listed above:  OSA  -remote sleep study in 2020 showed severe obstructive sleep apnea with an AHI of 93/h with oxygen desaturations as low as 81%.   -s/p CPAP ttitration to 11 cm H2O in 2020 and started using CPAP but was lost to followup and stopped using his device. -he stopped using his device a year ago because he could not get supplies and the pressure was too high -given his morbid obesity and cardiac issues I think  we need to start over with an in lab split night sleep study to determine the severity of OSA and best way to treat it (CPAP vs. BiPAP)  He will followup with me after he has his study  Time Spent: 20 minutes total time of  encounter, including 15 minutes spent in face-to-face patient care on the date of this encounter. This time includes coordination of care and counseling regarding above mentioned problem list. Remainder of non-face-to-face time involved reviewing chart documents/testing relevant to the patient encounter and documentation in the medical record. I have independently reviewed documentation from referring provider  Medication Adjustments/Labs and Tests Ordered: Current medicines are reviewed at length with the patient today.  Concerns regarding medicines are outlined above.  Medication changes, Labs and Tests ordered today are listed in the Patient Instructions below.  There are no Patient Instructions on file for this visit.   Signed, Armanda Magic, MD  10/21/2022 2:18 PM    [facility] [address] [place], [place], [place]  [address] Phone: [phone]; Fax: [phone]

## 2022-10-21 NOTE — Patient Instructions (Signed)
Medication Instructions:  Your physician recommends that you continue on your current medications as directed. Please refer to the Current Medication list given to you today.  *If you need a refill on your cardiac medications before your next appointment, please call your pharmacy*   Lab Work: None.  If you have labs (blood work) drawn today and your tests are completely normal, you will receive your results only by: MyChart Message (if you have MyChart) OR A paper copy in the mail If you have any lab test that is abnormal or we need to change your treatment, we will call you to review the results.   Testing/Procedures: None.   Follow-Up: At  HeartCare, you and your health needs are our priority.  As part of our continuing mission to provide you with exceptional heart care, we have created designated Provider Care Teams.  These Care Teams include your primary Cardiologist (physician) and Advanced Practice Providers (APPs -  Physician Assistants and Nurse Practitioners) who all work together to provide you with the care you need, when you need it.  We recommend signing up for the patient portal called "MyChart".  Sign up information is provided on this After Visit Summary.  MyChart is used to connect with patients for Virtual Visits (Telemedicine).  Patients are able to view lab/test results, encounter notes, upcoming appointments, etc.  Non-urgent messages can be sent to your provider as well.   To learn more about what you can do with MyChart, go to [email or web address].    Your next appointment:   1 year(s)  Provider:   Dr. Traci Turner, MD   

## 2022-10-26 ENCOUNTER — Telehealth: Payer: Self-pay | Admitting: *Deleted

## 2022-10-26 NOTE — Telephone Encounter (Addendum)
Prior Authorization for SPLIT NIGHT sent to [facility] via web portal. Tracking Number .  Prior Authorization is not required; DECISION ID [id number].

## 2022-12-07 ENCOUNTER — Ambulatory Visit (HOSPITAL_BASED_OUTPATIENT_CLINIC_OR_DEPARTMENT_OTHER)
Admission: RE | Admit: 2022-12-07 | Discharge: 2022-12-07 | Disposition: A | Discharge status: Home / Self Care | Payer: Medicare Other | Source: Ambulatory Visit | Attending: Cardiology | Admitting: Cardiology

## 2022-12-07 ENCOUNTER — Encounter (HOSPITAL_COMMUNITY): Payer: Self-pay | Admitting: Cardiology

## 2022-12-07 ENCOUNTER — Ambulatory Visit (HOSPITAL_COMMUNITY)
Admission: RE | Admit: 2022-12-07 | Discharge: 2022-12-07 | Disposition: A | Discharge status: Home / Self Care | Payer: Medicare Other | Source: Ambulatory Visit | Attending: Cardiology | Admitting: Cardiology

## 2022-12-07 VITALS — BP 110/78 | HR 68 | Wt 289.6 lb

## 2022-12-07 DIAGNOSIS — I5042 Chronic combined systolic (congestive) and diastolic (congestive) heart failure: Secondary | ICD-10-CM | POA: Diagnosis present

## 2022-12-07 DIAGNOSIS — Z79899 Other long term (current) drug therapy: Secondary | ICD-10-CM | POA: Insufficient documentation

## 2022-12-07 DIAGNOSIS — I428 Other cardiomyopathies: Secondary | ICD-10-CM | POA: Diagnosis not present

## 2022-12-07 DIAGNOSIS — G4733 Obstructive sleep apnea (adult) (pediatric): Secondary | ICD-10-CM | POA: Insufficient documentation

## 2022-12-07 DIAGNOSIS — E669 Obesity, unspecified: Secondary | ICD-10-CM | POA: Diagnosis not present

## 2022-12-07 DIAGNOSIS — M549 Dorsalgia, unspecified: Secondary | ICD-10-CM | POA: Diagnosis not present

## 2022-12-07 DIAGNOSIS — M5440 Lumbago with sciatica, unspecified side: Secondary | ICD-10-CM | POA: Diagnosis not present

## 2022-12-07 DIAGNOSIS — K7460 Unspecified cirrhosis of liver: Secondary | ICD-10-CM | POA: Insufficient documentation

## 2022-12-07 DIAGNOSIS — Z87891 Personal history of nicotine dependence: Secondary | ICD-10-CM | POA: Diagnosis not present

## 2022-12-07 LAB — BASIC METABOLIC PANEL
Anion gap: 10 (ref 5–15)
BUN: 10 mg/dL (ref 8–23)
CO2: 24 mmol/L (ref 22–32)
Calcium: 9 mg/dL (ref 8.9–10.3)
Chloride: 102 mmol/L (ref 98–111)
Creatinine, Ser: 0.66 mg/dL (ref 0.61–1.24)
GFR, Estimated: >60 mL/min (ref >60)
Glucose, Bld: 80 mg/dL (ref 70–99)
Potassium: 3.8 mmol/L (ref 3.5–5.1)
Sodium: 136 mmol/L (ref 135–145)

## 2022-12-07 LAB — ECHOCARDIOGRAM COMPLETE
AR max vel: 2.81 cm2
AV Area VTI: 2.51 cm2
AV Area mean vel: 2.34 cm2
AV Mean grad: 3 mmHg
AV Peak grad: 4.8 mmHg
Ao pk vel: 1.09 m/s
Area-P 1/2: 3.1 cm2
S' Lateral: 4.5 cm

## 2022-12-07 LAB — BRAIN NATRIURETIC PEPTIDE: B Natriuretic Peptide: 11.1 pg/mL (ref 0.0–100.0)

## 2022-12-07 NOTE — Patient Instructions (Signed)
Medication Changes:  No Changes In Medications at this time.    Lab Work:  Labs done today, your results will be available in MyChart, we will contact you for abnormal readings.  THEN RETURN IN 3 MONTHS AS SCHEDULED FOR LABS   Referrals:  YOU HAVE BEEN REFERRED TO [facility]ARMD AT [facility] THEY WILL REACH OUT TO YOU OR CALL TO ARRANGE THIS. PLEASE CALL us WITH ANY CONCERNS   Follow-Up in: 6 MONTHS WITH DR. Shirlee Latch PLEASE CALL OUR OFFICE AROUND JANUARY 2025 TO GET SCHEDULED FOR YOUR APPOINTMENT. PHONE NUMBER IS [phone] OPTION 2     At the [facility], you and your health needs are our priority. We have a designated team specialized in the treatment of Heart Failure. This Care Team includes your primary Heart Failure Specialized Cardiologist (physician), Advanced Practice Providers (APPs- Physician Assistants and Nurse Practitioners), and Pharmacist who all work together to provide you with the care you need, when you need it.   You may see any of the following providers on your designated Care Team at your next follow up:  Dr. Arvilla Meres Dr. Marca Ancona Dr. Marcos Eke, NP Robbie Lis, [place] [facility] [place], [place] Brynda Peon, NP Karle Plumber, PharmD   Please be sure to bring in all your medications bottles to every appointment.   Need to Contact us:  If you have any questions or concerns before your next appointment please send [place] a message through [place] or call our office at [phone].    TO LEAVE A MESSAGE FOR THE NURSE SELECT OPTION 2, PLEASE LEAVE A MESSAGE INCLUDING: YOUR NAME DATE OF BIRTH CALL BACK NUMBER REASON FOR CALL**this is important as we prioritize the call backs  YOU WILL RECEIVE A CALL BACK THE SAME DAY AS LONG AS YOU CALL BEFORE 4:00 PM

## 2022-12-08 NOTE — Progress Notes (Signed)
PCP: Patient, No Pcp Per Primary Cardiologist: Dr Shirlee Latch   HPI: David Castaneda is a 72 y.o. with history of NICM, smoking, cirrhosis, and systolic heart failure.   Admitted 10/13/2017 with increase SOB/CP. HF team consulted. RHC/LHC as noted below with normal cors and adequate cardiac output. Cardiac MRI showed EF 18%.  SBP was soft so no BB was added. Placed on low dose losartan and spiro. Discharge weight 260 pounds.   Echo in 11/19 showed EF up to 35%, diffuse hypokinesis.  Echo in 11/20 showed EF up to 45-50% with normal RV.   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   Echo in 2/22 showed EF 55%, normal RV size and systolic function, 4.5 cm aortic root. Echo in 4/23 showed EF 50-55%, normal RV, ascending aorta 4.3 cm.  Echo was done today and reviewed, EF 50-55% with mild LV dilation, normal RV size/systolic function, IVC normal.   Today he returns for followup of CHF.  Weight is down 3 lbs.  Main complaint remains low back pain with sciatica.  He walks with a cane due to back pain.  No exertional dyspnea but not very active.  No chest pain.  No orthopnea/PND.   Labs (7/19): BNP 43, K 4.1, creatinine 0.91 Labs (11/19): K 4.2, creatinine 0.82 Labs (2/20): K 4.1, creatinine 1.11 Labs (1/21): K 4, creatinine 0.86 Labs (9/21): K 4.5, creatinine 0.72 Labs (8/22): K 4.5, creatinine 0.79 Labs (4/23): K 4, creatinine 0.73 Labs (10/23): BNP 11.6, K 4.3, creatinine 0.7 Labs (4/24): LDL 76, K 4.1, creatinine 1.61  ECG (personally reviewed): NSR, 1st degree AVB, inferior and anterior Qs  PMH: 1. Chronic systolic CHF: Nonischemic cardiomyopathy.  - LHC/RHC (7/19): No significant coronary disease.  Mean RA 13, PA 44/19, mean PCWP 31, CI 3.48.  - Cardiac MRI (7/19): EF 18%, mild-moderately decreased RV systolic function, ascending aorta 4.4 cm.  - Echo (11/19): EF 35%, diffuse hypokinesis, mild LVH, aortic root 4.3 cm, normal RV size and systolic  function.  - Echo (11/20): EF 45-50%, mild LV dilation, normal RV size and systolic function.  - Echo (2/22): EF 55%, normal RV, 4.5 cm aortic root.  - Echo (4/23): EF 50-55%, normal RV, ascending aorta 4.3 cm - Echo (9/24): EF 50-55% with mild LV dilation, normal RV size/systolic function, IVC normal. 2. Dilated aortic root/ascending aorta: 4.4 cm on cardiac MRI 7/19.  - MRA chest (11/20): 4.4 cm aortic root, 4.1 cm ascending aorta.  - Echo (2/22): 4.5 cm aortic root.  - MRA (3/22): 4.5 cm aortic root.  - MRA (1/24): 4 cm ascending aorta 3. OSA: Severe on 8/19 sleep study. Has not been using CPAP.   4. Cirrhosis: Viral hepatitis labs negative, no history of heavy ETOH. ?NAFLD.  5. Nephrolithiasis.  6. 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   ROS: All systems negative except as listed in HPI, PMH and Problem List.  Social History   Socioeconomic History   Marital status: Divorced    Spouse name: Not on file   Number of children: Not on file   Years of education: Not on file   Highest education level: Not on file  Occupational History   Not on file  Tobacco Use   Smoking status: Former    Current packs/day: 0.00    Average packs/day: 1 pack/day for 50.0 years (50.0 ttl pk-yrs)    Types: Cigarettes    Start date: 75  Quit date: 04/04/2016    Years since quitting: 6.6   Smokeless tobacco: Never  Vaping Use   Vaping status: Former  Substance and Sexual Activity   Alcohol use: Yes    Comment: social   Drug use: Never    Comment: remotely in the 1970's   Sexual activity: Not on file  Other Topics Concern   Not on file  Social History Narrative   Not on file   Social Determinants of Health   Financial Resource Strain: Low Risk  (06/17/2021)   Overall Financial Resource Strain (CARDIA)    Difficulty of Paying Living Expenses: Not hard at all  Food Insecurity: No Food Insecurity (06/17/2021)   Hunger Vital Sign    Worried About Running Out of  Food in the Last Year: Never true    Ran Out of Food in the Last Year: Never true  Transportation Needs: No Transportation Needs (06/17/2021)   PRAPARE - Administrator, Civil Service (Medical): No    Lack of Transportation (Non-Medical): No  Physical Activity: Not on file  Stress: Not on file  Social Connections: Not on file  Intimate Partner Violence: Not on file    Family History  Problem Relation Age of Onset   Dementia Mother    Heart disease Father    Lung cancer Brother        SCLC    Current Outpatient Medications  Medication Sig Dispense Refill   acetaminophen (TYLENOL) 500 MG tablet Take 500 mg by mouth every 6 (six) hours as needed for moderate pain or headache.      aspirin 81 MG chewable tablet Chew 81 mg by mouth 2 (two) times daily.     carvedilol (COREG) 6.25 MG tablet TAKE 1 TABLET BY MOUTH TWICE A DAY 180 tablet 3   ENTRESTO 49-51 MG TAKE 1 TABLET BY MOUTH TWICE A DAY 60 tablet 11   famotidine (PEPCID) 20 MG tablet Take 20 mg by mouth 2 (two) times daily.     fluticasone (FLONASE) 50 MCG/ACT nasal spray Place 1 spray into both nostrils daily.     furosemide (LASIX) 40 MG tablet TAKE 1 TABLET (40 MG TOTAL) BY MOUTH EVERY MORNING AND 0.5 TABLETS (20 MG TOTAL) EVERY EVENING. 135 tablet 3   spironolactone (ALDACTONE) 25 MG tablet TAKE 1 TABLET (25 MG TOTAL) BY MOUTH DAILY. 90 tablet 3   No current facility-administered medications for this encounter.    Vitals:   12/07/22 1338  BP: 110/78  Pulse: 68  SpO2: 94%  Weight: 131.4 kg (289 lb 9.6 oz)   Wt Readings from Last 3 Encounters:  12/07/22 131.4 kg (289 lb 9.6 oz)  10/21/22 134.1 kg (295 lb 9.6 oz)  08/01/22 130.7 kg (288 lb 3.2 oz)    PHYSICAL EXAM: General: NAD Neck: No JVD, no thyromegaly or thyroid nodule.  Lungs: Clear to auscultation bilaterally with normal respiratory effort. CV: Nondisplaced PMI.  Heart regular S1/S2, no S3/S4, no murmur.  1+ edema 1/2 to knees.  No carotid bruit.   Normal pedal pulses.  Abdomen: Soft, nontender, no hepatosplenomegaly, no distention.  Skin: Intact without lesions or rashes.  Neurologic: Alert and oriented x 3.  Psych: Normal affect. Extremities: No clubbing or cyanosis.  HEENT: Normal.   ASSESSMENT & PLAN: 1. Chronic systolic CHF: Echo 7/19 with EF 15-20%.  Nonischemic cardiomyopathy, no significant CAD on coronary angiography in 7/19.  No strong family history of cardiomyopathy.  No ETOH or drug abuse.  Cardiac  MRI with EF 18%, no LGE (7/19).  RHC (7/19) with preserved cardiac output and elevated filling pressures. Possible viral myocarditis.  Echo in 11/19 showed EF up to 35%.  Echo in 11/20 showed EF 45-50%.  Echo in 2/22 showed EF up to 55%. Echo in 4/23 with EF 50-55%, normal RV, ascending aorta 4.3 cm. Echo today was stable with EF 50-55% with mild LV dilation, normal RV size/systolic function, IVC normal.  NYHA class II.  He is not volume overloaded on exam though not very active due to back pain.  - Continue Lasix 40 qam/20 qpm with ongoing peripheral edema.  BMET/BNP today.  - Continue spironolactone 25 mg daily.  - Continue Entresto 49/51 bid  - Continue Coreg 6.25 mg bid  - He did not tolerate [place]. 2. Cirrhosis: Noted on CTA. Viral hepatitis labs negative.  No history of heavy ETOH. ?NAFLD.  Liver MRI showed cirrhosis and liver cysts.   3. OSA: Severe.  Needs to restart CPAP => has a repeat sleep study coming up.   4. Dilated aortic root/ascending aorta: MRA 3/22 showed 4.5 cm aortic root. MRA 1/24 with 4 cm ascending aorta.  5. Obesity: I will refer to pharmacy clinic to see if we can get him semaglutide or tirzepatide.  Weight loss would likely help with his back pain.   Followup 6 months with APP.   Marca Ancona  12/08/2022

## 2022-12-20 ENCOUNTER — Ambulatory Visit: Payer: Medicare Other | Attending: Cardiology | Admitting: Cardiology

## 2022-12-20 ENCOUNTER — Encounter (HOSPITAL_BASED_OUTPATIENT_CLINIC_OR_DEPARTMENT_OTHER): Payer: Medicare Other

## 2022-12-20 DIAGNOSIS — I493 Ventricular premature depolarization: Secondary | ICD-10-CM | POA: Insufficient documentation

## 2022-12-20 DIAGNOSIS — G4733 Obstructive sleep apnea (adult) (pediatric): Secondary | ICD-10-CM | POA: Diagnosis not present

## 2022-12-20 DIAGNOSIS — G479 Sleep disorder, unspecified: Secondary | ICD-10-CM | POA: Diagnosis not present

## 2022-12-20 DIAGNOSIS — G4763 Sleep related bruxism: Secondary | ICD-10-CM | POA: Diagnosis not present

## 2022-12-20 DIAGNOSIS — R0683 Snoring: Secondary | ICD-10-CM | POA: Diagnosis present

## 2022-12-27 ENCOUNTER — Ambulatory Visit: Payer: Medicare Other | Attending: Cardiovascular Disease | Admitting: Student

## 2022-12-27 ENCOUNTER — Encounter: Payer: Self-pay | Admitting: Student

## 2022-12-27 ENCOUNTER — Telehealth: Payer: Self-pay | Admitting: Pharmacy Technician

## 2022-12-27 ENCOUNTER — Other Ambulatory Visit (HOSPITAL_COMMUNITY): Payer: Self-pay

## 2022-12-27 ENCOUNTER — Telehealth: Payer: Self-pay | Admitting: Pharmacist

## 2022-12-27 DIAGNOSIS — Z6841 Body Mass Index (BMI) 40.0 and over, adult: Secondary | ICD-10-CM

## 2022-12-27 NOTE — Procedures (Signed)
Patient Name: David Castaneda, Bur Date: 12/20/2022 Gender: Male D.O.B: 1951/03/31 Age (years): 60 Referring Provider: Armanda Magic MD, ABSM Height (inches): 69 Interpreting Physician: Armanda Magic MD, ABSM Weight (lbs): 289 RPSGT: Alfonso Ellis BMI: 43 MRN: [id number] Neck Size: 16.50  CLINICAL INFORMATION Sleep Study Type: NPSG  Indication for sleep study: Obesity, Snoring  Epworth Sleepiness Score: 12  Most recent polysomnogram dated 11/24/2017 revealed an AHI of 93.0/h and RDI of 93.2/h. Most recent titration study dated 02/16/2018 was optimal at 11cm H2O with an AHI of 15.3/h.  SLEEP STUDY TECHNIQUE As per the AASM Manual for the Scoring of Sleep and Associated Events v2.3 (April 2016) with a hypopnea requiring 4% desaturations.  The channels recorded and monitored were frontal, central and occipital EEG, electrooculogram (EOG), submentalis EMG (chin), nasal and oral airflow, thoracic and abdominal wall motion, anterior tibialis EMG, snore microphone, electrocardiogram, and pulse oximetry.  MEDICATIONS Medications self-administered by patient taken the night of the study : N/A  SLEEP ARCHITECTURE The study was initiated at 10:48:00 PM and ended at 5:21:25 AM.  Sleep onset time was 7.8 minutes and the sleep efficiency was 38.0%. The total sleep time was 149.5 minutes.  Stage REM latency was N/A minutes.  The patient spent 15.72% of the night in stage N1 sleep, 84.28% in stage N2 sleep, 0.00% in stage N3 and 0% in REM.  Alpha intrusion was absent.  Supine sleep was 100.00%.  RESPIRATORY PARAMETERS The overall apnea/hypopnea index (AHI) was 30.9 per hour. There were 6 total apneas, including 6 obstructive, 0 central and 0 mixed apneas. There were 71 hypopneas and 70 RERAs.  The AHI during Stage REM sleep was N/A per hour.  AHI while supine was 30.9 per hour.  The mean oxygen saturation was 93.18%. The minimum SpO2 during sleep was 88.00%.  moderate snoring was  noted during this study.  CARDIAC DATA The 2 lead EKG demonstrated sinus rhythm. The mean heart rate was 60.36 beats per minute. Other EKG findings include: PVCs.  LEG MOVEMENT DATA The total PLMS were 310 with a resulting PLMS index of 124.41. Associated arousal with leg movement index was 24.5 .  IMPRESSIONS - Severe obstructive sleep apnea occurred during this study (AHI = 30.9/h). - Mild oxygen desaturation was noted during this study (Min O2 = 88.00%). - The patient snored with moderate snoring volume. - EKG findings include PVCs. - Periodic limb movements of sleep occurred during the study. Associated arousals were significant.  DIAGNOSIS - Obstructive Sleep Apnea (G47.33)  RECOMMENDATIONS - Therapeutic CPAP titration to determine optimal pressure required to alleviate sleep disordered breathing. - Positional therapy avoiding supine position during sleep. - Avoid alcohol, sedatives and other CNS depressants that may worsen sleep apnea and disrupt normal sleep architecture. - Sleep hygiene should be reviewed to assess factors that may improve sleep quality. - Weight management and regular exercise should be initiated or continued if appropriate.  [Electronically signed] 12/27/2022 09:45 AM  Armanda Magic MD, ABSM Diplomate, American Board of Sleep Medicine NPI: [id number]

## 2022-12-27 NOTE — Progress Notes (Signed)
Patient ID: David Castaneda                 DOB: 07/16/1950                    MRN: [id number]     HPI: David Castaneda is a 72 y.o. male patient referred to pharmacy clinic by Dr.McLean to initiate GLP1-RA therapy. PMH is significant for hx of NSTEMI s/p right heart cath and coronary angiography,HF, OSA, and obesity. Most recent BMI 42.66 weight 289 lbs .  Baseline weight and BMI: 42.66 Current weight and BMI: 42.66 Current meds that affect weight: none  Goal weight 240 lbs to 220 lbs   Patient presented today with his friend. He has been suffering from sciatica pain. He has tried various diet plan in the past but unable to achieve any meaningful weight loss. In 2019 he had heart attack and that has weaken his heart. He is unable to do exercise due to SOB and sciatica pain. He want to achieve weight around 220 to 240 lbs.  Diet: breakfast: nothing  Lunch: burger or processed food  Supper: processed food  Once a week eats out at [place] place Coffee intake - none   Exercise: none willing to start walking 1 block every day   Family History:  Relation Problem Comments  Mother Dementia     Father Heart disease     Brother Lung cancer SCLC     Social History:  3 margarita on weekend Smoking: none   Labs: Lab Results  Component Value Date   HGBA1C 5.0 08/01/2022    Wt Readings from Last 1 Encounters:  12/07/22 289 lb 9.6 oz (131.4 kg)    BP Readings from Last 1 Encounters:  12/07/22 110/78   Pulse Readings from Last 1 Encounters:  12/07/22 68       Component Value Date/Time   CHOL 128 08/01/2022 1213   TRIG 41 08/01/2022 1213   HDL 44 08/01/2022 1213   CHOLHDL 2.9 08/01/2022 1213   VLDL 8 08/01/2022 1213   LDLCALC 76 08/01/2022 1213    Past Medical History:  Diagnosis Date   CHF (congestive heart failure) (HCC)    Former smoker    History of kidney stones    OSA (obstructive sleep apnea) 06/04/2018   Severe obstructive sleep apnea with an AHI of 93/h with oxygen  desaturations as low as 81%. Now on CPAP at 11 cm H2O.    Sucking chest wound    1970's [place]    Current Outpatient Medications on File Prior to Visit  Medication Sig Dispense Refill   acetaminophen (TYLENOL) 500 MG tablet Take 500 mg by mouth every 6 (six) hours as needed for moderate pain or headache.      aspirin 81 MG chewable tablet Chew 81 mg by mouth 2 (two) times daily.     carvedilol (COREG) 6.25 MG tablet TAKE 1 TABLET BY MOUTH TWICE A DAY 180 tablet 3   ENTRESTO 49-51 MG TAKE 1 TABLET BY MOUTH TWICE A DAY 60 tablet 11   famotidine (PEPCID) 20 MG tablet Take 20 mg by mouth 2 (two) times daily.     fluticasone (FLONASE) 50 MCG/ACT nasal spray Place 1 spray into both nostrils daily.     furosemide (LASIX) 40 MG tablet TAKE 1 TABLET (40 MG TOTAL) BY MOUTH EVERY MORNING AND 0.5 TABLETS (20 MG TOTAL) EVERY EVENING. 135 tablet 3   spironolactone (ALDACTONE) 25 MG tablet TAKE 1 TABLET (25  MG TOTAL) BY MOUTH DAILY. 90 tablet 3   No current facility-administered medications on file prior to visit.    No Known Allergies   Assessment/Plan:  1. Weight loss - Patient has not met goal of at least 5% of body weight loss with comprehensive lifestyle modifications alone in the past 3-6 months. Pharmacotherapy is appropriate to pursue as augmentation. Will assess Wegovy coverage. Confirmed patient has no personal or family history of medullary thyroid carcinoma (MTC) or Multiple Endocrine Neoplasia syndrome type 2 (MEN 2). Injection technique reviewed at today's visit.  Advised patient on common side effects including nausea, diarrhea, dyspepsia, decreased appetite, and fatigue. Counseled patient on reducing meal size and how to titrate medication to minimize side effects. Counseled patient to call if intolerable side effects or if experiencing dehydration, abdominal pain, or dizziness. Patient will adhere to dietary modifications and will target at least 150 minutes of moderate intensity  exercise weekly.   Follow up in 1 month via telephone for tolerability update and dose titration.

## 2022-12-27 NOTE — Telephone Encounter (Signed)
Pharmacy Patient Advocate Encounter   Received notification from Pt Calls Messages that prior authorization for wegovy is required/requested.   Insurance verification completed.   The patient is insured through [facility] .   Per test claim: PA required; PA started via CoverMyMeds. KEY [id number] . Waiting for clinical questions to populate.

## 2022-12-27 NOTE — Patient Instructions (Signed)
GLP1 Agonist Titration Plan:  Will plan to follow the titration plan as below, pending patient is tolerating each dose before increasing to the next. Can slow titration if needed for tolerability.   Wegovy:  -Month 1: Inject Wegovy 0.25 mg once weekly x 4 weeks -Month 2: Inject Wegovy 0.5 mg once weekly x 4 weeks -Month 3: Inject Wegovy 1 mg once weekly x 4 weeks -Month 4: Inject QIHKVQ 1.7mg  SQ once weekly x 4 weeks -Month 5+: Inject Wegovy 2.4mg  SQ once weekly

## 2022-12-28 ENCOUNTER — Other Ambulatory Visit (HOSPITAL_COMMUNITY): Payer: Self-pay

## 2022-12-28 NOTE — Telephone Encounter (Signed)
Pharmacy Patient Advocate Encounter  Received notification from [facility] that Prior Authorization for wegovy has been APPROVED from 12/27/22 to 04/04/23. Ran test claim, Copay is $380.42-GAP . This test claim was processed through [facility]- copay amounts may vary at other pharmacies due to pharmacy/plan contracts, or as the patient moves through the different stages of their insurance plan.   PA #/Case ID/Reference #: [id number]

## 2022-12-29 NOTE — Telephone Encounter (Signed)
[facility] approved

## 2023-01-02 NOTE — Telephone Encounter (Signed)
Spoke to patient, about PA approval and co-pay. States he can not afford Co-pay of $380 per month so will ask [facility] if there is any help for co-pay he can get. Will call back in 2 weeks to follow up on this issue.

## 2023-01-09 ENCOUNTER — Telehealth: Payer: Self-pay | Admitting: *Deleted

## 2023-01-09 DIAGNOSIS — I5021 Acute systolic (congestive) heart failure: Secondary | ICD-10-CM

## 2023-01-09 DIAGNOSIS — I5042 Chronic combined systolic (congestive) and diastolic (congestive) heart failure: Secondary | ICD-10-CM

## 2023-01-09 DIAGNOSIS — G4733 Obstructive sleep apnea (adult) (pediatric): Secondary | ICD-10-CM

## 2023-01-09 NOTE — Telephone Encounter (Signed)
The patient has been notified of the result and verbalized understanding.  All questions (if any) were answered. David Castaneda, CMA 01/09/2023 4:16 PM    Will precert titration

## 2023-01-09 NOTE — Telephone Encounter (Signed)
-----   Message from Armanda Magic sent at 12/27/2022  9:47 AM EDT ----- Please let patient know that they have sleep apnea.  Recommend therapeutic CPAP titration for treatment of patient's sleep disordered breathing.  If unable to perform an in lab titration then initiate ResMed auto CPAP from 4 to 15cm H2O with heated humidity and mask of choice and overnight pulse ox on CPAP.

## 2023-01-30 ENCOUNTER — Ambulatory Visit (HOSPITAL_BASED_OUTPATIENT_CLINIC_OR_DEPARTMENT_OTHER): Payer: Medicare Other | Attending: Cardiology | Admitting: Internal Medicine

## 2023-01-30 VITALS — Ht 68.0 in | Wt 282.0 lb

## 2023-01-30 DIAGNOSIS — G4733 Obstructive sleep apnea (adult) (pediatric): Secondary | ICD-10-CM | POA: Diagnosis not present

## 2023-01-30 DIAGNOSIS — I5042 Chronic combined systolic (congestive) and diastolic (congestive) heart failure: Secondary | ICD-10-CM | POA: Diagnosis present

## 2023-02-03 NOTE — Procedures (Signed)
     Patient Name: David, Castaneda Date: 01/30/2023 Gender: Male D.O.B: 1950-12-04 Age (years): 34 Referring Provider: Armanda Magic MD, ABSM Height (inches): 69 Interpreting Physician: Armanda Magic MD, ABSM Weight (lbs): 282 RPSGT: Lowry Ram BMI: 42 MRN: [id number] Neck Size: 16.50  CLINICAL INFORMATION The patient is referred for a CPAP titration to treat sleep apnea.  SLEEP STUDY TECHNIQUE As per the AASM Manual for the Scoring of Sleep and Associated Events v2.3 (April 2016) with a hypopnea requiring 4% desaturations.  The channels recorded and monitored were frontal, central and occipital EEG, electrooculogram (EOG), submentalis EMG (chin), nasal and oral airflow, thoracic and abdominal wall motion, anterior tibialis EMG, snore microphone, electrocardiogram, and pulse oximetry. Continuous positive airway pressure (CPAP) was initiated at the beginning of the study and titrated to treat sleep-disordered breathing.  MEDICATIONS Medications self-administered by patient taken the night of the study : N/A  TECHNICIAN COMMENTS Comments added by technician: Patient had difficulty initiating sleep. Patient was restless all through the night. Comments added by scorer: N/A  RESPIRATORY PARAMETERS Optimal PAP Pressure (cm):  AHI at Optimal Pressure (/hr):N/A Overall Minimal O2 (%):89.0  Supine % at Optimal Pressure (%):N/A Minimal O2 at Optimal Pressure (%): 89.0   SLEEP ARCHITECTURE The study was initiated at 10:18:05 PM and ended at 5:20:24 AM.  Sleep onset time was 6.5 minutes and the sleep efficiency was 50.1%. The total sleep time was 211.5 minutes.  The patient spent 15.6% of the night in stage N1 sleep, 69.0% in stage N2 sleep, 0.0% in stage N3 and 15.4% in REM.Stage REM latency was 178.0 minutes  Wake after sleep onset was 204.3. Alpha intrusion was absent. Supine sleep was 30.50%.  CARDIAC DATA The 2 lead EKG demonstrated sinus rhythm. The mean heart rate  was 61.8 beats per minute. Other EKG findings include: PVCs.  LEG MOVEMENT DATA The total Periodic Limb Movements of Sleep (PLMS) were 0. The PLMS index was 0.0. A PLMS index of <15 is considered normal in adults.  IMPRESSIONS - An optimal PAP pressure could not be selected for this patient based on the available study data. - Central sleep apnea was not noted during this titration (CAI = 3.1/h). - Mild oxygen desaturations were observed during this titration (min O2 = 89.0%). - The patient snored with soft snoring volume during this titration study. - 2-lead EKG demonstrated: PVCs - Clinically significant periodic limb movements were not noted during this study. Arousals associated with PLMs were rare.  DIAGNOSIS  - Obstructive Sleep Apnea (G47.33) RECOMMENDATIONS - Recommend a trial of Auto-CPAP from 4 to 15cm H2O. Patient did not tolerate CPAP. - Avoid alcohol, sedatives and other CNS depressants that may worsen sleep apnea and disrupt normal sleep architecture. - Sleep hygiene should be reviewed to assess factors that may improve sleep quality. - Weight management and regular exercise should be initiated or continued. - Return to [facility] Center for re-evaluation after 4 weeks of therapy  [Electronically signed] 02/03/2023 12:45 PM  Armanda Magic MD, ABSM Diplomate, American Board of Sleep Medicine

## 2023-02-13 ENCOUNTER — Telehealth: Payer: Self-pay | Admitting: *Deleted

## 2023-02-13 NOTE — Telephone Encounter (Signed)
-----   Message from Armanda Magic sent at 02/03/2023 12:47 PM EDT ----- Please let patient know that they had a successful PAP titration and let DME know that orders are in EPIC.  Please set up 6 week OV with me.

## 2023-02-13 NOTE — Telephone Encounter (Signed)
The patient/Friend Cala Bradford) has been notified of the result and verbalized understanding.  All questions (if any) were answered. Latrelle Dodrill, CMA 02/13/2023 12:53 PM

## 2023-02-13 NOTE — Telephone Encounter (Signed)
The FRIEND [facility]) /patient has been notified of the result and verbalized understanding.  All questions (if any) were answered. Latrelle Dodrill, CMA 02/13/2023 1:02 PM    Upon patient request DME selection is [facility] Patient understands he will be contacted by [facility] to set up his cpap. Patient understands to call if [facility] does not contact him with new setup in a timely manner. Patient understands they will be called once confirmation has been received from [place] that they have received their new machine to schedule 10 week follow up appointment.   [facility] notified of new cpap order  Please add to airview Patient was grateful for the call and thanked me   Rx has been mailed via fax.

## 2023-03-08 ENCOUNTER — Ambulatory Visit (HOSPITAL_COMMUNITY)
Admission: RE | Admit: 2023-03-08 | Discharge: 2023-03-08 | Disposition: A | Discharge status: Home / Self Care | Payer: Medicare Other | Source: Ambulatory Visit | Attending: Cardiology | Admitting: Cardiology

## 2023-03-08 DIAGNOSIS — I5042 Chronic combined systolic (congestive) and diastolic (congestive) heart failure: Secondary | ICD-10-CM | POA: Insufficient documentation

## 2023-03-08 LAB — BASIC METABOLIC PANEL
Anion gap: 9 (ref 5–15)
BUN: 12 mg/dL (ref 8–23)
CO2: 24 mmol/L (ref 22–32)
Calcium: 9 mg/dL (ref 8.9–10.3)
Chloride: 104 mmol/L (ref 98–111)
Creatinine, Ser: 0.76 mg/dL (ref 0.61–1.24)
GFR, Estimated: >60 mL/min (ref >60)
Glucose, Bld: 105 mg/dL — ABNORMAL HIGH (ref 70–99)
Potassium: 3.9 mmol/L (ref 3.5–5.1)
Sodium: 137 mmol/L (ref 135–145)

## 2023-04-20 ENCOUNTER — Other Ambulatory Visit (HOSPITAL_COMMUNITY): Payer: Self-pay | Admitting: Cardiology

## 2023-05-24 ENCOUNTER — Ambulatory Visit: Payer: Medicare Other | Admitting: Family Medicine

## 2023-05-25 ENCOUNTER — Encounter: Payer: Self-pay | Admitting: Family Medicine

## 2023-05-25 ENCOUNTER — Ambulatory Visit (INDEPENDENT_AMBULATORY_CARE_PROVIDER_SITE_OTHER): Payer: Medicare Other | Admitting: Family Medicine

## 2023-05-25 DIAGNOSIS — Z136 Encounter for screening for cardiovascular disorders: Secondary | ICD-10-CM

## 2023-05-25 DIAGNOSIS — I77810 Thoracic aortic ectasia: Secondary | ICD-10-CM | POA: Diagnosis not present

## 2023-05-25 DIAGNOSIS — K7460 Unspecified cirrhosis of liver: Secondary | ICD-10-CM | POA: Insufficient documentation

## 2023-05-25 DIAGNOSIS — M5442 Lumbago with sciatica, left side: Secondary | ICD-10-CM | POA: Diagnosis not present

## 2023-05-25 DIAGNOSIS — M5441 Lumbago with sciatica, right side: Secondary | ICD-10-CM

## 2023-05-25 DIAGNOSIS — G8929 Other chronic pain: Secondary | ICD-10-CM

## 2023-05-25 DIAGNOSIS — R351 Nocturia: Secondary | ICD-10-CM | POA: Diagnosis not present

## 2023-05-25 DIAGNOSIS — Z23 Encounter for immunization: Secondary | ICD-10-CM

## 2023-05-25 MED ORDER — PREDNISONE 20 MG PO TABS: 40.0000 mg | ORAL_TABLET | Freq: Every day | ORAL | 0 refills | Status: AC

## 2023-05-25 NOTE — Patient Instructions (Signed)
Nice meeting you today!  Follow up sooner if back pain continues and we can look into other options!

## 2023-05-25 NOTE — Progress Notes (Signed)
New Patient Office Visit  Subjective   Patient ID: David Castaneda, male    DOB: [birth year]  Age: 73 y.o. MRN: [id number]  CC:  Chief Complaint  Patient presents with   New Patient (Initial Visit)    Establish care Cough    HPI David Castaneda presents to establish care He has a PMH of CHF, OSA, morbid obesity, NSTEMI s/p RHC. He is established with Cardiology, Dr.  Shirlee Latch Would like Handicap placard  History of larygeal cancer. Does not currently follow with oncology. States that they did not have any monitoring set up for him. Reports that he is in remission.   States that he has low back pain, bilateral hips, states that it radiates down to his knee. Reports that it alternates what sides. Walks with a can.   Occupation: Retired from [employer], Agricultural consultant (pottery)  Marital status: none  Diet: states that he is trying to cut back. [facility] to help with weight loss and CHF Exercise: none, states that he has injuries from [place] and sciatica pain that limits him. States that he would like to get out and walk, but has also been limited on the weather.  Substance use: margarita once in a while  Last [facility]: [facility]TD with [facility]  [facility] with [facility]  Last dental exam: none - dentures cared for by [facility]  Last colonoscopy: no - working to get one [facility], believes it is in June.  PSA: denies family history  Gets up several time per night to urinate.  Refills needed today: none  Other specialists seen: [facility], [facility]  Dermatology exam: will think about it  Fasting today:  yes  Immunizations needed: Flu Vaccine: feels that he has had it.   Tdap Vaccine: believes he completed with [facility], will try to request records.   - every 59yrs - (<3 lifetime doses or unknown): all wounds -- look up need for Tetanus IG - (>=3 lifetime doses): clean/minor wound if >58yrs from previous; all other wounds if >65yrs from previous Zoster Vaccine: due (those >50yo,  once) Pneumonia Vaccine: believes that he completed with [facility], will try to collect records. (those w/ risk factors) - (<60yr) Both: Immunocompromised, cochlear implant, CSF leak, asplenic, sickle cell, Chronic Renal Failure - (<14yr) PPSV-23 only: Heart dz, lung disease, DM, tobacco abuse, alcoholism, cirrhosis/liver disease. - (>73yr): PPSV13 then PPSV23 in 6-12mths;  - (>54yr): repeat PPSV23 once if pt received prior to 73yo and 81yrs have passed  Outpatient Encounter Medications as of 05/25/2023  Medication Sig   acetaminophen (TYLENOL) 500 MG tablet Take 500 mg by mouth every 6 (six) hours as needed for moderate pain or headache.    aspirin 81 MG chewable tablet Chew 81 mg by mouth 2 (two) times daily.   carvedilol (COREG) 6.25 MG tablet TAKE 1 TABLET BY MOUTH TWICE A DAY   ENTRESTO 49-51 MG TAKE 1 TABLET BY MOUTH TWICE A DAY   famotidine (PEPCID) 20 MG tablet Take 20 mg by mouth 2 (two) times daily.   fluticasone (FLONASE) 50 MCG/ACT nasal spray Place 1 spray into both nostrils daily.   furosemide (LASIX) 40 MG tablet TAKE 1 TABLET BY MOUTH EVERY DAY   spironolactone (ALDACTONE) 25 MG tablet TAKE 1 TABLET (25 MG TOTAL) BY MOUTH DAILY.   No facility-administered encounter medications on file as of 05/25/2023.    Past Medical History:  Diagnosis Date   CHF (congestive heart failure) (HCC)    Former  smoker    History of kidney stones    OSA (obstructive sleep apnea) 06/04/2018   Severe obstructive sleep apnea with an AHI of 93/h with oxygen desaturations as low as 81%. Now on CPAP at 11 cm H2O.    Sucking chest wound    1970's [place]   Past Surgical History:  Procedure Laterality Date   CARDIAC CATHETERIZATION     patient states they went in and looked around but never placed a stent   DENTAL SURGERY     MICROLARYNGOSCOPY WITH CO2 LASER AND EXCISION OF VOCAL CORD LESION N/A 03/15/2019   Procedure: MICROLARYNGOSCOPY WITH POSSIBLE CO2 LASER AND EXCISION OF VOCAL CORD LESION;  Surgeon:  Drema Halon, MD;  Location: [facility] OR;  Service: ENT;  Laterality: N/A;   MICROLARYNGOSCOPY WITH CO2 LASER AND EXCISION OF VOCAL CORD LESION N/A 04/12/2019   Procedure: Microlaryngoscopy With Co2 Laser And Excision Of Vocal Cord;  Surgeon: Drema Halon, MD;  Location: [facility] OR;  Service: ENT;  Laterality: N/A;   wound     wound in back in war    Family History  Problem Relation Age of Onset   Dementia Mother    Heart disease Father    Lung cancer Brother        SCLC    Social History   Socioeconomic History   Marital status: Divorced    Spouse name: Not on file   Number of children: Not on file   Years of education: Not on file   Highest education level: Not on file  Occupational History   Not on file  Tobacco Use   Smoking status: Former    Current packs/day: 0.00    Average packs/day: 1 pack/day for 50.0 years (50.0 ttl pk-yrs)    Types: Cigarettes    Start date: 21    Quit date: 04/04/2016    Years since quitting: 7.1   Smokeless tobacco: Never  Vaping Use   Vaping status: Former  Substance and Sexual Activity   Alcohol use: Yes    Comment: social   Drug use: Never    Comment: remotely in the 1970's   Sexual activity: Not on file  Other Topics Concern   Not on file  Social History Narrative   Not on file   Social Drivers of Health   Financial Resource Strain: Low Risk  (06/17/2021)   Overall Financial Resource Strain (CARDIA)    Difficulty of Paying Living Expenses: Not hard at all  Food Insecurity: No Food Insecurity (05/25/2023)   Hunger Vital Sign    Worried About Running Out of Food in the Last Year: Never true    Ran Out of Food in the Last Year: Never true  Transportation Needs: No Transportation Needs (05/25/2023)   PRAPARE - Administrator, Civil Service (Medical): No    Lack of Transportation (Non-Medical): No  Physical Activity: Not on file  Stress: Not on file  Social Connections: Not on file  Intimate Partner Violence:  Not At Risk (05/25/2023)   Humiliation, Afraid, Rape, and Kick questionnaire    Fear of Current or Ex-Partner: No    Emotionally Abused: No    Physically Abused: No    Sexually Abused: No    ROS As per HPI   Objective   BP 110/67   Pulse 71   Temp 98.2 F (36.8 C)   Ht 5\' 8"  (1.727 m)   Wt 284 lb (128.8 kg)   SpO2 95%  BMI 43.18 kg/m   Physical Exam Constitutional:      General: He is awake. He is not in acute distress.    Appearance: Normal appearance. He is well-developed and well-groomed. He is morbidly obese. He is not ill-appearing, toxic-appearing or diaphoretic.     Interventions: He is not intubated. HENT:     Head:     Salivary Glands: Right salivary gland is not diffusely enlarged or tender. Left salivary gland is not diffusely enlarged or tender.     Right Ear: No laceration, drainage, swelling or tenderness. No middle ear effusion. There is no impacted cerumen. No foreign body. No mastoid tenderness. No PE tube. No hemotympanum. Tympanic membrane is not injected, scarred, perforated, erythematous, retracted or bulging.     Left Ear: No laceration, drainage, swelling or tenderness.  No middle ear effusion. There is no impacted cerumen. No foreign body. No mastoid tenderness. No PE tube. No hemotympanum. Tympanic membrane is not injected, scarred, perforated, erythematous, retracted or bulging.     Nose: No nasal deformity, septal deviation, signs of injury, laceration, nasal tenderness, mucosal edema, congestion or rhinorrhea.     Right Sinus: No maxillary sinus tenderness or frontal sinus tenderness.     Left Sinus: No maxillary sinus tenderness or frontal sinus tenderness.  Eyes:     General: Lids are normal.     Extraocular Movements:     Right eye: Normal extraocular motion.     Left eye: Normal extraocular motion.     Conjunctiva/sclera:     Right eye: Right conjunctiva is not injected. No chemosis, exudate or hemorrhage.    Left eye: Left conjunctiva is not  injected. No chemosis, exudate or hemorrhage.    Pupils: Pupils are equal, round, and reactive to light. Pupils are equal.     Right eye: Pupil is round, reactive and not sluggish. No corneal abrasion.     Left eye: Pupil is round, reactive and not sluggish. No corneal abrasion.     Funduscopic exam:    Right eye: No hemorrhage or exudate. Red reflex present.        Left eye: No hemorrhage or exudate. Red reflex present. Neck:     Thyroid: No thyroid mass or thyromegaly.     Vascular: No carotid bruit.     Trachea: Trachea and phonation normal. No tracheal tenderness, tracheostomy or tracheal deviation.  Cardiovascular:     Rate and Rhythm: Normal rate and regular rhythm.     Pulses: Normal pulses.          Radial pulses are 2+ on the right side and 2+ on the left side.       Posterior tibial pulses are 2+ on the right side and 2+ on the left side.     Heart sounds: Normal heart sounds. No murmur heard.    No gallop.  Pulmonary:     Effort: Pulmonary effort is normal. No tachypnea, bradypnea, accessory muscle usage, prolonged expiration, respiratory distress or retractions. He is not intubated.     Breath sounds: Normal breath sounds. No stridor, decreased air movement or transmitted upper airway sounds. No decreased breath sounds, wheezing, rhonchi or rales.  Abdominal:     General: Abdomen is flat. Bowel sounds are normal. There is no distension or abdominal bruit. There are no signs of injury.     Palpations: Abdomen is soft. There is no shifting dullness, fluid wave, hepatomegaly, splenomegaly, mass or pulsatile mass.     Tenderness: There is no abdominal  tenderness. There is no right CVA tenderness, left CVA tenderness, guarding or rebound.     Hernia: No hernia is present.  Musculoskeletal:     Cervical back: Full passive range of motion without pain, normal range of motion and neck supple. No edema, erythema, rigidity, torticollis or crepitus. No pain with movement. Normal range of  motion.     Right lower leg: No edema.     Left lower leg: No edema.  Lymphadenopathy:     Head:     Right side of head: No submental, submandibular, tonsillar, preauricular or posterior auricular adenopathy.     Left side of head: No submental, submandibular, tonsillar, preauricular or posterior auricular adenopathy.     Cervical: No cervical adenopathy.     Right cervical: No superficial, deep or posterior cervical adenopathy.    Left cervical: No superficial, deep or posterior cervical adenopathy.  Skin:    General: Skin is warm.     Capillary Refill: Capillary refill takes less than 2 seconds.  Neurological:     General: No focal deficit present.     Mental Status: He is alert, oriented to person, place, and time and easily aroused. Mental status is at baseline.     GCS: GCS eye subscore is 4. GCS verbal subscore is 5. GCS motor subscore is 6.     Cranial Nerves: Cranial nerves 2-12 are intact. No cranial nerve deficit or facial asymmetry.     Motor: Motor function is intact. No weakness.     Gait: Gait is intact.  Psychiatric:        Attention and Perception: Attention and perception normal.        Mood and Affect: Mood and affect normal.        Speech: Speech normal.        Behavior: Behavior normal. Behavior is cooperative.        Thought Content: Thought content normal. Thought content does not include homicidal or suicidal ideation. Thought content does not include homicidal or suicidal plan.        Cognition and Memory: Cognition and memory normal.        Judgment: Judgment normal.       05/25/2023   11:55 AM  Depression screen PHQ 2/9  Decreased Interest 0  Down, Depressed, Hopeless 0  PHQ - 2 Score 0  Altered sleeping 1  Tired, decreased energy 1  Change in appetite 0  Feeling bad or failure about yourself  0  Trouble concentrating 0  Moving slowly or fidgety/restless 0  Suicidal thoughts 0  PHQ-9 Score 2  Difficult doing work/chores Not difficult at all       05/25/2023   11:55 AM  GAD 7 : Generalized Anxiety Score  Nervous, Anxious, on Edge 0  Control/stop worrying 0  Worry too much - different things 0  Trouble relaxing 0  Restless 0  Easily annoyed or irritable 0  Afraid - awful might happen 0  Total GAD 7 Score 0  Anxiety Difficulty Not difficult at all   Assessment & Plan:  1. Morbid (severe) obesity due to excess calories (HCC) (Primary) Discussed with patient to continue healthy lifestyle choices, including diet (rich in fruits, vegetables, and lean proteins, and low in salt and simple carbohydrates) and exercise (at least 30 minutes of moderate physical activity daily). Limit beverages high is sugar. Recommended at least 80-100 oz of water daily.  - CBC with Differential/Platelet - CMP14+EGFR  2. Dilated aortic root (HCC) Established with  cardiology. Reviewed notes form Shirlee Latch, MD 12/07/23 and Mayford Knife, MD 01/30/23. Patient to continue to follow with specialty.   3. Chronic bilateral low back pain with bilateral sciatica Will start medication as below. Patient to avoid NSAIDs. Declined PT at this time.  - predniSONE (DELTASONE) 20 MG tablet; Take 2 tablets (40 mg total) by mouth daily with breakfast for 5 days.  Dispense: 10 tablet; Refill: 0  4. Nocturia Labs as below. Will communicate results to patient once available. Will await results to determine next steps.  - PSA, total and free  5. Encounter for screening for cardiovascular disorders Labs as below. Will communicate results to patient once available. Will await results to determine next steps.  - Lipid panel  6. Need for vaccination for zoster - Zoster Recombinant (Shingrix )   The above assessment and management plan was discussed with the patient. The patient verbalized understanding of and has agreed to the management plan using shared-decision making. Patient is aware to call the clinic if they develop any new symptoms or if symptoms fail to improve or worsen. Patient  is aware when to return to the clinic for a follow-up visit. Patient educated on when it is appropriate to go to the emergency department.   Return in about 6 months (around 11/22/2023) for Chronic Condition Follow up.   Neale Burly, DNP-FNP [facility] [address] [place], [place] [address] [phone]

## 2023-05-26 ENCOUNTER — Encounter: Payer: Self-pay | Admitting: Family Medicine

## 2023-05-26 LAB — CBC WITH DIFFERENTIAL/PLATELET
Basophils Absolute: 0.1 10*3/uL (ref 0.0–0.2)
Basos: 1 %
EOS (ABSOLUTE): 0.3 10*3/uL (ref 0.0–0.4)
Eos: 5 %
Hematocrit: 42.3 % (ref 37.5–51.0)
Hemoglobin: 14.1 g/dL (ref 13.0–17.7)
Immature Grans (Abs): 0 10*3/uL (ref 0.0–0.1)
Immature Granulocytes: 1 %
Lymphocytes Absolute: 1.1 10*3/uL (ref 0.7–3.1)
Lymphs: 17 %
MCH: 31.3 pg (ref 26.6–33.0)
MCHC: 33.3 g/dL (ref 31.5–35.7)
MCV: 94 fL (ref 79–97)
Monocytes Absolute: 0.8 10*3/uL (ref 0.1–0.9)
Monocytes: 12 %
Neutrophils Absolute: 4.1 10*3/uL (ref 1.4–7.0)
Neutrophils: 64 %
Platelets: 254 10*3/uL (ref 150–450)
RBC: 4.5 x10E6/uL (ref 4.14–5.80)
RDW: 12.8 % (ref 11.6–15.4)
WBC: 6.4 10*3/uL (ref 3.4–10.8)

## 2023-05-26 LAB — CMP14+EGFR
ALT: 21 [IU]/L (ref 0–44)
AST: 52 [IU]/L — ABNORMAL HIGH (ref 0–40)
Albumin: 3.6 g/dL — ABNORMAL LOW (ref 3.8–4.8)
Alkaline Phosphatase: 90 [IU]/L (ref 44–121)
BUN/Creatinine Ratio: 16 (ref 10–24)
BUN: 9 mg/dL (ref 8–27)
Bilirubin Total: 1.9 mg/dL — ABNORMAL HIGH (ref 0.0–1.2)
CO2: 24 mmol/L (ref 20–29)
Calcium: 8.9 mg/dL (ref 8.6–10.2)
Chloride: 102 mmol/L (ref 96–106)
Creatinine, Ser: 0.58 mg/dL — ABNORMAL LOW (ref 0.76–1.27)
Globulin, Total: 2.6 g/dL (ref 1.5–4.5)
Glucose: 97 mg/dL (ref 70–99)
Potassium: 4.3 mmol/L (ref 3.5–5.2)
Sodium: 139 mmol/L (ref 134–144)
Total Protein: 6.2 g/dL (ref 6.0–8.5)
eGFR: 102 mL/min/{1.73_m2} (ref >59)

## 2023-05-26 LAB — LIPID PANEL
Chol/HDL Ratio: 2.8 {ratio} (ref 0.0–5.0)
Cholesterol, Total: 133 mg/dL (ref 100–199)
HDL: 47 mg/dL (ref >39)
LDL Chol Calc (NIH): 73 mg/dL (ref 0–99)
Triglycerides: 60 mg/dL (ref 0–149)
VLDL Cholesterol Cal: 13 mg/dL (ref 5–40)

## 2023-05-26 LAB — PSA, TOTAL AND FREE
PSA, Free Pct: 15.3 %
PSA, Free: 0.26 ng/mL
Prostate Specific Ag, Serum: 1.7 ng/mL (ref 0.0–4.0)

## 2023-05-26 NOTE — Progress Notes (Signed)
Normal PSA. Cholesterol levels within normal range. However, given ASCVD risk score, would recommend that patient start a statin. If agreeable, will send in crestor. The 10-year ASCVD risk score (Arnett DK, et al., 2019) is: 16.3%. Slightly decreased creatinine, will continue to monitor. Decreased albumin, recommend patient increase protein in diet. Bilirubin slightly elevated, will continue to monitor on future labs. AST elevated, recommend patient avoid alcohol and tylenol. Will recheck in a few months to monitor trend.

## 2023-06-23 ENCOUNTER — Ambulatory Visit: Payer: Medicare Other | Admitting: Family Medicine

## 2023-06-28 ENCOUNTER — Encounter (HOSPITAL_COMMUNITY): Payer: Self-pay | Admitting: Cardiology

## 2023-06-28 ENCOUNTER — Ambulatory Visit (HOSPITAL_COMMUNITY)
Admission: RE | Admit: 2023-06-28 | Discharge: 2023-06-28 | Disposition: A | Discharge status: Home / Self Care | Payer: Medicare Other | Source: Ambulatory Visit | Attending: Cardiology | Admitting: Cardiology

## 2023-06-28 VITALS — BP 100/60 | HR 71 | Wt 287.2 lb

## 2023-06-28 DIAGNOSIS — I5042 Chronic combined systolic (congestive) and diastolic (congestive) heart failure: Secondary | ICD-10-CM | POA: Diagnosis present

## 2023-06-28 DIAGNOSIS — K7460 Unspecified cirrhosis of liver: Secondary | ICD-10-CM | POA: Diagnosis not present

## 2023-06-28 DIAGNOSIS — G4733 Obstructive sleep apnea (adult) (pediatric): Secondary | ICD-10-CM | POA: Diagnosis not present

## 2023-06-28 DIAGNOSIS — E669 Obesity, unspecified: Secondary | ICD-10-CM | POA: Insufficient documentation

## 2023-06-28 DIAGNOSIS — I428 Other cardiomyopathies: Secondary | ICD-10-CM | POA: Diagnosis not present

## 2023-06-28 DIAGNOSIS — Z79899 Other long term (current) drug therapy: Secondary | ICD-10-CM | POA: Diagnosis not present

## 2023-06-28 DIAGNOSIS — Z87891 Personal history of nicotine dependence: Secondary | ICD-10-CM | POA: Insufficient documentation

## 2023-06-28 LAB — LIPID PANEL
Cholesterol: 144 mg/dL (ref 0–200)
HDL: 45 mg/dL (ref >40)
LDL Cholesterol: 90 mg/dL (ref 0–99)
Total CHOL/HDL Ratio: 3.2 ratio
Triglycerides: 47 mg/dL (ref <150)
VLDL: 9 mg/dL (ref 0–40)

## 2023-06-28 LAB — COMPREHENSIVE METABOLIC PANEL
ALT: 21 U/L (ref 0–44)
AST: 37 U/L (ref 15–41)
Albumin: 3.2 g/dL — ABNORMAL LOW (ref 3.5–5.0)
Alkaline Phosphatase: 59 U/L (ref 38–126)
Anion gap: 8 (ref 5–15)
BUN: 9 mg/dL (ref 8–23)
CO2: 22 mmol/L (ref 22–32)
Calcium: 9.2 mg/dL (ref 8.9–10.3)
Chloride: 108 mmol/L (ref 98–111)
Creatinine, Ser: 0.61 mg/dL (ref 0.61–1.24)
GFR, Estimated: >60 mL/min (ref >60)
Glucose, Bld: 106 mg/dL — ABNORMAL HIGH (ref 70–99)
Potassium: 4.3 mmol/L (ref 3.5–5.1)
Sodium: 138 mmol/L (ref 135–145)
Total Bilirubin: 2.5 mg/dL — ABNORMAL HIGH (ref 0.0–1.2)
Total Protein: 6.4 g/dL — ABNORMAL LOW (ref 6.5–8.1)

## 2023-06-28 LAB — BRAIN NATRIURETIC PEPTIDE: B Natriuretic Peptide: 21.4 pg/mL (ref 0.0–100.0)

## 2023-06-28 NOTE — Patient Instructions (Signed)
 There has been no changes to your medications.  Labs done today, your results will be available in MyChart, we will contact you for abnormal readings.  Your provider has ordered an MRA of your chest.  ONCE APPROVED BY YOUR INSURANCE COMPANY YOU WILL BE CALLED TO HAVE THIS TEST ARRANGED.  Your physician recommends that you schedule a follow-up appointment in: 6 months.  If you have any questions or concerns before your next appointment please send [place] a message through [place] or call our office at [phone].    TO LEAVE A MESSAGE FOR THE NURSE SELECT OPTION 2, PLEASE LEAVE A MESSAGE INCLUDING: YOUR NAME DATE OF BIRTH CALL BACK NUMBER REASON FOR CALL**this is important as we prioritize the call backs  YOU WILL RECEIVE A CALL BACK THE SAME DAY AS LONG AS YOU CALL BEFORE 4:00 PM  At the [facility], you and your health needs are our priority. As part of our continuing mission to provide you with exceptional heart care, we have created designated Provider Care Teams. These Care Teams include your primary Cardiologist (physician) and Advanced Practice Providers (APPs- Physician Assistants and Nurse Practitioners) who all work together to provide you with the care you need, when you need it.   You may see any of the following providers on your designated Care Team at your next follow up: Dr Arvilla Meres Dr Marca Ancona Dr. Dorthula Nettles Dr. Clearnce Hasten Amy Filbert Schilder, NP Robbie Lis, [place] [facility] [place], [place] Brynda Peon, NP [place] Lee, NP Clarisa Kindred, NP Karle Plumber, PharmD Enos Fling, PharmD   Please be sure to bring in all your medications bottles to every appointment.    Thank you for choosing [facility]

## 2023-06-28 NOTE — Progress Notes (Signed)
 PCP: Arrie Senate, FNP Primary Cardiologist: Dr Shirlee Latch   HPI: [name] is a 73 y.o. with history of NICM, smoking, cirrhosis, and systolic heart failure.   Admitted 10/13/2017 with increase SOB/CP. HF team consulted. RHC/LHC as noted below with normal cors and adequate cardiac output. Cardiac MRI showed EF 18%.  SBP was soft so no BB was added. Placed on low dose losartan and spiro. Discharge weight 260 pounds.   Echo in 11/19 showed EF up to 35%, diffuse hypokinesis.  Echo in 11/20 showed EF up to 45-50% with normal RV.   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   Echo in 2/22 showed EF 55%, normal RV size and systolic function, 4.5 cm aortic root. Echo in 4/23 showed EF 50-55%, normal RV, ascending aorta 4.3 cm.  Echo in 9/24 showed EF 50-55% with mild LV dilation, normal RV size/systolic function, IVC normal.   Today he returns for followup of CHF.  Weight is down 2 lbs.  He is going to be starting CPAP. He is trying to get a GLP-1 agonist through the [place]. Main complaint is low back pain/sciatica.  Not very active, but denies exertional dyspnea or chest pain. No palpitations or lightheadedness.   Labs (7/19): BNP 43, K 4.1, creatinine 0.91 Labs (11/19): K 4.2, creatinine 0.82 Labs (2/20): K 4.1, creatinine 1.11 Labs (1/21): K 4, creatinine 0.86 Labs (9/21): K 4.5, creatinine 0.72 Labs (8/22): K 4.5, creatinine 0.79 Labs (4/23): K 4, creatinine 0.73 Labs (10/23): BNP 11.6, K 4.3, creatinine 0.7 Labs (4/24): LDL 76, K 4.1, creatinine 1.61 Labs (2/25): LDL 73, K 4.3, creatinine 0.96  ECG (personally reviewed): NSR, 1st degree AVB, LAFB, septal Qs  PMH: 1. Chronic systolic CHF: Nonischemic cardiomyopathy.  - LHC/RHC (7/19): No significant coronary disease.  Mean RA 13, PA 44/19, mean PCWP 31, CI 3.48.  - Cardiac MRI (7/19): EF 18%, mild-moderately decreased RV systolic function, ascending aorta 4.4 cm.  - Echo (11/19):  EF 35%, diffuse hypokinesis, mild LVH, aortic root 4.3 cm, normal RV size and systolic function.  - Echo (11/20): EF 45-50%, mild LV dilation, normal RV size and systolic function.  - Echo (2/22): EF 55%, normal RV, 4.5 cm aortic root.  - Echo (4/23): EF 50-55%, normal RV, ascending aorta 4.3 cm - Echo (9/24): EF 50-55% with mild LV dilation, normal RV size/systolic function, IVC normal. 2. Dilated aortic root/ascending aorta: 4.4 cm on cardiac MRI 7/19.  - MRA chest (11/20): 4.4 cm aortic root, 4.1 cm ascending aorta.  - Echo (2/22): 4.5 cm aortic root.  - MRA (3/22): 4.5 cm aortic root.  - MRA (1/24): 4 cm ascending aorta 3. OSA: Severe on 8/19 sleep study. Has not been using CPAP.   4. Cirrhosis: Viral hepatitis labs negative, no history of heavy ETOH. ?NAFLD.  5. Nephrolithiasis.  6. 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   ROS: All systems negative except as listed in HPI, PMH and Problem List.  Social History   Socioeconomic History   Marital status: Divorced    Spouse name: Not on file   Number of children: Not on file   Years of education: Not on file   Highest education level: Not on file  Occupational History   Not on file  Tobacco Use   Smoking status: Former    Current packs/day: 0.00    Average packs/day: 1 pack/day for 50.0 years (50.0 ttl pk-yrs)  Types: Cigarettes    Start date: 58    Quit date: 04/04/2016    Years since quitting: 7.2   Smokeless tobacco: Never  Vaping Use   Vaping status: Former  Substance and Sexual Activity   Alcohol use: Yes    Comment: social   Drug use: Never    Comment: remotely in the 1970's   Sexual activity: Not on file  Other Topics Concern   Not on file  Social History Narrative   Not on file   Social Drivers of Health   Financial Resource Strain: Low Risk  (06/17/2021)   Overall Financial Resource Strain (CARDIA)    Difficulty of Paying Living Expenses: Not hard at all  Food Insecurity: No  Food Insecurity (05/25/2023)   Hunger Vital Sign    Worried About Running Out of Food in the Last Year: Never true    Ran Out of Food in the Last Year: Never true  Transportation Needs: No Transportation Needs (05/25/2023)   PRAPARE - Administrator, Civil Service (Medical): No    Lack of Transportation (Non-Medical): No  Physical Activity: Not on file  Stress: Not on file  Social Connections: Not on file  Intimate Partner Violence: Not At Risk (05/25/2023)   Humiliation, Afraid, Rape, and Kick questionnaire    Fear of Current or Ex-Partner: No    Emotionally Abused: No    Physically Abused: No    Sexually Abused: No    Family History  Problem Relation Age of Onset   Dementia Mother    Heart disease Father    Lung cancer Brother        SCLC    Current Outpatient Medications  Medication Sig Dispense Refill   acetaminophen (TYLENOL) 500 MG tablet Take 500 mg by mouth every 6 (six) hours as needed for moderate pain or headache.      aspirin 81 MG chewable tablet Chew 81 mg by mouth 2 (two) times daily.     carvedilol (COREG) 6.25 MG tablet TAKE 1 TABLET BY MOUTH TWICE A DAY 180 tablet 3   cetirizine (ZYRTEC) 10 MG tablet Take 10 mg by mouth as needed for allergies.     ENTRESTO 49-51 MG TAKE 1 TABLET BY MOUTH TWICE A DAY 60 tablet 11   famotidine (PEPCID) 20 MG tablet Take 20 mg by mouth 2 (two) times daily.     fluticasone (FLONASE) 50 MCG/ACT nasal spray Place 1 spray into both nostrils daily.     furosemide (LASIX) 40 MG tablet TAKE 1 TABLET BY MOUTH EVERY DAY 90 tablet 2   spironolactone (ALDACTONE) 25 MG tablet TAKE 1 TABLET (25 MG TOTAL) BY MOUTH DAILY. 90 tablet 3   No current facility-administered medications for this encounter.    Vitals:   06/28/23 1047  BP: 100/60  Pulse: 71  SpO2: 93%  Weight: 130.3 kg (287 lb 3.2 oz)   Wt Readings from Last 3 Encounters:  06/28/23 130.3 kg (287 lb 3.2 oz)  05/25/23 128.8 kg (284 lb)  01/30/23 127.9 kg (282 lb)     PHYSICAL EXAM: General: NAD Neck: No JVD, no thyromegaly or thyroid nodule.  Lungs: Clear to auscultation bilaterally with normal respiratory effort. CV: Nondisplaced PMI.  Heart regular S1/S2, no S3/S4, no murmur.  1+ ankle edema.  No carotid bruit.  Normal pedal pulses.  Abdomen: Soft, nontender, no hepatosplenomegaly, no distention.  Skin: Intact without lesions or rashes.  Neurologic: Alert and oriented x 3.  Psych: Normal affect.  Extremities: No clubbing or cyanosis.  HEENT: Normal.   ASSESSMENT & PLAN: 1. Chronic systolic CHF: Echo 7/19 with EF 15-20%.  Nonischemic cardiomyopathy, no significant CAD on coronary angiography in 7/19.  No strong family history of cardiomyopathy.  No ETOH or drug abuse.  Cardiac MRI with EF 18%, no LGE (7/19).  RHC (7/19) with preserved cardiac output and elevated filling pressures. Possible viral myocarditis.  Echo in 11/19 showed EF up to 35%.  Echo in 11/20 showed EF 45-50%.  Echo in 2/22 showed EF up to 55%. Echo in 4/23 with EF 50-55%, normal RV, ascending aorta 4.3 cm. Echo in 9/24 showed EF 50-55% with mild LV dilation, normal RV size/systolic function, IVC normal.  NYHA class II, not volume overloaded on exam.  - Continue Lasix 40 daily.  BMET/BNP today.  - Continue spironolactone 25 mg daily.  - Continue Entresto 49/51 bid  - Continue Coreg 6.25 mg bid  - He did not tolerate [place]. 2. Cirrhosis: Noted on CTA. Viral hepatitis labs negative.  No history of heavy ETOH. ?NAFLD.  Liver MRI showed cirrhosis and liver cysts.   - Check LFTs today.  3. OSA: Severe.  Continue CPAP.    4. Dilated aortic root/ascending aorta: MRA 3/22 showed 4.5 cm aortic root. MRA 1/24 with 4 cm ascending aorta.  - I will arrange for MRA chest to assess ascending aorta/aortic root this year.  5. Obesity: He is trying to work through the [place] to get semaglutide or tirzepatide.  If this fails, we will try through our pharmacy clinic.    Followup 6 months with APP.   I  spent 32 minutes reviewing records, interviewing/examining patient, and managing orders.   Marca Ancona  06/28/2023

## 2023-07-21 ENCOUNTER — Other Ambulatory Visit (HOSPITAL_COMMUNITY): Payer: Self-pay | Admitting: Cardiology

## 2023-07-22 ENCOUNTER — Other Ambulatory Visit (HOSPITAL_COMMUNITY): Payer: Self-pay | Admitting: Cardiology

## 2023-07-26 ENCOUNTER — Ambulatory Visit

## 2023-07-26 VITALS — BP 110/67 | HR 71 | Wt 284.0 lb

## 2023-07-26 DIAGNOSIS — Z0000 Encounter for general adult medical examination without abnormal findings: Secondary | ICD-10-CM

## 2023-07-26 NOTE — Patient Instructions (Signed)
 David Castaneda , Thank you for taking time to come for your Medicare Wellness Visit. I appreciate your ongoing commitment to your health goals. Please review the following plan we discussed and let me know if I can assist you in the future.   Referrals/Orders/Follow-Ups/Clinician Recommendations: Please remember to get your pneumonia vaccine done the next time you see your provider.   This is a list of the screening recommended for you and due dates:  Health Maintenance  Topic Date Due   Pneumonia Vaccine (1 of 2 - PCV) Never done   Colon Cancer Screening  Never done   Screening for Lung Cancer  05/24/2024*   COVID-19 Vaccine (4 - 2024-25 season) 08/10/2024*   Flu Shot  11/03/2023   Medicare Annual Wellness Visit  07/25/2024   DTaP/Tdap/Td vaccine (2 - Td or Tdap) 02/18/2032   Hepatitis C Screening  Completed   Zoster (Shingles) Vaccine  Completed   HPV Vaccine  Aged Out   Meningitis B Vaccine  Aged Out  *Topic was postponed. The date shown is not the original due date.    Advanced directives: (Declined) Advance directive discussed with you today. Even though you declined this today, please call our office should you change your mind, and we can give you the proper paperwork for you to fill out.  Next Medicare Annual Wellness Visit scheduled for next year: Yes

## 2023-07-26 NOTE — Progress Notes (Signed)
 Subjective:   David Castaneda is a 73 y.o. who presents for a Medicare Wellness preventive visit.  Visit Complete: Virtual I connected with  Yoshi Lantigua on 07/26/23 by a audio enabled telemedicine application and verified that I am speaking with the correct person using two identifiers.  Patient Location: Home  Provider Location: Home Office  I discussed the limitations of evaluation and management by telemedicine. The patient expressed understanding and agreed to proceed.  Vital Signs: Because this visit was a virtual/telehealth visit, some criteria may be missing or patient reported. Any vitals not documented were not able to be obtained and vitals that have been documented are patient reported.  VideoDeclined- This patient declined Librarian, academic. Therefore the visit was completed with audio only.  Persons Participating in Visit: Patient.  AWV Questionnaire: No: Patient Medicare AWV questionnaire was not completed prior to this visit.  Cardiac Risk Factors include: advanced age (>73men, >64 women);obesity (BMI >30kg/m2);male gender (Congestive heart failure (CHF) (HCC), SOB)     Objective:    Today's Vitals   07/26/23 1003  BP: 110/67  Pulse: 71  Weight: 284 lb (128.8 kg)   Body mass index is 43.18 kg/m.     07/26/2023   10:10 AM 01/30/2023    8:14 PM 04/09/2019    9:05 AM 03/26/2019    9:41 AM 03/15/2019    6:27 AM 02/16/2018    8:38 PM 11/25/2017    3:47 AM  Advanced Directives  Does Patient Have a Medical Advance Directive? No No Yes Yes Yes Yes Yes  Type of Surveyor, minerals;Living will Healthcare Power of [place];Living will Healthcare Power of [place];Living will Living will Healthcare Power of [place];Living will  Does patient want to make changes to medical advance directive?     No - Guardian declined No - Patient declined   Copy of Healthcare Power of Attorney in Chart?    No - copy requested    No - copy requested  Would patient like information on creating a medical advance directive?  No - Patient declined         Current Medications (verified) Outpatient Encounter Medications as of 07/26/2023  Medication Sig   acetaminophen  (TYLENOL ) 500 MG tablet Take 500 mg by mouth every 6 (six) hours as needed for moderate pain or headache.    aspirin  81 MG chewable tablet Chew 81 mg by mouth 2 (two) times daily.   carvedilol  (COREG ) 6.25 MG tablet TAKE 1 TABLET BY MOUTH TWICE A DAY   cetirizine (ZYRTEC) 10 MG tablet Take 10 mg by mouth as needed for allergies.   ENTRESTO  49-51 MG TAKE 1 TABLET BY MOUTH TWICE A DAY   famotidine  (PEPCID ) 20 MG tablet Take 20 mg by mouth 2 (two) times daily.   fluticasone (FLONASE) 50 MCG/ACT nasal spray Place 1 spray into both nostrils daily.   furosemide  (LASIX ) 40 MG tablet TAKE 1 TABLET (40 MG TOTAL) BY MOUTH EVERY MORNING AND 0.5 TABLETS (20 MG TOTAL) EVERY EVENING.   spironolactone  (ALDACTONE ) 25 MG tablet TAKE 1 TABLET (25 MG TOTAL) BY MOUTH DAILY.   No facility-administered encounter medications on file as of 07/26/2023.    Allergies (verified) Patient has no known allergies.   History: Past Medical History:  Diagnosis Date   CHF (congestive heart failure) (HCC)    Former smoker    History of kidney stones    OSA (obstructive sleep apnea) 06/04/2018   Severe obstructive sleep apnea  with an AHI of 93/h with oxygen desaturations as low as 81%. Now on CPAP at 11 cm H2O.    Sucking chest wound    1970's [place]   Past Surgical History:  Procedure Laterality Date   CARDIAC CATHETERIZATION     patient states they went in and looked around but never placed a stent   DENTAL SURGERY     MICROLARYNGOSCOPY WITH CO2 LASER AND EXCISION OF VOCAL CORD LESION N/A 03/15/2019   Procedure: MICROLARYNGOSCOPY WITH POSSIBLE CO2 LASER AND EXCISION OF VOCAL CORD LESION;  Surgeon: Prescott Brodie, MD;  Location: [facility] OR;  Service: ENT;  Laterality: N/A;    MICROLARYNGOSCOPY WITH CO2 LASER AND EXCISION OF VOCAL CORD LESION N/A 04/12/2019   Procedure: Microlaryngoscopy With Co2 Laser And Excision Of Vocal Cord;  Surgeon: Prescott Brodie, MD;  Location: [facility] OR;  Service: ENT;  Laterality: N/A;   wound     wound in back in war   Family History  Problem Relation Age of Onset   Dementia Mother    Heart disease Father    Lung cancer Brother        SCLC   Social History   Socioeconomic History   Marital status: Divorced    Spouse name: Not on file   Number of children: Not on file   Years of education: Not on file   Highest education level: Not on file  Occupational History   Not on file  Tobacco Use   Smoking status: Former    Current packs/day: 0.00    Average packs/day: 1 pack/day for 50.0 years (50.0 ttl pk-yrs)    Types: Cigarettes    Start date: 11    Quit date: 04/04/2016    Years since quitting: 7.3   Smokeless tobacco: Never  Vaping Use   Vaping status: Former  Substance and Sexual Activity   Alcohol use: Yes    Comment: social   Drug use: Never    Comment: remotely in the 1970's   Sexual activity: Not on file  Other Topics Concern   Not on file  Social History Narrative   Not on file   Social Drivers of Health   Financial Resource Strain: Low Risk  (07/26/2023)   Overall Financial Resource Strain (CARDIA)    Difficulty of Paying Living Expenses: Not hard at all  Food Insecurity: No Food Insecurity (07/26/2023)   Hunger Vital Sign    Worried About Running Out of Food in the Last Year: Never true    Ran Out of Food in the Last Year: Never true  Transportation Needs: No Transportation Needs (07/26/2023)   PRAPARE - Administrator, Civil Service (Medical): No    Lack of Transportation (Non-Medical): No  Physical Activity: Insufficiently Active (07/26/2023)   Exercise Vital Sign    Days of Exercise per Week: 5 days    Minutes of Exercise per Session: 10 min  Stress: No Stress Concern Present  (07/26/2023)   [employer] of Occupational Health - Occupational Stress Questionnaire    Feeling of Stress : Not at all  Social Connections: Socially Isolated (07/26/2023)   Social Connection and Isolation Panel [NHANES]    Frequency of Communication with Friends and Family: More than three times a week    Frequency of Social Gatherings with Friends and Family: More than three times a week    Attends Religious Services: Never    Database administrator or Organizations: No    Attends [employer]  or Organization Meetings: Never    Marital Status: Divorced    Tobacco Counseling Counseling given: Yes    Clinical Intake:  Pre-visit preparation completed: Yes  Pain : No/denies pain     BMI - recorded: 43.18 Nutritional Status: BMI > 30  Obese Nutritional Risks: None Diabetes: No  Lab Results  Component Value Date   HGBA1C 5.0 08/01/2022   HGBA1C 5.4 01/20/2022   HGBA1C 5.3 10/14/2017     How often do you need to have someone help you when you read instructions, pamphlets, or other written materials from your doctor or pharmacy?: 1 - Never  Interpreter Needed?: No  Information entered by :: Alia T/cma   Activities of Daily Living     07/26/2023   10:11 AM  In your present state of health, do you have any difficulty performing the following activities:  Hearing? 0  Vision? 0  Difficulty concentrating or making decisions? 0  Walking or climbing stairs? 0  Dressing or bathing? 0  Doing errands, shopping? 1  Comment pt's lady friend takes him to and from dr office  Preparing Food and eating ? N  Using the Toilet? N  In the past six months, have you accidently leaked urine? N  Do you have problems with loss of bowel control? N  Managing your Medications? N  Managing your Finances? N  Housekeeping or managing your Housekeeping? N    Patient Care Team: Milian, Winda Hastings, FNP as PCP - General (Family Medicine) Jacqueline Matsu, MD as PCP - Sleep Medicine (Sleep  Medicine) Darlis Eisenmenger, MD as PCP - Advanced Heart Failure (Cardiology)  Indicate any recent Medical Services you may have received from other than [facility] providers in the past year (date may be approximate).     Assessment:   This is a routine wellness examination for David Castaneda.  Hearing/Vision screen Hearing Screening - Comments:: Pt uses hearing Vision Screening - Comments:: Pt wear glasses  Pt goes to the [place]   Goals Addressed             This Visit's Progress    Patient Stated       Pt would like to start pottery again       Depression Screen     07/26/2023   10:28 AM 05/25/2023   11:55 AM  PHQ 2/9 Scores  PHQ - 2 Score 0 0  PHQ- 9 Score 0 2    Fall Risk     07/26/2023   10:28 AM 05/25/2023   11:56 AM  Fall Risk   Falls in the past year? 1 1  Number falls in past yr: 0 1  Injury with Fall? 1 0  Risk for fall due to : Impaired balance/gait;Impaired mobility Impaired balance/gait;Impaired mobility  Follow up Falls prevention discussed;Falls evaluation completed Falls evaluation completed    MEDICARE RISK AT HOME:  Medicare Risk at Home Any stairs in or around the home?: Yes If so, are there any without handrails?: Yes Home free of loose throw rugs in walkways, pet beds, electrical cords, etc?: No Adequate lighting in your home to reduce risk of falls?: Yes Life alert?: Yes Use of a cane, walker or w/c?: Yes (pt uses a cane) Grab bars in the bathroom?: Yes Shower chair or bench in shower?: No Elevated toilet seat or a handicapped toilet?: Yes  TIMED UP AND GO:  Was the test performed?  no  Cognitive Function: 6CIT completed        07/26/2023  10:25 AM  6CIT Screen  What Year? 0 points  What month? 0 points  What time? 0 points  Count back from 20 0 points  Months in reverse 0 points  Repeat phrase 0 points  Total Score 0 points    Immunizations Immunization History  Administered Date(s) Administered   Fluad Quad(high Dose 65+) 04/29/2022    MODERNA COVID-19 SARS-COV-2 PEDS BIVALENT BOOSTER 48yr-62yr 06/06/2019, 07/12/2019, 02/11/2020   Moderna SARS-COV2 Booster Vaccination 08/14/2020, 02/17/2022, 02/20/2023   Respiratory Syncytial Virus Vaccine,Recomb Aduvanted(Arexvy) 05/19/2022   Tdap 02/17/2022   Zoster Recombinant(Shingrix ) 08/31/2022, 05/25/2023    Screening Tests Health Maintenance  Topic Date Due   Pneumonia Vaccine 63+ Years old (1 of 2 - PCV) Never done   Colonoscopy  Never done   Lung Cancer Screening  05/24/2024 (Originally 04/06/2023)   COVID-19 Vaccine (4 - 2024-25 season) 08/10/2024 (Originally 04/17/2023)   INFLUENZA VACCINE  11/03/2023   Medicare Annual Wellness (AWV)  07/25/2024   DTaP/Tdap/Td (2 - Td or Tdap) 02/18/2032   Hepatitis C Screening  Completed   Zoster Vaccines- Shingrix   Completed   HPV VACCINES  Aged Out   Meningococcal B Vaccine  Aged Out    Health Maintenance  Health Maintenance Due  Topic Date Due   Pneumonia Vaccine 60+ Years old (1 of 2 - PCV) Never done   Colonoscopy  Never done   Health Maintenance Items Addressed: See Nurse Notes  Additional Screening:  Vision Screening: Recommended annual ophthalmology exams for early detection of glaucoma and other disorders of the eye.  Dental Screening: Recommended annual dental exams for proper oral hygiene  Community Resource Referral / Chronic Care Management: CRR required this visit?  No   CCM required this visit?  No     Plan:     I have personally reviewed and noted the following in the patient's chart:   Medical and social history Use of alcohol, tobacco or illicit drugs  Current medications and supplements including opioid prescriptions. Patient is not currently taking opioid prescriptions. Functional ability and status Nutritional status Physical activity Advanced directives List of other physicians Hospitalizations, surgeries, and ER visits in previous 12 months Vitals Screenings to include cognitive,  depression, and falls Referrals and appointments  In addition, I have reviewed and discussed with patient certain preventive protocols, quality metrics, and best practice recommendations. A written personalized care plan for preventive services as well as general preventive health recommendations were provided to patient.     Michaelle Adolphus, CMA   07/26/2023   After Visit Summary: (MyChart) Due to this being a telephonic visit, the after visit summary with patients personalized plan was offered to patient via MyChart   Notes:  pt is aware that he is due for pneumonia vaccine and [facility] per pt.

## 2023-08-01 ENCOUNTER — Encounter: Payer: Self-pay | Admitting: *Deleted

## 2023-10-03 ENCOUNTER — Other Ambulatory Visit (HOSPITAL_COMMUNITY): Payer: Self-pay | Admitting: Cardiology

## 2023-10-16 ENCOUNTER — Ambulatory Visit (HOSPITAL_COMMUNITY)

## 2023-10-20 ENCOUNTER — Ambulatory Visit (HOSPITAL_COMMUNITY)

## 2023-11-22 ENCOUNTER — Ambulatory Visit: Admitting: Family Medicine

## 2023-11-22 ENCOUNTER — Ambulatory Visit: Payer: Medicare Other | Admitting: Family Medicine

## 2023-11-22 ENCOUNTER — Encounter: Payer: Self-pay | Admitting: Family Medicine

## 2023-11-22 DIAGNOSIS — I7121 Aneurysm of the ascending aorta, without rupture: Secondary | ICD-10-CM | POA: Diagnosis not present

## 2023-11-22 DIAGNOSIS — I5022 Chronic systolic (congestive) heart failure: Secondary | ICD-10-CM

## 2023-11-22 DIAGNOSIS — Z8521 Personal history of malignant neoplasm of larynx: Secondary | ICD-10-CM

## 2023-11-22 DIAGNOSIS — M5441 Lumbago with sciatica, right side: Secondary | ICD-10-CM

## 2023-11-22 DIAGNOSIS — G8929 Other chronic pain: Secondary | ICD-10-CM

## 2023-11-22 DIAGNOSIS — K7460 Unspecified cirrhosis of liver: Secondary | ICD-10-CM | POA: Diagnosis not present

## 2023-11-22 DIAGNOSIS — R17 Unspecified jaundice: Secondary | ICD-10-CM

## 2023-11-22 DIAGNOSIS — M5442 Lumbago with sciatica, left side: Secondary | ICD-10-CM

## 2023-11-22 LAB — BAYER DCA HB A1C WAIVED: HB A1C (BAYER DCA - WAIVED): 5.1 % (ref 4.8–5.6)

## 2023-11-22 MED ORDER — GABAPENTIN 300 MG PO CAPS: 300.0000 mg | ORAL_CAPSULE | Freq: Three times a day (TID) | ORAL | 3 refills | Status: DC

## 2023-11-22 NOTE — Progress Notes (Signed)
 Established Patient Office Visit  Subjective   Patient ID: David Castaneda, male    DOB: December 05, 1950  Age: 73 y.o. MRN: [id number]  Chief Complaint  Patient presents with   Medical Management of Chronic Issues    HPI  HTN/CHF Complaint with meds - Yes Current Medications - coreg  BID, lasix , entresto , aldactone  Pertinent ROS:  Chest pain - No Dyspnea - Yes, baseline Palpitations - No LE edema - Yes, baseline  Established with cardiology for management. He also has 4 cm thoracic aortic aneurysm. Missed appt with repeat imaging. [facility].   2. Weight Has tried to get GLP through [facility] but has been unable to get coverage.   3. Cirrhosis Patient reports that he was unaware of dx. Dx in 2019 per MRI of liver while in hospital. Per MRI report in 2019:  IMPRESSION: 1. Morphologic features a liver compatible with cirrhosis. Stigmata of portal venous hypertension noted including esophageal and gastric varices. 2. Multiple liver cysts. 3. Gallstone 4. There is dilatation of the bile ducts to the lateral segment of left lobe of liver. Which may reflect mass effect upon the central bile ducts from centrally located liver cyst. No underlying enhancing liver lesion identified. Suggest follow-up imaging in 6 months with contrast enhanced MR I/MRCP to ensure stability of this focal abnormality and to rule out underlying malignant neoplasm obstructing the bile ducts to the left lobe.  Partial imaging on CTA in 2024 showed hepatic cysts. He does not see a specialist for his liver. [facility] Denies abdominal pain, nausea, vomiting, ascites, weight loss, jaundice. He does have a hx of vocal cord, laryngeal cancer that is in remission. Cardiology checked LFTs 4 months ago. Bilirubin was 2.5 at the time, albumin 3.2, AST/ALT were WNL. He does not drink alcohol and has not had significant alcohol intake in the  past.    4. Chronic back pain with sciatica.  For years. Hx of prior injury. Takes tylenol  as needed without relief. Intermittent tingling in feet if he has swelling in his feet. Uses cane to assist with ambulation. Denies saddle anesthesia, changes in bowel or bladder control. Would like something to help with pain. He is not interested in opiates.     Past Medical History:  Diagnosis Date   CHF (congestive heart failure) (HCC)    Former smoker    History of kidney stones    OSA (obstructive sleep apnea) 06/04/2018   Severe obstructive sleep apnea with an AHI of 93/h with oxygen desaturations as low as 81%. Now on CPAP at 11 cm H2O.    Sucking chest wound    1970's [place]      ROS As per HPI.    Objective:     BP (!) 102/56   Pulse 69   Temp (!) 97.5 F (36.4 C) (Temporal)   Ht 5' 8 (1.727 m)   Wt 286 lb 6.4 oz (129.9 kg)   SpO2 96%   BMI 43.55 kg/m  Wt Readings from Last 3 Encounters:  11/22/23 286 lb 6.4 oz (129.9 kg)  07/26/23 284 lb (128.8 kg)  06/28/23 287 lb 3.2 oz (130.3 kg)      Physical Exam Vitals and nursing note reviewed.  Constitutional:      General: He is not in acute distress.    Appearance: He is obese. He is not ill-appearing, toxic-appearing or diaphoretic.  Eyes:  General: No scleral icterus. Cardiovascular:     Rate and Rhythm: Normal rate and regular rhythm.     Heart sounds: Normal heart sounds. No murmur heard. Pulmonary:     Effort: Pulmonary effort is normal. No respiratory distress.     Breath sounds: Normal breath sounds. No wheezing, rhonchi or rales.  Abdominal:     General: Bowel sounds are normal. There is no distension.     Palpations: Abdomen is soft.     Tenderness: There is no abdominal tenderness. There is no right CVA tenderness, left CVA tenderness, guarding or rebound.  Musculoskeletal:     Right lower leg: No edema.     Left lower leg: No edema.  Skin:    General: Skin is warm and dry.     Coloration: Skin is  not jaundiced.  Neurological:     Mental Status: He is alert and oriented to person, place, and time.     Gait: Gait abnormal (using cane).  Psychiatric:        Mood and Affect: Mood normal.        Behavior: Behavior normal.      No results found for any visits on 11/22/23.    The ASCVD Risk score (Arnett DK, et al., 2019) failed to calculate for the following reasons:   The valid total cholesterol range is 130 to 320 mg/dL    Assessment & Plan:   Morbid (severe) obesity due to excess calories (HCC) Diet, exercise as tolerated. Will check labs as below.  -     CBC with Differential/Platelet -     CMP14+EGFR -     TSH -     Bayer DCA Hb A1c Waived  Cirrhosis of liver without ascites, unspecified hepatic cirrhosis type [facility]) Discussed finding on imaging with patient. ? NAFLD as etiology. Will check labs as below. He declined referral today but said that [facility]. Discussed importance of follow up for this.  -     CBC with Differential/Platelet -     CMP14+EGFR  Elevated bilirubin Denies symptoms.  -     CMP14+EGFR  Ascending aortic aneurysm, unspecified whether ruptured [facility]) Continue surveillance with cardiology.   Chronic systolic congestive heart failure (HCC) Euvolemic today. Continue follow up with cardiology.  History of laryngeal cancer In remission.   Chronic bilateral low back pain with bilateral sciatica No red flags. Will try gabapentin . Titration discussed. Notify for side effects.  -     Gabapentin ; Take 1 capsule (300 mg total) by mouth 3 (three) times daily.  Dispense: 90 capsule; Refill: 3     Return in about 6 weeks (around 01/03/2024) for gabapentin  follow up.   The patient indicates understanding of these issues and agrees with the plan.  Total time spent caring for the patient today was 52 minutes. This includes time spent before the visit reviewing the chart, time spent during the visit, and time spent after  the visit on documentation.  Annabella CHRISTELLA Search, FNP

## 2023-11-22 NOTE — Patient Instructions (Signed)
 Start gabapentin  300 mg at bedtime for 1 week. Then increase to 300 mg twice a day for a week. You can then increase 300 mg three times day.

## 2023-11-23 ENCOUNTER — Ambulatory Visit: Payer: Self-pay | Admitting: Family Medicine

## 2023-11-23 LAB — CMP14+EGFR
ALT: 26 IU/L (ref 0–44)
AST: 46 IU/L — ABNORMAL HIGH (ref 0–40)
Albumin: 3.5 g/dL — ABNORMAL LOW (ref 3.8–4.8)
Alkaline Phosphatase: 97 IU/L (ref 44–121)
BUN/Creatinine Ratio: 10 (ref 10–24)
BUN: 7 mg/dL — ABNORMAL LOW (ref 8–27)
Bilirubin Total: 2.3 mg/dL — ABNORMAL HIGH (ref 0.0–1.2)
CO2: 20 mmol/L (ref 20–29)
Calcium: 8.9 mg/dL (ref 8.6–10.2)
Chloride: 106 mmol/L (ref 96–106)
Creatinine, Ser: 0.7 mg/dL — ABNORMAL LOW (ref 0.76–1.27)
Globulin, Total: 2.4 g/dL (ref 1.5–4.5)
Glucose: 108 mg/dL — ABNORMAL HIGH (ref 70–99)
Potassium: 3.9 mmol/L (ref 3.5–5.2)
Sodium: 140 mmol/L (ref 134–144)
Total Protein: 5.9 g/dL — ABNORMAL LOW (ref 6.0–8.5)
eGFR: 96 mL/min/1.73

## 2023-11-23 LAB — CBC WITH DIFFERENTIAL/PLATELET
Basophils Absolute: 0.1 x10E3/uL (ref 0.0–0.2)
Basos: 1 %
EOS (ABSOLUTE): 0.3 x10E3/uL (ref 0.0–0.4)
Eos: 5 %
Hematocrit: 39.1 % (ref 37.5–51.0)
Hemoglobin: 13.3 g/dL (ref 13.0–17.7)
Immature Grans (Abs): 0 x10E3/uL (ref 0.0–0.1)
Immature Granulocytes: 0 %
Lymphocytes Absolute: 1 x10E3/uL (ref 0.7–3.1)
Lymphs: 18 %
MCH: 32.5 pg (ref 26.6–33.0)
MCHC: 34 g/dL (ref 31.5–35.7)
MCV: 96 fL (ref 79–97)
Monocytes Absolute: 0.8 x10E3/uL (ref 0.1–0.9)
Monocytes: 15 %
Neutrophils Absolute: 3.5 x10E3/uL (ref 1.4–7.0)
Neutrophils: 61 %
Platelets: 200 x10E3/uL (ref 150–450)
RBC: 4.09 x10E6/uL — ABNORMAL LOW (ref 4.14–5.80)
RDW: 13.2 % (ref 11.6–15.4)
WBC: 5.6 x10E3/uL (ref 3.4–10.8)

## 2023-11-23 LAB — TSH: TSH: 0.952 u[IU]/mL (ref 0.450–4.500)

## 2023-12-29 ENCOUNTER — Encounter (HOSPITAL_COMMUNITY)

## 2024-01-11 ENCOUNTER — Encounter (HOSPITAL_COMMUNITY)

## 2024-01-26 NOTE — Progress Notes (Incomplete)
 PCP: Cathlene Marry Lenis, FNP Primary Cardiologist: Dr Rolan   HPI: David Castaneda is a 73 y.o. with history of NICM, smoking, cirrhosis, and systolic heart failure.   Admitted 10/13/2017 with increase SOB/CP. HF team consulted. RHC/LHC as noted below with normal cors and adequate cardiac output. Cardiac MRI showed EF 18%.  SBP was soft so no BB was added. Placed on low dose losartan  and spiro. Discharge weight 260 pounds.   Echo in 11/19 showed EF up to 35%, diffuse hypokinesis.  Echo in 11/20 showed EF up to 45-50% with normal RV.   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   Echo in 2/22 showed EF 55%, normal RV size and systolic function, 4.5 cm aortic root. Echo in 4/23 showed EF 50-55%, normal RV, ascending aorta 4.3 cm.  Echo in 9/24 showed EF 50-55% with mild LV dilation, normal RV size/systolic function, IVC normal.   Today he returns for followup of CHF.  Weight is down 2 lbs.  He is going to be starting CPAP. He is trying to get a GLP-1 agonist through the [place]. Main complaint is low back pain/sciatica.  Not very active, but denies exertional dyspnea or chest pain. No palpitations or lightheadedness.   Labs (7/19): BNP 43, K 4.1, creatinine 0.91 Labs (11/19): K 4.2, creatinine 0.82 Labs (2/20): K 4.1, creatinine 1.11 Labs (1/21): K 4, creatinine 0.86 Labs (9/21): K 4.5, creatinine 0.72 Labs (8/22): K 4.5, creatinine 0.79 Labs (4/23): K 4, creatinine 0.73 Labs (10/23): BNP 11.6, K 4.3, creatinine 0.7 Labs (4/24): LDL 76, K 4.1, creatinine 9.30 Labs (2/25): LDL 73, K 4.3, creatinine 9.41  ECG (personally reviewed): NSR, 1st degree AVB, LAFB, septal Qs  PMH: 1. Chronic systolic CHF: Nonischemic cardiomyopathy.  - LHC/RHC (7/19): No significant coronary disease.  Mean RA 13, PA 44/19, mean PCWP 31, CI 3.48.  - Cardiac MRI (7/19): EF 18%, mild-moderately decreased RV systolic function, ascending aorta 4.4 cm.  - Echo (11/19):  EF 35%, diffuse hypokinesis, mild LVH, aortic root 4.3 cm, normal RV size and systolic function.  - Echo (11/20): EF 45-50%, mild LV dilation, normal RV size and systolic function.  - Echo (2/22): EF 55%, normal RV, 4.5 cm aortic root.  - Echo (4/23): EF 50-55%, normal RV, ascending aorta 4.3 cm - Echo (9/24): EF 50-55% with mild LV dilation, normal RV size/systolic function, IVC normal. 2. Dilated aortic root/ascending aorta: 4.4 cm on cardiac MRI 7/19.  - MRA chest (11/20): 4.4 cm aortic root, 4.1 cm ascending aorta.  - Echo (2/22): 4.5 cm aortic root.  - MRA (3/22): 4.5 cm aortic root.  - MRA (1/24): 4 cm ascending aorta 3. OSA: Severe on 8/19 sleep study. Has not been using CPAP.   4. Cirrhosis: Viral hepatitis labs negative, no history of heavy ETOH. ?NAFLD.  5. Nephrolithiasis.  6. 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   ROS: All systems negative except as listed in HPI, PMH and Problem List.  Social History   Socioeconomic History   Marital status: Divorced    Spouse name: Not on file   Number of children: Not on file   Years of education: Not on file   Highest education level: Not on file  Occupational History   Not on file  Tobacco Use   Smoking status: Former    Current packs/day: 0.00    Average packs/day: 1 pack/day for 50.0 years (50.0 ttl pk-yrs)  Types: Cigarettes    Start date: 60    Quit date: 04/04/2016    Years since quitting: 7.8   Smokeless tobacco: Never  Vaping Use   Vaping status: Former  Substance and Sexual Activity   Alcohol use: Yes    Comment: social   Drug use: Never    Comment: remotely in the 1970's   Sexual activity: Not Currently  Other Topics Concern   Not on file  Social History Narrative   Not on file   Social Drivers of Health   Financial Resource Strain: Low Risk  (07/26/2023)   Overall Financial Resource Strain (CARDIA)    Difficulty of Paying Living Expenses: Not hard at all  Food Insecurity: No  Food Insecurity (07/26/2023)   Hunger Vital Sign    Worried About Running Out of Food in the Last Year: Never true    Ran Out of Food in the Last Year: Never true  Transportation Needs: No Transportation Needs (07/26/2023)   PRAPARE - Administrator, Civil Service (Medical): No    Lack of Transportation (Non-Medical): No  Physical Activity: Insufficiently Active (07/26/2023)   Exercise Vital Sign    Days of Exercise per Week: 5 days    Minutes of Exercise per Session: 10 min  Stress: No Stress Concern Present (07/26/2023)   [employer] of Occupational Health - Occupational Stress Questionnaire    Feeling of Stress : Not at all  Social Connections: Socially Isolated (07/26/2023)   Social Connection and Isolation Panel    Frequency of Communication with Friends and Family: More than three times a week    Frequency of Social Gatherings with Friends and Family: More than three times a week    Attends Religious Services: Never    Database administrator or Organizations: No    Attends Banker Meetings: Never    Marital Status: Divorced  Catering manager Violence: Not At Risk (07/26/2023)   Humiliation, Afraid, Rape, and Kick questionnaire    Fear of Current or Ex-Partner: No    Emotionally Abused: No    Physically Abused: No    Sexually Abused: No    Family History  Problem Relation Age of Onset   Dementia Mother    Heart disease Father    Lung cancer Brother        SCLC    Current Outpatient Medications  Medication Sig Dispense Refill   acetaminophen  (TYLENOL ) 500 MG tablet Take 500 mg by mouth every 6 (six) hours as needed for moderate pain or headache.      aspirin  81 MG chewable tablet Chew 81 mg by mouth 2 (two) times daily.     carvedilol  (COREG ) 6.25 MG tablet TAKE 1 TABLET BY MOUTH TWICE A DAY 180 tablet 3   cetirizine (ZYRTEC) 10 MG tablet Take 10 mg by mouth as needed for allergies.     famotidine  (PEPCID ) 20 MG tablet Take 20 mg by mouth 2  (two) times daily.     fluticasone (FLONASE) 50 MCG/ACT nasal spray Place 1 spray into both nostrils daily.     furosemide  (LASIX ) 40 MG tablet TAKE 1 TABLET (40 MG TOTAL) BY MOUTH EVERY MORNING AND 0.5 TABLETS (20 MG TOTAL) EVERY EVENING. 135 tablet 3   gabapentin  (NEURONTIN ) 300 MG capsule Take 1 capsule (300 mg total) by mouth 3 (three) times daily. 90 capsule 3   sacubitril -valsartan  (ENTRESTO ) 49-51 MG TAKE 1 TABLET BY MOUTH TWICE A DAY 60 tablet 5  spironolactone  (ALDACTONE ) 25 MG tablet TAKE 1 TABLET (25 MG TOTAL) BY MOUTH DAILY. 90 tablet 3   No current facility-administered medications for this visit.    There were no vitals filed for this visit.  Wt Readings from Last 3 Encounters:  11/22/23 129.9 kg (286 lb 6.4 oz)  07/26/23 128.8 kg (284 lb)  06/28/23 130.3 kg (287 lb 3.2 oz)    PHYSICAL EXAM: General: NAD Neck: No JVD, no thyromegaly or thyroid nodule.  Lungs: Clear to auscultation bilaterally with normal respiratory effort. CV: Nondisplaced PMI.  Heart regular S1/S2, no S3/S4, no murmur.  1+ ankle edema.  No carotid bruit.  Normal pedal pulses.  Abdomen: Soft, nontender, no hepatosplenomegaly, no distention.  Skin: Intact without lesions or rashes.  Neurologic: Alert and oriented x 3.  Psych: Normal affect. Extremities: No clubbing or cyanosis.  HEENT: Normal.   ASSESSMENT & PLAN: 1. Chronic systolic CHF: Echo 7/19 with EF 15-20%.  Nonischemic cardiomyopathy, no significant CAD on coronary angiography in 7/19.  No strong family history of cardiomyopathy.  No ETOH or drug abuse.  Cardiac MRI with EF 18%, no LGE (7/19).  RHC (7/19) with preserved cardiac output and elevated filling pressures. Possible viral myocarditis.  Echo in 11/19 showed EF up to 35%.  Echo in 11/20 showed EF 45-50%.  Echo in 2/22 showed EF up to 55%. Echo in 4/23 with EF 50-55%, normal RV, ascending aorta 4.3 cm. Echo in 9/24 showed EF 50-55% with mild LV dilation, normal RV size/systolic function,  IVC normal.  NYHA class II, not volume overloaded on exam.  - Continue Lasix  40 daily.  BMET/BNP today.  - Continue spironolactone  25 mg daily.  - Continue Entresto  49/51 bid  - Continue Coreg  6.25 mg bid  - He did not tolerate Farxiga . 2. Cirrhosis: Noted on CTA. Viral hepatitis labs negative.  No history of heavy ETOH. ?NAFLD.  Liver MRI showed cirrhosis and liver cysts.   - Check LFTs today.  3. OSA: Severe.  Continue CPAP.    4. Dilated aortic root/ascending aorta: MRA 3/22 showed 4.5 cm aortic root. MRA 1/24 with 4 cm ascending aorta.  - I will arrange for MRA chest to assess ascending aorta/aortic root this year.  5. Obesity: He is trying to work through the [place] to get semaglutide or tirzepatide.  If this fails, we will try through our pharmacy clinic.    Followup 6 months with APP.   I spent 32 minutes reviewing records, interviewing/examining patient, and managing orders.   Harlene HERO [facility]  01/26/2024

## 2024-01-29 ENCOUNTER — Other Ambulatory Visit (HOSPITAL_COMMUNITY): Payer: Self-pay | Admitting: Cardiology

## 2024-01-30 ENCOUNTER — Telehealth (HOSPITAL_COMMUNITY): Payer: Self-pay

## 2024-01-30 NOTE — Telephone Encounter (Signed)
 Called to confirm/remind patient of their appointment [facility] on 01/31/24.   Appointment:   [] Confirmed  [x] Left mess   [] No answer/No voice mail  [] VM Full/unable to leave message  [] Phone not in service  And to bring in all medications and/or complete list.

## 2024-01-31 ENCOUNTER — Encounter (HOSPITAL_COMMUNITY)

## 2024-02-08 ENCOUNTER — Ambulatory Visit: Payer: Self-pay

## 2024-02-08 NOTE — Telephone Encounter (Signed)
 FYI Only or Action Required?: FYI only for provider: appointment scheduled on 02/14/2024 for transfer of care. Patient was recommended to be seen tomorrow for an acute appointment but patient refused..  Patient was last seen in primary care on 11/22/2023 by Joesph Annabella HERO, FNP.  Called Nurse Triage reporting Diarrhea and Leg Swelling.  Symptoms began several weeks ago.  Interventions attempted: Rest, hydration, or home remedies.  Symptoms are: unchanged.  Triage Disposition: See HCP Within 4 Hours (Or PCP Triage), See Physician Within 24 Hours  Patient/caregiver understands and will follow disposition?: No, wishes to speak with PCP   Reason for Triage: Uncontrolled diarrhea   Patient reports several episodes of diarrhea for the last several weeks. Patient reports moderate leg swelling per patient. Called to speak to emergency contract Kim per patient's request. Luke states patient's legs are weeping fluid to the point that he is having to put towels down. Luke states she will call patient and see if he will agree to be seen tomorrow in the office for an acute visit.   Reason for Disposition  [1] MODERATE diarrhea (e.g., 4-6 times / day more than normal) AND [2] age > 70 years  SEVERE leg swelling (e.g., swelling extends above knee, entire leg is swollen, weeping fluid)  Answer Assessment - Initial Assessment Questions 1. DIARRHEA SEVERITY: How bad is the diarrhea? How many more stools have you had in the past 24 hours than normal?      6 times a day at the minimum 2. ONSET: When did the diarrhea begin?      Several weeks 3. STOOL DESCRIPTION:  How loose or watery is the diarrhea? What is the stool color? Is there any blood or mucous in the stool?     Loose and watery at times but can be formed 4. VOMITING: Are you also vomiting? If Yes, ask: How many times in the past 24 hours?      no 5. ABDOMEN PAIN: Are you having any abdomen pain? If Yes, ask: What does it  feel like? (e.g., crampy, dull, intermittent, constant)      no 6. ABDOMEN PAIN SEVERITY: If present, ask: How bad is the pain?  (e.g., Scale 1-10; mild, moderate, or severe)     no 7. ORAL INTAKE: If vomiting, Have you been able to drink liquids? How much liquids have you had in the past 24 hours?     Drinking well 8. HYDRATION: Any signs of dehydration? (e.g., dry mouth [not just dry lips], too weak to stand, dizziness, new weight loss) When did you last urinate?     At times 9. EXPOSURE: Have you traveled to a foreign country recently? Have you been exposed to anyone with diarrhea? Could you have eaten any food that was spoiled?     no 10. ANTIBIOTIC USE: Are you taking antibiotics now or have you taken antibiotics in the past 2 months?       no 11. OTHER SYMPTOMS: Do you have any other symptoms? (e.g., fever, blood in stool)       no  Answer Assessment - Initial Assessment Questions 1. ONSET: When did the swelling start? (e.g., minutes, hours, days)     Going on for the last several weeks 2. LOCATION: What part of the leg is swollen?  Are both legs swollen or just one leg?     Both legs 3. SEVERITY: How bad is the swelling? (e.g., localized; mild, moderate, severe)     moderate 4. REDNESS: Is  there redness or signs of infection?     yes 5. PAIN: Is the swelling painful to touch? If Yes, ask: How painful is it?   (Scale 1-10; mild, moderate or severe)     Yes-moderate 6. FEVER: Do you have a fever? If Yes, ask: What is it, how was it measured, and when did it start?      no 7. CAUSE: What do you think is causing the leg swelling?     unsure 8. MEDICAL HISTORY: Do you have a history of blood clots (e.g., DVT), cancer, heart failure, kidney disease, or liver failure?     Heart failure 9. RECURRENT SYMPTOM: Have you had leg swelling before? If Yes, ask: When was the last time? What happened that time?     yes 10. OTHER SYMPTOMS: Do  you have any other symptoms? (e.g., chest pain, difficulty breathing)       no  Protocols used: Diarrhea-A-AH, Leg Swelling and Edema-A-AH

## 2024-02-08 NOTE — Telephone Encounter (Signed)
Noted  -LS

## 2024-02-10 ENCOUNTER — Inpatient Hospital Stay (HOSPITAL_COMMUNITY): Admit: 2024-02-10 | Admitting: Internal Medicine

## 2024-02-10 ENCOUNTER — Encounter (HOSPITAL_COMMUNITY): Payer: Self-pay

## 2024-02-14 ENCOUNTER — Encounter: Admitting: Family Medicine

## 2024-02-26 ENCOUNTER — Telehealth (HOSPITAL_COMMUNITY): Payer: Self-pay | Admitting: Cardiology

## 2024-02-26 NOTE — Telephone Encounter (Signed)
 notified by Luke Mutters care giver/ friend pt died last night ICU in [place].  Chart updated Message to provider as RICK

## 2024-03-04 DEATH — deceased
